# Patient Record
Sex: Male | Born: 1950 | Race: White | Hispanic: No | Marital: Single | Smoking: Never smoker
Health system: Southern US, Community
[De-identification: ages and names within clinical notes are randomized; demographics above are authoritative.]

## PROBLEM LIST (undated history)

## (undated) DIAGNOSIS — H5440 Blindness, one eye, unspecified eye: Secondary | ICD-10-CM

## (undated) DIAGNOSIS — C4491 Basal cell carcinoma of skin, unspecified: Secondary | ICD-10-CM

## (undated) DIAGNOSIS — C801 Malignant (primary) neoplasm, unspecified: Secondary | ICD-10-CM

## (undated) DIAGNOSIS — E119 Type 2 diabetes mellitus without complications: Secondary | ICD-10-CM

## (undated) HISTORY — PX: CHOLECYSTECTOMY: SHX55

## (undated) HISTORY — PX: TONSILLECTOMY: SUR1361

## (undated) HISTORY — DX: Basal cell carcinoma of skin, unspecified: C44.91

---

## 2003-06-13 ENCOUNTER — Other Ambulatory Visit: Payer: Self-pay

## 2004-02-27 ENCOUNTER — Ambulatory Visit: Payer: Self-pay | Admitting: *Deleted

## 2004-08-24 ENCOUNTER — Emergency Department: Payer: Self-pay | Admitting: Emergency Medicine

## 2005-01-03 ENCOUNTER — Emergency Department: Payer: Self-pay | Admitting: Emergency Medicine

## 2005-04-27 ENCOUNTER — Emergency Department: Payer: Self-pay | Admitting: Emergency Medicine

## 2005-12-15 ENCOUNTER — Emergency Department: Payer: Self-pay | Admitting: Emergency Medicine

## 2008-06-24 ENCOUNTER — Ambulatory Visit: Payer: Self-pay | Admitting: Gastroenterology

## 2009-05-29 DIAGNOSIS — D099 Carcinoma in situ, unspecified: Secondary | ICD-10-CM

## 2009-05-29 HISTORY — DX: Carcinoma in situ, unspecified: D09.9

## 2011-10-06 ENCOUNTER — Emergency Department: Payer: Self-pay | Admitting: Internal Medicine

## 2011-10-06 LAB — URINALYSIS, COMPLETE
Blood: NEGATIVE
Glucose,UR: NEGATIVE mg/dL (ref 0–75)
Hyaline Cast: 3
Leukocyte Esterase: NEGATIVE
Ph: 5 (ref 4.5–8.0)
RBC,UR: 5 /HPF (ref 0–5)
Specific Gravity: 1.026 (ref 1.003–1.030)
Squamous Epithelial: NONE SEEN
WBC UR: 2 /HPF (ref 0–5)

## 2011-10-06 LAB — COMPREHENSIVE METABOLIC PANEL
Albumin: 3.6 g/dL (ref 3.4–5.0)
Alkaline Phosphatase: 61 U/L (ref 50–136)
Anion Gap: 10 (ref 7–16)
BUN: 16 mg/dL (ref 7–18)
Bilirubin,Total: 0.9 mg/dL (ref 0.2–1.0)
Chloride: 102 mmol/L (ref 98–107)
Co2: 29 mmol/L (ref 21–32)
Glucose: 109 mg/dL — ABNORMAL HIGH (ref 65–99)
Potassium: 3.2 mmol/L — ABNORMAL LOW (ref 3.5–5.1)
SGPT (ALT): 84 U/L — ABNORMAL HIGH

## 2011-10-06 LAB — DRUG SCREEN, URINE
Amphetamines, Ur Screen: NEGATIVE (ref ?–1000)
Barbiturates, Ur Screen: NEGATIVE (ref ?–200)
Benzodiazepine, Ur Scrn: NEGATIVE (ref ?–200)
Cannabinoid 50 Ng, Ur ~~LOC~~: NEGATIVE (ref ?–50)
Cocaine Metabolite,Ur ~~LOC~~: NEGATIVE (ref ?–300)
MDMA (Ecstasy)Ur Screen: NEGATIVE (ref ?–500)
Opiate, Ur Screen: NEGATIVE (ref ?–300)
Phencyclidine (PCP) Ur S: NEGATIVE (ref ?–25)
Tricyclic, Ur Screen: NEGATIVE (ref ?–1000)

## 2011-10-06 LAB — CK TOTAL AND CKMB (NOT AT ARMC): CK, Total: 1166 U/L — ABNORMAL HIGH (ref 35–232)

## 2011-10-06 LAB — CBC
HCT: 39.4 % — ABNORMAL LOW (ref 40.0–52.0)
HGB: 13.3 g/dL (ref 13.0–18.0)
MCH: 30.2 pg (ref 26.0–34.0)
MCV: 90 fL (ref 80–100)
Platelet: 270 10*3/uL (ref 150–440)
RDW: 15.1 % — ABNORMAL HIGH (ref 11.5–14.5)

## 2011-10-06 LAB — SALICYLATE LEVEL: Salicylates, Serum: <1.7 mg/dL

## 2011-10-06 LAB — ETHANOL: Ethanol: <3 mg/dL

## 2011-10-06 LAB — PROTIME-INR: INR: 1

## 2011-10-06 LAB — ACETAMINOPHEN LEVEL: Acetaminophen: <2 ug/mL

## 2011-12-22 ENCOUNTER — Emergency Department: Payer: Self-pay | Admitting: *Deleted

## 2012-04-30 ENCOUNTER — Emergency Department: Payer: Self-pay | Admitting: Emergency Medicine

## 2012-04-30 LAB — URINALYSIS, COMPLETE
Bilirubin,UR: NEGATIVE
Blood: NEGATIVE
Leukocyte Esterase: NEGATIVE
Nitrite: NEGATIVE
Ph: 5 (ref 4.5–8.0)
Protein: NEGATIVE
RBC,UR: 6 /HPF (ref 0–5)
Specific Gravity: 1.027 (ref 1.003–1.030)
Squamous Epithelial: NONE SEEN

## 2012-04-30 LAB — LIPASE, BLOOD: Lipase: 28 U/L — ABNORMAL LOW (ref 73–393)

## 2012-04-30 LAB — COMPREHENSIVE METABOLIC PANEL
Alkaline Phosphatase: 84 U/L (ref 50–136)
Anion Gap: 7 (ref 7–16)
BUN: 15 mg/dL (ref 7–18)
Bilirubin,Total: 0.8 mg/dL (ref 0.2–1.0)
Calcium, Total: 9.2 mg/dL (ref 8.5–10.1)
Chloride: 106 mmol/L (ref 98–107)
Co2: 25 mmol/L (ref 21–32)
Creatinine: 1.21 mg/dL (ref 0.60–1.30)
EGFR (Non-African Amer.): >60
Osmolality: 277 (ref 275–301)
Potassium: 4 mmol/L (ref 3.5–5.1)
Sodium: 138 mmol/L (ref 136–145)

## 2012-04-30 LAB — CBC
HCT: 40.1 % (ref 40.0–52.0)
HGB: 14 g/dL (ref 13.0–18.0)
Platelet: 247 10*3/uL (ref 150–440)
RBC: 4.45 10*6/uL (ref 4.40–5.90)
RDW: 14.5 % (ref 11.5–14.5)

## 2012-07-19 LAB — COMPREHENSIVE METABOLIC PANEL
Anion Gap: 10 (ref 7–16)
BUN: 14 mg/dL (ref 7–18)
Co2: 24 mmol/L (ref 21–32)
Creatinine: 0.93 mg/dL (ref 0.60–1.30)
EGFR (African American): >60
EGFR (Non-African Amer.): >60
Osmolality: 279 (ref 275–301)
Potassium: 3.6 mmol/L (ref 3.5–5.1)
SGOT(AST): 37 U/L (ref 15–37)
SGPT (ALT): 40 U/L (ref 12–78)
Sodium: 139 mmol/L (ref 136–145)

## 2012-07-19 LAB — URINALYSIS, COMPLETE
Bacteria: NONE SEEN
Bilirubin,UR: NEGATIVE
Glucose,UR: NEGATIVE mg/dL (ref 0–75)
Ketone: NEGATIVE
Leukocyte Esterase: NEGATIVE
RBC,UR: <1 /HPF (ref 0–5)
Specific Gravity: 1.017 (ref 1.003–1.030)
WBC UR: 1 /HPF (ref 0–5)

## 2012-07-19 LAB — ETHANOL: Ethanol: <3 mg/dL

## 2012-07-19 LAB — CBC
HCT: 41.5 % (ref 40.0–52.0)
HGB: 13.9 g/dL (ref 13.0–18.0)
MCH: 31 pg (ref 26.0–34.0)
Platelet: 292 10*3/uL (ref 150–440)
RBC: 4.47 10*6/uL (ref 4.40–5.90)
WBC: 13 10*3/uL — ABNORMAL HIGH (ref 3.8–10.6)

## 2012-07-19 LAB — DRUG SCREEN, URINE
Amphetamines, Ur Screen: NEGATIVE (ref ?–1000)
Barbiturates, Ur Screen: NEGATIVE (ref ?–200)
Benzodiazepine, Ur Scrn: NEGATIVE (ref ?–200)
Methadone, Ur Screen: NEGATIVE (ref ?–300)
Opiate, Ur Screen: NEGATIVE (ref ?–300)

## 2012-07-19 LAB — TSH: Thyroid Stimulating Horm: 1.43 u[IU]/mL

## 2012-07-20 ENCOUNTER — Inpatient Hospital Stay: Payer: Self-pay | Admitting: Psychiatry

## 2012-08-01 ENCOUNTER — Emergency Department: Payer: Self-pay | Admitting: Internal Medicine

## 2013-10-24 ENCOUNTER — Emergency Department: Payer: Self-pay | Admitting: Emergency Medicine

## 2013-10-24 LAB — CBC
HCT: 47.7 % (ref 40.0–52.0)
HGB: 15.9 g/dL (ref 13.0–18.0)
MCH: 31.1 pg (ref 26.0–34.0)
MCHC: 33.4 g/dL (ref 32.0–36.0)
MCV: 93 fL (ref 80–100)
PLATELETS: 208 10*3/uL (ref 150–440)
RBC: 5.12 10*6/uL (ref 4.40–5.90)
RDW: 13.6 % (ref 11.5–14.5)
WBC: 8.9 10*3/uL (ref 3.8–10.6)

## 2013-10-24 LAB — COMPREHENSIVE METABOLIC PANEL
ALT: 45 U/L (ref 12–78)
Albumin: 3.8 g/dL (ref 3.4–5.0)
Alkaline Phosphatase: 65 U/L
Anion Gap: 6 — ABNORMAL LOW (ref 7–16)
BUN: 21 mg/dL — AB (ref 7–18)
Bilirubin,Total: 0.7 mg/dL (ref 0.2–1.0)
CO2: 26 mmol/L (ref 21–32)
Calcium, Total: 8.8 mg/dL (ref 8.5–10.1)
Chloride: 110 mmol/L — ABNORMAL HIGH (ref 98–107)
Creatinine: 1.11 mg/dL (ref 0.60–1.30)
EGFR (African American): >60
EGFR (Non-African Amer.): >60
Glucose: 104 mg/dL — ABNORMAL HIGH (ref 65–99)
OSMOLALITY: 286 (ref 275–301)
Potassium: 3.8 mmol/L (ref 3.5–5.1)
SGOT(AST): 36 U/L (ref 15–37)
Sodium: 142 mmol/L (ref 136–145)
TOTAL PROTEIN: 7.1 g/dL (ref 6.4–8.2)

## 2013-10-24 LAB — TROPONIN I: Troponin-I: <0.02 ng/mL

## 2013-10-24 LAB — ETHANOL
Ethanol %: <0.003 % (ref 0.000–0.080)
Ethanol: <3 mg/dL

## 2013-10-25 LAB — URINALYSIS, COMPLETE
BILIRUBIN, UR: NEGATIVE
BLOOD: NEGATIVE
GLUCOSE, UR: NEGATIVE mg/dL (ref 0–75)
Leukocyte Esterase: NEGATIVE
Nitrite: NEGATIVE
PH: 5 (ref 4.5–8.0)
Protein: 30
Specific Gravity: 1.035 (ref 1.003–1.030)
Squamous Epithelial: <1
WBC UR: 3 /HPF (ref 0–5)

## 2013-10-25 LAB — DRUG SCREEN, URINE

## 2013-10-28 ENCOUNTER — Emergency Department: Payer: Self-pay | Admitting: Emergency Medicine

## 2013-10-28 LAB — CBC
HCT: 44.1 % (ref 40.0–52.0)
HGB: 15.1 g/dL (ref 13.0–18.0)
MCH: 31.8 pg (ref 26.0–34.0)
MCHC: 34.2 g/dL (ref 32.0–36.0)
MCV: 93 fL (ref 80–100)
Platelet: 189 10*3/uL (ref 150–440)
RBC: 4.74 10*6/uL (ref 4.40–5.90)
RDW: 13.3 % (ref 11.5–14.5)
WBC: 6.7 10*3/uL (ref 3.8–10.6)

## 2013-10-28 LAB — COMPREHENSIVE METABOLIC PANEL
ALK PHOS: 63 U/L
ANION GAP: 7 (ref 7–16)
AST: 34 U/L (ref 15–37)
Albumin: 3.5 g/dL (ref 3.4–5.0)
BUN: 20 mg/dL — ABNORMAL HIGH (ref 7–18)
Bilirubin,Total: 0.6 mg/dL (ref 0.2–1.0)
CHLORIDE: 108 mmol/L — AB (ref 98–107)
Calcium, Total: 8.4 mg/dL — ABNORMAL LOW (ref 8.5–10.1)
Co2: 25 mmol/L (ref 21–32)
Creatinine: 1.17 mg/dL (ref 0.60–1.30)
EGFR (Non-African Amer.): >60
GLUCOSE: 100 mg/dL — AB (ref 65–99)
Osmolality: 282 (ref 275–301)
Potassium: 3.9 mmol/L (ref 3.5–5.1)
SGPT (ALT): 36 U/L (ref 12–78)
Sodium: 140 mmol/L (ref 136–145)
Total Protein: 6.6 g/dL (ref 6.4–8.2)

## 2013-10-28 LAB — PROTIME-INR
INR: 1
PROTHROMBIN TIME: 13.1 s (ref 11.5–14.7)

## 2013-10-28 LAB — ETHANOL
Ethanol %: <0.003 % (ref 0.000–0.080)
Ethanol: <3 mg/dL

## 2013-10-28 LAB — APTT: ACTIVATED PTT: 29.6 s (ref 23.6–35.9)

## 2013-10-28 LAB — TROPONIN I: Troponin-I: <0.02 ng/mL

## 2013-10-31 ENCOUNTER — Emergency Department: Payer: Self-pay | Admitting: Emergency Medicine
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
## 2014-09-18 NOTE — Discharge Summary (Signed)
PATIENT NAME:  Bradley Griffin, Bradley Griffin MR#:  [id number] DATE OF BIRTH:  24-Feb-1951  DATE OF ADMISSION:  07/20/2012 DATE OF DISCHARGE:  07/24/2012  HOSPITAL COURSE: See dictated history and physical for details of admission. This 64 year old man with a history of bipolar disorder with a fairly recent history of manic symptoms came into the hospital with at least hypomania. He had been off of his previously prescribed Risperdal and had noticed that he was starting to have euphoric mood and racing thoughts that he characterized as typical of when he was manic before. In the hospital, the patient had mildly pressured speech, euphoric affect, was somewhat intrusive and hyperverbal but was not threatening or hostile or suicidal. He was willing to take Risperdal but only 2 mg a day, which had been the previous dose prescribed when he had been manic before. The patient showed good tolerance of this medicine and gradually had at least partial improvement of his symptoms. Multiple times I met with him to educate him about the nature of bipolar disorder. I suggested to him that he ought to be on a more typical mood stabilizer and that he probably ought to be on a higher dose of the Risperdal. The patient rejected the idea of a higher dose of Risperdal but was ultimately agreeable to at least trying to start Depakote. Depakote 500 mg at night was initiated prior to discharge. During his hospital stay, the patient did not voice any suicidal ideation, was not aggressive or violent. He does not appear to be responding to internal stimuli, does not appear to be paranoid. He was completely in agreement with continuing to follow up with Dr. Timmothy Euler as an outpatient.   LABORATORY AND RADIOLOGICAL RESULTS: Drug screen negative. TSH 1.4, which is normal. Alcohol undetected. Chemistry normal except for a slightly high glucose at 118. CBC showed a white count elevated at 13; otherwise, CBC was normal. Urinalysis unremarkable. CT scan of the  head showed no evidence of infarct, ischemia or mass. Mild age-related white matter changes.   DISCHARGE MEDICATIONS: Risperdal 1 mg twice a day and Depakote 500 mg at night.   MENTAL STATUS EXAMINATION AT DISCHARGE: Somewhat slovenly dressed, not very well groomed man who looks his stated age or younger. Cooperative with the interview. Good eye contact. Speech normal in rate, tone and volume, if anything slightly increased. Affect was jovial and upbeat. Mood stated as good. Thoughts appear to be slightly scattered, but not bizarre. No grotesquely delusional statements made. Denied auditory or visual hallucinations. Denied suicidal or homicidal ideation. Showed at least adequate judgment and insight. Agreed to outpatient treatment. Intelligence normal. Short and long-term memory grossly intact.   DISPOSITION: He is discharged home where he lives on his own and will follow up with [place].   DIAGNOSES, PRINCIPAL AND PRIMARY:  AXIS I: Bipolar disorder type I, currently manic with partial remission.  AXIS II: Deferred.  AXIS III: Obesity.  AXIS IV: Moderate. Chronic stress from lack of social support.  AXIS V: Functioning at time of discharge: 60.    ____________________________ Gonzella Lex, MD jtc:jm D: 07/24/2012 16:23:10 ET T: 07/24/2012 19:51:03 ET JOB#: [id number]  cc: Gonzella Lex, MD, <Dictator> Gonzella Lex MD ELECTRONICALLY SIGNED 07/25/2012 16:38
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
## 2018-09-19 ENCOUNTER — Observation Stay
Admission: EM | Admit: 2018-09-19 | Discharge: 2018-09-20 | Disposition: A | Discharge status: Home / Self Care | Payer: Medicaid Other | Attending: Internal Medicine | Admitting: Internal Medicine

## 2018-09-19 ENCOUNTER — Other Ambulatory Visit: Payer: Self-pay

## 2018-09-19 ENCOUNTER — Encounter: Payer: Self-pay | Admitting: Emergency Medicine

## 2018-09-19 ENCOUNTER — Emergency Department: Payer: Medicaid Other

## 2018-09-19 DIAGNOSIS — Z66 Do not resuscitate: Secondary | ICD-10-CM | POA: Insufficient documentation

## 2018-09-19 DIAGNOSIS — R2681 Unsteadiness on feet: Secondary | ICD-10-CM | POA: Diagnosis not present

## 2018-09-19 DIAGNOSIS — E876 Hypokalemia: Secondary | ICD-10-CM | POA: Diagnosis not present

## 2018-09-19 DIAGNOSIS — Z85828 Personal history of other malignant neoplasm of skin: Secondary | ICD-10-CM | POA: Diagnosis not present

## 2018-09-19 DIAGNOSIS — M6281 Muscle weakness (generalized): Principal | ICD-10-CM | POA: Insufficient documentation

## 2018-09-19 DIAGNOSIS — R531 Weakness: Secondary | ICD-10-CM | POA: Diagnosis present

## 2018-09-19 DIAGNOSIS — E785 Hyperlipidemia, unspecified: Secondary | ICD-10-CM

## 2018-09-19 DIAGNOSIS — W19XXXA Unspecified fall, initial encounter: Secondary | ICD-10-CM | POA: Insufficient documentation

## 2018-09-19 DIAGNOSIS — E1165 Type 2 diabetes mellitus with hyperglycemia: Secondary | ICD-10-CM | POA: Diagnosis not present

## 2018-09-19 DIAGNOSIS — E1169 Type 2 diabetes mellitus with other specified complication: Secondary | ICD-10-CM

## 2018-09-19 DIAGNOSIS — Z9181 History of falling: Secondary | ICD-10-CM | POA: Diagnosis not present

## 2018-09-19 DIAGNOSIS — I959 Hypotension, unspecified: Secondary | ICD-10-CM | POA: Insufficient documentation

## 2018-09-19 HISTORY — DX: Malignant (primary) neoplasm, unspecified: C80.1

## 2018-09-19 LAB — CBC WITH DIFFERENTIAL/PLATELET
Abs Immature Granulocytes: 0.03 10*3/uL (ref 0.00–0.07)
Basophils Absolute: 0 10*3/uL (ref 0.0–0.1)
Basophils Relative: 0 %
Eosinophils Absolute: 0.1 10*3/uL (ref 0.0–0.5)
Eosinophils Relative: 2 %
HCT: 43.7 % (ref 39.0–52.0)
Hemoglobin: 15.2 g/dL (ref 13.0–17.0)
Immature Granulocytes: 0 %
Lymphocytes Relative: 16 %
Lymphs Abs: 1.5 10*3/uL (ref 0.7–4.0)
MCH: 31.6 pg (ref 26.0–34.0)
MCHC: 34.8 g/dL (ref 30.0–36.0)
MCV: 90.9 fL (ref 80.0–100.0)
Monocytes Absolute: 0.5 10*3/uL (ref 0.1–1.0)
Monocytes Relative: 6 %
Neutro Abs: 7.2 10*3/uL (ref 1.7–7.7)
Neutrophils Relative %: 76 %
Platelets: 197 10*3/uL (ref 150–400)
RBC: 4.81 MIL/uL (ref 4.22–5.81)
RDW: 13.1 % (ref 11.5–15.5)
WBC: 9.4 10*3/uL (ref 4.0–10.5)
nRBC: 0 % (ref 0.0–0.2)

## 2018-09-19 LAB — TROPONIN I: Troponin I: <0.03 ng/mL (ref <0.03)

## 2018-09-19 LAB — COMPREHENSIVE METABOLIC PANEL
ALT: 39 U/L (ref 0–44)
AST: 50 U/L — ABNORMAL HIGH (ref 15–41)
Albumin: 3.6 g/dL (ref 3.5–5.0)
Alkaline Phosphatase: 51 U/L (ref 38–126)
Anion gap: 17 — ABNORMAL HIGH (ref 5–15)
BUN: 11 mg/dL (ref 8–23)
CO2: 22 mmol/L (ref 22–32)
Calcium: 8.5 mg/dL — ABNORMAL LOW (ref 8.9–10.3)
Chloride: 98 mmol/L (ref 98–111)
Creatinine, Ser: 0.81 mg/dL (ref 0.61–1.24)
GFR calc Af Amer: >60 mL/min (ref >60)
GFR calc non Af Amer: >60 mL/min (ref >60)
Glucose, Bld: 318 mg/dL — ABNORMAL HIGH (ref 70–99)
Potassium: 3.3 mmol/L — ABNORMAL LOW (ref 3.5–5.1)
Sodium: 137 mmol/L (ref 135–145)
Total Bilirubin: 1.2 mg/dL (ref 0.3–1.2)
Total Protein: 6.7 g/dL (ref 6.5–8.1)

## 2018-09-19 LAB — BLOOD GAS, VENOUS
Acid-Base Excess: 7 mmol/L — ABNORMAL HIGH (ref 0.0–2.0)
Bicarbonate: 30.2 mmol/L — ABNORMAL HIGH (ref 20.0–28.0)
O2 Saturation: 25.4 %
Patient temperature: 37
pCO2, Ven: 37 mmHg — ABNORMAL LOW (ref 44.0–60.0)
pH, Ven: 7.52 — ABNORMAL HIGH (ref 7.250–7.430)
pO2, Ven: <31 mmHg — CL (ref 32.0–45.0)

## 2018-09-19 LAB — CK: Total CK: 91 U/L (ref 49–397)
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
## 2018-09-19 NOTE — ED Notes (Signed)
Date and time results received: 09/19/18 10:51 PM  (use smartphrase ".now" to insert current time)  Test: blood gas  Critical Value: ph;7.52 ; CO2 37; PO2 15; HCO3 30.2   Name of Provider Notified: Kinner MD   Orders Received? Or Actions Taken?: Actions Taken: MD Corky Downs notified of critical results

## 2018-09-19 NOTE — ED Notes (Signed)
Pet pt, weakness has been gradually getting worse within the last couple of weeks.

## 2018-09-19 NOTE — H&P (Signed)
[place] [facility] [place] NAME: Bradley Griffin    MR#:  [id number]  DATE OF BIRTH:  12/10/1950  DATE OF ADMISSION:  09/19/2018  PRIMARY CARE PHYSICIAN: Patient, No Pcp Per   REQUESTING/REFERRING PHYSICIAN: Lavonia Drafts, MD  CHIEF COMPLAINT:   Chief Complaint  Patient presents with  . Fall    HISTORY OF PRESENT ILLNESS:  Bradley Griffin  is a 68 y.o. male with a known history of skin cancer who presented to the ED with a fall and generalized weakness.  He states that his "feet got tripped up" today and he fell backwards.  He landed on his bottom.  He denies any head trauma or loss of consciousness.  He states that he has had generalized weakness over the last 3 weeks.  He is having a more difficult time going from sitting to standing.  He states he thinks this is because he has not been exercising as much.  He denies any chest pain, shortness of breath, abdominal pain.  In the ED, vitals were unremarkable.  Labs were significant for K3.3, anion gap 17.  Chest x-ray was unremarkable.  Due to his inability to walk in the emergency department, hospitalists were called for admission.  PAST MEDICAL HISTORY:   Past Medical History:  Diagnosis Date  . Cancer [facility])    Skin cancer     PAST SURGICAL HISTORY:   Past Surgical History:  Procedure Laterality Date  . CHOLECYSTECTOMY      SOCIAL HISTORY:   Social History   Tobacco Use  . Smoking status: Never Smoker  . Smokeless tobacco: Never Used  Substance Use Topics  . Alcohol use: Never    Frequency: Never    FAMILY HISTORY:  Mother-heart disease  DRUG ALLERGIES:  Not on File  REVIEW OF SYSTEMS:   Review of Systems  Constitutional: Negative for chills and fever.  HENT: Negative for congestion and sore throat.   Eyes: Negative for blurred vision and double vision.  Respiratory: Negative for cough and shortness of breath.   Cardiovascular: Negative for chest pain and  palpitations.  Gastrointestinal: Negative for nausea and vomiting.  Genitourinary: Negative for dysuria and urgency.  Musculoskeletal: Positive for falls. Negative for back pain and neck pain.  Neurological: Positive for weakness. Negative for dizziness, focal weakness and headaches.  Psychiatric/Behavioral: Negative for depression. The patient is not nervous/anxious.     MEDICATIONS AT HOME:   Prior to Admission medications   Not on File      VITAL SIGNS:  Blood pressure (!) 134/95, pulse 81, temperature 98 F (36.7 C), temperature source Oral, resp. rate 17, height 5' 9.5" (1.765 m), weight (!) 199.6 kg, SpO2 100 %.  PHYSICAL EXAMINATION:  Physical Exam  GENERAL:  68 y.o.-year-old patient lying in the bed with no acute distress.  EYES: Pupils equal, round, reactive to light and accommodation. No scleral icterus. Extraocular muscles intact.  HEENT: Head atraumatic, normocephalic. Oropharynx and nasopharynx clear.  NECK:  Supple, no jugular venous distention. No thyroid enlargement, no tenderness.  LUNGS: Normal breath sounds bilaterally, no wheezing, rales,rhonchi or crepitation. No use of accessory muscles of respiration.  CARDIOVASCULAR: RRR, S1, S2 normal. No murmurs, rubs, or gallops.  ABDOMEN: Soft, nontender, nondistended. Bowel sounds present. No organomegaly or mass.  EXTREMITIES: No pedal edema, cyanosis, or clubbing.  NEUROLOGIC: Cranial nerves II through XII are intact. +global weakness. Sensation intact. Gait not checked.  PSYCHIATRIC: The patient is alert and oriented x  3.  SKIN: No obvious rash. +chronic venous stasis changes in the lower extremities. +skin cancer located on right temple. See picture below.     LABORATORY PANEL:   CBC Recent Labs  Lab 09/19/18 2134  WBC 9.4  HGB 15.2  HCT 43.7  PLT 197   ------------------------------------------------------------------------------------------------------------------  Chemistries  Recent Labs  Lab  09/19/18 2134  NA 137  K 3.3*  CL 98  CO2 22  GLUCOSE 318*  BUN 11  CREATININE 0.81  CALCIUM 8.5*  AST 50*  ALT 39  ALKPHOS 51  BILITOT 1.2   ------------------------------------------------------------------------------------------------------------------  Cardiac Enzymes Recent Labs  Lab 09/19/18 2134  TROPONINI <0.03   ------------------------------------------------------------------------------------------------------------------  RADIOLOGY:  Dg Chest Port 1 View  Result Date: 09/19/2018 CLINICAL DATA:  Weakness EXAM: PORTABLE CHEST 1 VIEW COMPARISON:  10/28/2013 FINDINGS: The heart size and mediastinal contours are within normal limits. Both lungs are clear. The visualized skeletal structures are unremarkable. IMPRESSION: No active disease. Electronically Signed   By: Ulyses Jarred M.D.   On: 09/19/2018 22:01    IMPRESSION AND PLAN:   Generalized weakness- likely secondary to deconditioning. No focal deficits to suggest a stroke. -PT consult -Gentle IVFs  Hyperglycemia- no diagnosis of diabetes -Check A1c -SSI  Hypokalemia- K 3.3 -Replete and recheck  Skin cancer- has had skin cancers excised on his forehead before. Has not seen a dermatologist in while due to financial issues. -Needs to f/u with dermatology as an outpatient.  All the records are reviewed and case discussed with ED provider. Management plans discussed with the patient, family and they are in agreement.  CODE STATUS: DNR  TOTAL TIME TAKING CARE OF THIS PATIENT: 45 minutes.    Berna Spare Tallin Hart M.D on 09/19/2018 at 11:22 PM  Between 7am to 6pm - Pager [phone]  After 6pm go to [email or web address] - Proofreader  [facility] [place] [facility]  Office  [phone]  CC: Primary care physician; Patient, No Pcp Per   Note: This dictation was prepared with Dragon dictation along with smaller phrase technology. Any transcriptional errors that result from this process are  unintentional.

## 2018-09-19 NOTE — ED Notes (Signed)
ED Provider, [place]  at bedside.

## 2018-09-19 NOTE — ED Notes (Signed)
ED TO INPATIENT HANDOFF REPORT  ED Nurse Name and Phone #: Anette Guarneri Name/Age/Gender Bradley Griffin 68 y.o. male Room/Bed: ED05A/ED05A  Code Status   Code Status: Not on file  Home/SNF/Other Home Patient oriented to: situation Is this baseline? Yes   Triage Complete: Triage complete  Chief Complaint fall  Triage Note Pt from home via [place]. Per EMS, pt fell tonight, pt has been laying on floor for 4 hours, neighbors called EMS. Per pt, he lost balance "tripped over my own feet", denies dizziness, LOC, hitting his head. Pt c/o left hip pain for 1 week, per pt hip pain mainly when "I move". Pt A/Ox4. NAD noted at this time.     Allergies Not on File  Level of Care/Admitting Diagnosis ED Disposition    ED Disposition Condition [facility] Area: [place] [facility] [100120]  Level of Care: Med-Surg [16]  Covid Evaluation: N/A  Diagnosis: Generalized weakness [332951]  Admitting Physician: Hyman Bible DODD [[id number]]  Attending Physician: Hyman Bible DODD [[id number]]  PT Class (Do Not Modify): Observation [104]  PT Acc Code (Do Not Modify): Observation [10022]       B Medical/Surgery History Past Medical History:  Diagnosis Date  . Cancer [facility])    Skin cancer    Past Surgical History:  Procedure Laterality Date  . CHOLECYSTECTOMY       A IV Location/Drains/Wounds Patient Lines/Drains/Airways Status   Active Line/Drains/Airways    Name:   Placement date:   Placement time:   Site:   Days:   Peripheral IV 09/19/18 Right Antecubital   09/19/18    2141    Antecubital   less than 1          Intake/Output Last 24 hours No intake or output data in the 24 hours ending 09/19/18 2348  Labs/Imaging Results for orders placed or performed during the hospital encounter of 09/19/18 (from the past 48 hour(s))  CBC with Differential     Status: None   Collection Time: 09/19/18  9:34 PM  Result Value Ref Range   WBC 9.4 4.0 - 10.5 K/uL   RBC 4.81 4.22 - 5.81 MIL/uL   Hemoglobin 15.2 13.0 - 17.0 g/dL   HCT 43.7 39.0 - 52.0 %   MCV 90.9 80.0 - 100.0 fL   MCH 31.6 26.0 - 34.0 pg   MCHC 34.8 30.0 - 36.0 g/dL   RDW 13.1 11.5 - 15.5 %   Platelets 197 150 - 400 K/uL   nRBC 0.0 0.0 - 0.2 %   Neutrophils Relative % 76 %   Neutro Abs 7.2 1.7 - 7.7 K/uL   Lymphocytes Relative 16 %   Lymphs Abs 1.5 0.7 - 4.0 K/uL   Monocytes Relative 6 %   Monocytes Absolute 0.5 0.1 - 1.0 K/uL   Eosinophils Relative 2 %   Eosinophils Absolute 0.1 0.0 - 0.5 K/uL   Basophils Relative 0 %   Basophils Absolute 0.0 0.0 - 0.1 K/uL   Immature Granulocytes 0 %   Abs Immature Granulocytes 0.03 0.00 - 0.07 K/uL    Comment: Performed at [facility], [address] [place], [place] [address]  Comprehensive metabolic panel     Status: Abnormal   Collection Time: 09/19/18  9:34 PM  Result Value Ref Range   Sodium 137 135 - 145 mmol/L   Potassium 3.3 (L) 3.5 - 5.1 mmol/L   Chloride 98 98 - 111 mmol/L   CO2 22 22 -  32 mmol/L   Glucose, Bld 318 (H) 70 - 99 mg/dL   BUN 11 8 - 23 mg/dL   Creatinine, Ser 0.81 0.61 - 1.24 mg/dL   Calcium 8.5 (L) 8.9 - 10.3 mg/dL   Total Protein 6.7 6.5 - 8.1 g/dL   Albumin 3.6 3.5 - 5.0 g/dL   AST 50 (H) 15 - 41 U/L   ALT 39 0 - 44 U/L   Alkaline Phosphatase 51 38 - 126 U/L   Total Bilirubin 1.2 0.3 - 1.2 mg/dL   GFR calc non Af Amer >60 >60 mL/min   GFR calc Af Amer >60 >60 mL/min   Anion gap 17 (H) 5 - 15    Comment: Performed at [facility], [address] [place], [place] [address]  CK     Status: None   Collection Time: 09/19/18  9:34 PM  Result Value Ref Range   Total CK 91 49 - 397 U/L    Comment: Performed at [facility], [address] [place], [place] [address]  Troponin I - ONCE - STAT     Status: None   Collection Time: 09/19/18  9:34 PM  Result Value Ref Range   Troponin I <0.03 <0.03 ng/mL    Comment: Performed at [facility], [address]  [place], [place] [address]  Blood gas, venous     Status: Abnormal   Collection Time: 09/19/18 10:31 PM  Result Value Ref Range   pH, Ven 7.52 (H) 7.250 - 7.430   pCO2, Ven 37 (L) 44.0 - 60.0 mmHg   pO2, Ven <31.0 (LL) 32.0 - 45.0 mmHg    Comment: CRITICAL RESULT CALLED TO, READ BACK BY AND VERIFIED WITH: A.JESSICA RN AT 2249 ON [id number] BU=Y SMATHEW RRT    Bicarbonate 30.2 (H) 20.0 - 28.0 mmol/L   Acid-Base Excess 7.0 (H) 0.0 - 2.0 mmol/L   O2 Saturation 25.4 %   Patient temperature 37.0    Collection site VENOUS    Sample type VENOUS     Comment: Performed at [facility], [address] [place], [place] [address]   Dg Chest Port 1 View  Result Date: 09/19/2018 CLINICAL DATA:  Weakness EXAM: PORTABLE CHEST 1 VIEW COMPARISON:  10/28/2013 FINDINGS: The heart size and mediastinal contours are within normal limits. Both lungs are clear. The visualized skeletal structures are unremarkable. IMPRESSION: No active disease. Electronically Signed   By: Ulyses Jarred M.D.   On: 09/19/2018 22:01    Pending Labs Unresulted Labs (From admission, onward)    Start     Ordered   09/19/18 2138  Urinalysis, Complete w Microscopic  Once,   STAT     09/19/18 2137   Signed and Held  HIV antibody (Routine Testing)  Once,   R     Signed and Held   Signed and Held  CBC  Tomorrow morning,   R     Signed and Held   Signed and Held  Hemoglobin A1c  Once,   R     Signed and Held   Signed and Held  TSH  Once,   R     Signed and Held   Signed and Held  Comprehensive metabolic panel  Tomorrow morning,   R     Signed and Held          Vitals/Pain Today's Vitals   09/19/18 2129 09/19/18 2130 09/19/18 2200 09/19/18 2230  BP:  136/84 (!) 144/97 (!) 134/95  Pulse:  91 80 81  Resp:  20 15 17   Temp:      TempSrc:      SpO2:  100% 100% 100%  Weight: (!) 199.6 kg     Height: 5' 9.5" (1.765 m)     PainSc:        Isolation Precautions No active isolations  Medications Medications - No data  to display  Mobility Pt is here for falls and needs assistance with walking  Moderate fall risk   Focused Assessments    R Recommendations: See Admitting Provider Note  Report given to:   Additional Notes:

## 2018-09-19 NOTE — Progress Notes (Signed)
Family Meeting Note  Advance Directive:no  Today a meeting took place with the Patient.  Patient is able to participate.  The following clinical team members were present during this meeting:MD  The following were discussed:Patient's diagnosis: generalized weakness, Patient's progosis: Unable to determine and Goals for treatment: DNR  Additional follow-up to be provided: prn  Time spent during discussion:20 minutes  Evette Doffing, MD

## 2018-09-19 NOTE — ED Provider Notes (Signed)
[facility] Emergency Department Provider Note   ____________________________________________    I have reviewed the triage vital signs and the nursing notes.   HISTORY  Chief Complaint Fall     HPI Bradley Griffin is a 68 y.o. male who presents after a fall.  Patient reports he fell this afternoon and was unable to get up.  He reports he was down for approximately 4 to 5 hours.  He reports he is becoming gradually weaker over the last several months, culminating in his fall today.  He denies injury from the fall.  Denies blood thinners.  No back pain hip pain or extremity injuries.  No chest wall pain no abdominal pain nausea or vomiting.  No neck pain.  Past Medical History:  Diagnosis Date  . Cancer [facility])    Skin cancer     There are no active problems to display for this patient.   Past Surgical History:  Procedure Laterality Date  . CHOLECYSTECTOMY      Prior to Admission medications   Not on File     Allergies Patient has no allergy information on record.  History reviewed. No pertinent family history.  Social History Social History   Tobacco Use  . Smoking status: Never Smoker  . Smokeless tobacco: Never Used  Substance Use Topics  . Alcohol use: Never    Frequency: Never  . Drug use: Never    Review of Systems  Constitutional: No fever/chills Eyes: No visual changes.  ENT: No neck pain Cardiovascular: Denies chest pain. Respiratory: Denies shortness of breath. Gastrointestinal: No abdominal pain.  No nausea, no vomiting.   Genitourinary: Negative for dysuria. Musculoskeletal: Occasional left hip pain, prior to this fall Skin: Skin cancer to the forehead Neurological: Negative for headaches    ____________________________________________   PHYSICAL EXAM:  VITAL SIGNS: ED Triage Vitals  Enc Vitals Group     BP 09/19/18 2127 (!) 153/85     Pulse Rate 09/19/18 2127 89     Resp 09/19/18 2127 17     Temp  09/19/18 2127 98 F (36.7 C)     Temp Source 09/19/18 2127 Oral     SpO2 09/19/18 2127 100 %     Weight 09/19/18 2129 (!) 199.6 kg (440 lb)     Height 09/19/18 2129 1.765 m (5' 9.5")     Head Circumference --      Peak Flow --      Pain Score 09/19/18 2128 4     Pain Loc --      Pain Edu? --      Excl. in Energy? --     Constitutional: Alert and oriented. No acute distress. Eyes: Conjunctivae are normal.  Head: Atraumatic.  Ulceration/wound to the right temple area which patient states is skin cancer Nose: No congestion/rhinnorhea. Mouth/Throat: Mucous membranes are moist.   Neck:  Painless ROM Cardiovascular: Normal rate, regular rhythm. Grossly normal heart sounds.  Good peripheral circulation. Respiratory: Normal respiratory effort.  No retractions. Lungs CTAB. Gastrointestinal: Soft and nontender. No distention.   Musculoskeletal: Full range of motion of all extremities, no pain with axial load on both hips.  No chest wall tenderness palpation.  No vertebral tenderness to palpation Neurologic:  Normal speech and language. No gross focal neurologic deficits are appreciated.  Skin:  Skin is warm, dry and intact. No rash noted. Psychiatric: Mood and affect are normal. Speech and behavior are normal.  ____________________________________________   LABS (all labs ordered  are listed, but only abnormal results are displayed)  Labs Reviewed  COMPREHENSIVE METABOLIC PANEL - Abnormal; Notable for the following components:      Result Value   Potassium 3.3 (*)    Glucose, Bld 318 (*)    Calcium 8.5 (*)    AST 50 (*)    Anion gap 17 (*)    All other components within normal limits  BLOOD GAS, VENOUS - Abnormal; Notable for the following components:   pH, Ven 7.52 (*)    pCO2, Ven 37 (*)    pO2, Ven <31.0 (*)    Bicarbonate 30.2 (*)    Acid-Base Excess 7.0 (*)    All other components within normal limits  CBC WITH DIFFERENTIAL/PLATELET  CK  TROPONIN I  URINALYSIS, COMPLETE  (UACMP) WITH MICROSCOPIC   ____________________________________________  EKG  None ____________________________________________  RADIOLOGY  Chest x-ray unremarkable ____________________________________________   PROCEDURES  Procedure(s) performed: No  Procedures   Critical Care performed: No ____________________________________________   INITIAL IMPRESSION / ASSESSMENT AND PLAN / ED COURSE  Pertinent labs & imaging results that were available during my care of the patient were reviewed by me and considered in my medical decision making (see chart for details).  Patient presents with complaints of fall and weakness.  Appears that his weakness has been gradually getting worse to the point now where he is having severe difficulty ambulating.  No evidence of injury on exam.  Will check labs, CK, CBC, chemistries, chest x-ray and reevaluate.  Elevated glucose with mildly elevated anion gap but bicarb appears normal, will add on VBG  VBG demonstrates mild alkalosis possibly compensatory, possibly compensatory habits cussed with the hospitalist for admission given patient's inability to ambulate    ____________________________________________   FINAL CLINICAL IMPRESSION(S) / ED DIAGNOSES  Final diagnoses:  Fall, initial encounter  Generalized weakness        Note:  This document was prepared using Dragon voice recognition software and may include unintentional dictation errors.   Lavonia Drafts, MD 09/19/18 [phone]

## 2018-09-20 LAB — CBC
HCT: 43 % (ref 39.0–52.0)
Hemoglobin: 14.9 g/dL (ref 13.0–17.0)
MCH: 31.5 pg (ref 26.0–34.0)
MCHC: 34.7 g/dL (ref 30.0–36.0)
MCV: 90.9 fL (ref 80.0–100.0)
Platelets: 212 10*3/uL (ref 150–400)
RBC: 4.73 MIL/uL (ref 4.22–5.81)
RDW: 12.9 % (ref 11.5–15.5)
WBC: 11.2 10*3/uL — ABNORMAL HIGH (ref 4.0–10.5)
nRBC: 0 % (ref 0.0–0.2)

## 2018-09-20 LAB — URINALYSIS, COMPLETE (UACMP) WITH MICROSCOPIC
Bacteria, UA: NONE SEEN
Bilirubin Urine: NEGATIVE
Glucose, UA: 50 mg/dL — AB
Hgb urine dipstick: NEGATIVE
Ketones, ur: 20 mg/dL — AB
Leukocytes,Ua: NEGATIVE
Nitrite: NEGATIVE
Protein, ur: 30 mg/dL — AB
Specific Gravity, Urine: 1.025 (ref 1.005–1.030)
pH: 6 (ref 5.0–8.0)

## 2018-09-20 LAB — HEMOGLOBIN A1C
Hgb A1c MFr Bld: 10.7 % — ABNORMAL HIGH (ref 4.8–5.6)
Mean Plasma Glucose: 260.39 mg/dL

## 2018-09-20 LAB — COMPREHENSIVE METABOLIC PANEL
ALT: 37 U/L (ref 0–44)
AST: 38 U/L (ref 15–41)
Albumin: 3.5 g/dL (ref 3.5–5.0)
Alkaline Phosphatase: 53 U/L (ref 38–126)
Anion gap: 10 (ref 5–15)
BUN: 11 mg/dL (ref 8–23)
CO2: 27 mmol/L (ref 22–32)
Calcium: 8.6 mg/dL — ABNORMAL LOW (ref 8.9–10.3)
Chloride: 103 mmol/L (ref 98–111)
Creatinine, Ser: 0.73 mg/dL (ref 0.61–1.24)
GFR calc Af Amer: >60 mL/min (ref >60)
GFR calc non Af Amer: >60 mL/min (ref >60)
Glucose, Bld: 258 mg/dL — ABNORMAL HIGH (ref 70–99)
Potassium: 3.1 mmol/L — ABNORMAL LOW (ref 3.5–5.1)
Sodium: 140 mmol/L (ref 135–145)
Total Bilirubin: 1.5 mg/dL — ABNORMAL HIGH (ref 0.3–1.2)
Total Protein: 6.6 g/dL (ref 6.5–8.1)

## 2018-09-20 LAB — MAGNESIUM: Magnesium: 1.7 mg/dL (ref 1.7–2.4)

## 2018-09-20 LAB — GLUCOSE, CAPILLARY
Glucose-Capillary: 266 mg/dL — ABNORMAL HIGH (ref 70–99)
Glucose-Capillary: 263 mg/dL — ABNORMAL HIGH (ref 70–99)
Glucose-Capillary: 255 mg/dL — ABNORMAL HIGH (ref 70–99)

## 2018-09-20 LAB — TSH: TSH: 1.696 u[IU]/mL (ref 0.350–4.500)

## 2018-09-20 MED ORDER — ENOXAPARIN SODIUM 40 MG/0.4ML ~~LOC~~ SOLN
40.0000 mg | Freq: Two times a day (BID) | SUBCUTANEOUS | Status: DC
  Administered 2018-09-20: 40 mg via SUBCUTANEOUS
  Filled 2018-09-20: qty 0.4

## 2018-09-20 MED ORDER — INSULIN ASPART 100 UNIT/ML ~~LOC~~ SOLN
0.0000 [IU] | Freq: Every day | SUBCUTANEOUS | Status: DC
  Administered 2018-09-20: 3 [IU] via SUBCUTANEOUS
  Filled 2018-09-20: qty 1

## 2018-09-20 MED ORDER — ONDANSETRON HCL 4 MG PO TABS: 4.0000 mg | ORAL_TABLET | Freq: Four times a day (QID) | ORAL | Status: DC | PRN

## 2018-09-20 MED ORDER — ACETAMINOPHEN 650 MG RE SUPP: 650.0000 mg | Freq: Four times a day (QID) | RECTAL | Status: DC | PRN

## 2018-09-20 MED ORDER — METFORMIN HCL 500 MG PO TABS
1000.0000 mg | ORAL_TABLET | Freq: Two times a day (BID) | ORAL | Status: DC
  Administered 2018-09-20: 1000 mg via ORAL
  Filled 2018-09-20 (×2): qty 2

## 2018-09-20 MED ORDER — POLYETHYLENE GLYCOL 3350 17 G PO PACK: 17.0000 g | PACK | Freq: Every day | ORAL | Status: DC | PRN

## 2018-09-20 MED ORDER — ACETAMINOPHEN 325 MG PO TABS: 650.0000 mg | ORAL_TABLET | Freq: Four times a day (QID) | ORAL | Status: DC | PRN

## 2018-09-20 MED ORDER — LIVING WELL WITH DIABETES BOOK
Freq: Once | Status: AC
  Administered 2018-09-20: 13:00:00

## 2018-09-20 MED ORDER — POTASSIUM CHLORIDE CRYS ER 20 MEQ PO TBCR
40.0000 meq | EXTENDED_RELEASE_TABLET | ORAL | Status: AC
  Administered 2018-09-20 (×2): 40 meq via ORAL
  Filled 2018-09-20 (×2): qty 2

## 2018-09-20 MED ORDER — SODIUM CHLORIDE 0.9 % IV SOLN
INTRAVENOUS | Status: DC
  Administered 2018-09-20: 01:00:00 via INTRAVENOUS

## 2018-09-20 MED ORDER — INSULIN ASPART 100 UNIT/ML ~~LOC~~ SOLN
0.0000 [IU] | Freq: Three times a day (TID) | SUBCUTANEOUS | Status: DC
  Administered 2018-09-20 (×2): 5 [IU] via SUBCUTANEOUS
  Filled 2018-09-20 (×2): qty 1

## 2018-09-20 MED ORDER — POTASSIUM CHLORIDE CRYS ER 20 MEQ PO TBCR
40.0000 meq | EXTENDED_RELEASE_TABLET | Freq: Once | ORAL | Status: AC
  Administered 2018-09-20: 40 meq via ORAL
  Filled 2018-09-20: qty 2

## 2018-09-20 MED ORDER — ONDANSETRON HCL 4 MG/2ML IJ SOLN: 4.0000 mg | Freq: Four times a day (QID) | INTRAMUSCULAR | Status: DC | PRN

## 2018-09-20 MED ORDER — METFORMIN HCL 1000 MG PO TABS: 1000.0000 mg | ORAL_TABLET | Freq: Two times a day (BID) | ORAL | 2 refills | Status: DC

## 2018-09-20 MED ORDER — SODIUM CHLORIDE 0.9 % IV BOLUS
1000.0000 mL | Freq: Once | INTRAVENOUS | Status: AC
  Administered 2018-09-20: 12:00:00 1000 mL via INTRAVENOUS

## 2018-09-20 NOTE — TOC Initial Note (Signed)
Transition of Care [facility]) - Initial/Assessment Note    Patient Details  Name: Bradley Griffin MRN: [id number] Date of Birth: 07-28-1950  Transition of [facility] CM/SW Contact:    Su Hilt, RN Phone Number: 09/20/2018, 10:05 AM  Clinical Narrative:                 Met with the patient to discuss DC plan and needs He lives alone in an apartment and has no one to help care for him He stated that he has no insurance and no income, I asked him if he has thought about applying for Medicare and he stated that it is not worth it, I asked him wouldn't some financial assistance be better than none, he said not really.  I asked him if he has a home, he said yes he has an apartment and that he has paid his rent thru May.  I asked him if he has applied for foodstamps ad he stated that he had them before but it is not worth it to apply again, I encouraged him to reapply if he no longer gets the food stamps.  He stated that he would consider it.  I asked the patient if a bariatric walker would fit in his apartment, he stated that it would I requested a bariatric walker with [employer] thru [employer] and asked for charity He agreed to use [facility] PT and SW but stated his apartment is a mess due to not being able to bend over any longer. I requested [facility] [place] for PT and SW I [facility] a [facility] email to [employer] and [employer] as the patient does not have a PCP He states that he provides his own transportation and still drives He does not take medications.  I asked him if he needed medications how would he get them, he said he wouldn't.  I asked him if he would get them if we could provide them thru [facility] at no cost, he said he might.  Continue to monitor for medication needs  Expected Discharge Plan: [place] Barriers to Discharge: Continued Medical Work up   Patient Goals and CMS Choice Patient states their goals for this  hospitalization and ongoing recovery are:: to go home      Expected Discharge Plan and Services Expected Discharge Plan: [place]   Discharge Planning Services: CM Consult   Living arrangements for the past 2 months: Apartment                 DME Arranged: [facility] Rile rolling(bariatric) DME Agency: [employer](thru charity) Date DME Agency Contacted: 09/20/18 Time DME Agency Contacted: [id number] Representative spoke with at DME Agency: [employer] thru Email HH Arranged: PT, Social Work [employer] Agency: [facility] [employer] [facility] [employer] Date [place]: 09/20/18 Time [place]: [phone] Representative spoke with at [place] thru email  Prior Living Arrangements/Services Living arrangements for the past 2 months: Apartment Lives with:: Self Patient language and need for interpreter reviewed:: No Do you feel safe going back to the place where you live?: Yes      Need for Family Participation in Patient Care: No (Comment) Care giver support system in place?: No (comment)   Criminal Activity/Legal Involvement Pertinent to Current Situation/Hospitalization: No - Comment as needed  Activities of Daily Living Home Assistive Devices/Equipment: None ADL Screening (condition at time of admission) Patient's cognitive  ability adequate to safely complete daily activities?: Yes Is the patient deaf or have difficulty hearing?: No Does the patient have difficulty seeing, even when wearing glasses/contacts?: No Does the patient have difficulty concentrating, remembering, or making decisions?: No Patient able to express need for assistance with ADLs?: Yes Does the patient have difficulty dressing or bathing?: Yes Independently performs ADLs?: Yes (appropriate for developmental age) Does the patient have difficulty walking or climbing stairs?: Yes Weakness of Legs: None Weakness of Arms/Hands: None  Permission Sought/Granted Permission sought to share  information with : Case Manager Permission granted to share information with : Yes, Verbal Permission Granted     Permission granted to share info w AGENCY: [place] agency        Emotional Assessment Appearance:: Appears older than stated age Attitude/Demeanor/Rapport: Engaged Affect (typically observed): Accepting Orientation: : Oriented to Self, Oriented to Place, Oriented to  Time, Oriented to Situation Alcohol / Substance Use: Never Used Psych Involvement: No (comment)  Admission diagnosis:  Generalized weakness [R53.1] Fall, initial encounter [W19.XXXA] Patient Active Problem List   Diagnosis Date Noted  . Generalized weakness 09/19/2018   PCP:  Patient, No Pcp Per Pharmacy:  No Pharmacies Listed    Social Determinants of Health (SDOH) Interventions    Readmission Risk Interventions No flowsheet data found.

## 2018-09-20 NOTE — Progress Notes (Addendum)
Inpatient Diabetes Program Recommendations  AACE/ADA: New Consensus Statement on Inpatient Glycemic Control (2015)  Target Ranges:  Prepandial:   less than 140 mg/dL      Peak postprandial:   less than 180 mg/dL (1-2 hours)      Critically ill patients:  140 - 180 mg/dL   Lab Results  Component Value Date   GLUCAP 255 (H) 09/20/2018   HGBA1C 10.7 (H) 09/19/2018    Review of Glycemic Control Results for Bradley Griffin, Bradley Griffin (MRN [id number]) as of 09/20/2018 12:11  Ref. Range 09/20/2018 01:17 09/20/2018 07:41 09/20/2018 11:47  Glucose-Capillary Latest Ref Range: 70 - 99 mg/dL 266 (H) 263 (H) 255 (H)   Diabetes history: New diagnosis of DM?  Results for Bradley Griffin, Bradley Griffin (MRN [id number]) as of 09/20/2018 12:11  Ref. Range 09/19/2018 21:34  Hemoglobin A1C Latest Ref Range: 4.8 - 5.6 % 10.7 (H)   Outpatient Diabetes medications: None Current orders for Inpatient glycemic control:  Novolog sensitive tid with meals and HS Metformin 1000 mg bid Inpatient Diabetes Program Recommendations:    Potential new diagnosis of DM? A1C>10%.  Note start of Metformin. Likely need to talk to patient regarding DM and new diagnosis. Will order LWWD booklet for patient as well.   Addendum:    Spoke with patient by phone and discussed new diagnosis of DM.  He states that his blood sugars have been high in the past but he's never been told he has DM.  We reviewed what DM is- Type 2 DM and insulin resistance.  Further reviewed normal blood sugar values of 80-130 mg/dL.  Discussed metformin and that is should be taken with food.  Patient admits to drinking "lots" of soda. Discussed role of CHO in increasing blood sugars and portion control. We discussed replacing soda with water to improve blood sugars.  He has been ordered glucose meter.  Will have NT review proper technique for checking CBG's.  Also discussed importance of f/u with PCP- which is being set-up for him post hospitalization.  Patient was able to teach back  information. States he has no questions at this time. Will need very close follow-up with PCP.  Thanks,  Adah Perl, RN, BC-ADM Inpatient Diabetes Coordinator Pager [phone] (8a-5p)

## 2018-09-20 NOTE — Progress Notes (Signed)
Pt left via taxi at 2008. No signs of distress noted at discharge.

## 2018-09-20 NOTE — TOC Progression Note (Signed)
Transition of Care [facility]) - Progression Note    Patient Details  Name: Bradley Griffin MRN: [id number] Date of Birth: Aug 16, 1950  Transition of Care [facility]) CM/SW [place], RN Phone Number: 09/20/2018, 12:21 PM  Clinical Narrative:     Damaris Schooner with Langley Adie with Medication Management at [phone] to inquire about Med assistance for Metformin, she stated that they will help the patient, I will pick up the medication after lunch when they reopen Checking to see if we can provide a glucometer for him to check his BS, Med Mgt do not provide, [facility] but not open at this time to take patients to provide him with a glucometer  A taxi voucher will be provided at DC to get the patient home since he arrived via EMS  Expected Discharge Plan: [place] Barriers to Discharge: Continued Medical Work up  Expected Discharge Plan and Services Expected Discharge Plan: [place]   Discharge Planning Services: CM Consult   Living arrangements for the past 2 months: Apartment Expected Discharge Date: 09/20/18               DME Arranged: Gilford Rile rolling(bariatric) DME Agency: [employer](thru charity) Date DME Agency Contacted: 09/20/18 Time DME Agency Contacted: [phone] Representative spoke with at DME Agency: Leroy Sea thru Email HH Arranged: PT, Social Work [employer] Agency: [facility] [facility] [employer] Date [place]: 09/20/18 Time [place]: [place] Representative spoke with at [place]: Helene Kelp thru email   Social Determinants of Health ([place]) Interventions    Readmission Risk Interventions No flowsheet data found.

## 2018-09-20 NOTE — Progress Notes (Signed)
   09/20/18 1140  Therapy Vitals  Patient Position (if appropriate) Orthostatic Vitals  Orthostatic Lying   BP- Lying 124/69  Pulse- Lying 93  Orthostatic Sitting  BP- Sitting 122/72  Pulse- Sitting 101  Orthostatic Standing at 0 minutes  BP- Standing at 0 minutes 98/77  Pulse- Standing at 0 minutes 115  Orthostatic Standing at 3 minutes  BP- Standing at 3 minutes  (does not tolerate 3 minutes)   Janna Arch, PT, DPT

## 2018-09-20 NOTE — Discharge Summary (Signed)
[place] [facility] [place] NAME: Bradley Griffin    MR#:  [id number]  DATE OF BIRTH:  07/12/1950  DATE OF ADMISSION:  09/19/2018   ADMITTING PHYSICIAN: Sela Hua, MD  DATE OF DISCHARGE:  09/20/18  PRIMARY CARE PHYSICIAN: Patient, No Pcp Per   ADMISSION DIAGNOSIS:   Generalized weakness [R53.1] Fall, initial encounter [W19.XXXA]  DISCHARGE DIAGNOSIS:   Active Problems:   Generalized weakness   SECONDARY DIAGNOSIS:   Past Medical History:  Diagnosis Date  . Cancer [facility])    Skin cancer     HOSPITAL COURSE:   68 year old male with past medical history significant for skin cancer who stays at home by himself came in after a fall and Generalized weakness.  1. Fall- orthostatics showing hypotension.  Patient received IV fluids -Urine analysis negative for any infection,   2. Hypokalemia-replaced  3.  New onset diabetes mellitus-patient noted to have hyperglycemia, urine analysis showing proteinuria and ketones -Received IV fluids.  Started on metformin.  A1c greater than 10 -Patient has no plan for meals.  We will hold off on adding sulfonylureas in case he becomes hypoglycemic -Glucometer, [facility]. -Blood pressure is very soft, so not adding any ACE inhibitor or ARB at this time.  Ambulated with physical therapy.  Will be discharged home with home health  DISCHARGE CONDITIONS:   Guarded  CONSULTS OBTAINED:   None  DRUG ALLERGIES:   Not on File DISCHARGE MEDICATIONS:   Allergies as of 09/20/2018   Not on File     Medication List    TAKE these medications   metFORMIN 1000 MG tablet Commonly known as:  GLUCOPHAGE Take 1 tablet (1,000 mg total) by mouth 2 (two) times daily with a meal.            Durable Medical Equipment  (From admission, onward)         Start     Ordered   09/20/18 1247  For home use only DME Walker  Once    Question:  Patient needs a walker to treat with the  following condition  Answer:  Fall   09/20/18 1246           DISCHARGE INSTRUCTIONS:   1. PCP f/u in 1 week  DIET:   Diabetic diet  ACTIVITY:   Activity as tolerated  OXYGEN:   Home Oxygen: No.  Oxygen Delivery: room air  DISCHARGE LOCATION:   home   If you experience worsening of your admission symptoms, develop shortness of breath, life threatening emergency, suicidal or homicidal thoughts you must seek medical attention immediately by calling 911 or calling your MD immediately  if symptoms less severe.  You Must read complete instructions/literature along with all the possible adverse reactions/side effects for all the Medicines you take and that have been prescribed to you. Take any new Medicines after you have completely understood and accpet all the possible adverse reactions/side effects.   Please note  You were cared for by a hospitalist during your hospital stay. If you have any questions about your discharge medications or the care you received while you were in the hospital after you are discharged, you can call the unit and asked to speak with the hospitalist on call if the hospitalist that took care of you is not available. Once you are discharged, your primary care physician will handle any further medical issues. Please note that NO REFILLS for any discharge medications will be  authorized once you are discharged, as it is imperative that you return to your primary care physician (or establish a relationship with a primary care physician if you do not have one) for your aftercare needs so that they can reassess your need for medications and monitor your lab values.    On the day of Discharge:  VITAL SIGNS:   Blood pressure 125/80, pulse 89, temperature 98.9 F (37.2 C), temperature source Oral, resp. rate (!) 22, height 5\' 9"  (1.753 m), weight (!) 137.8 kg, SpO2 98 %.  PHYSICAL EXAMINATION:    GENERAL:  68 y.o.-year-old patient lying in the bed with no  acute distress.  EYES: Pupils equal, round, reactive to light and accommodation. No scleral icterus. Extraocular muscles intact.  HEENT: Head atraumatic, normocephalic. Oropharynx and nasopharynx clear.  NECK:  Supple, no jugular venous distention. No thyroid enlargement, no tenderness.  LUNGS: Normal breath sounds bilaterally, no wheezing, rales,rhonchi or crepitation. No use of accessory muscles of respiration.  CARDIOVASCULAR: S1, S2 normal. No  rubs, or gallops. 2/6 systolic murmur present ABDOMEN: Soft, non-tender, non-distended. Bowel sounds present. No organomegaly or mass.  EXTREMITIES: No pedal edema, cyanosis, or clubbing.  Dry patches with signs of scratching noted on left arm NEUROLOGIC: Cranial nerves II through XII are intact. Muscle strength 5/5 in all extremities. Sensation intact. Gait not checked.  PSYCHIATRIC: The patient is alert and oriented x 3.  SKIN: lateral to right eye- skin cancer resection with changes         DATA REVIEW:   CBC Recent Labs  Lab 09/20/18 0345  WBC 11.2*  HGB 14.9  HCT 43.0  PLT 212    Chemistries  Recent Labs  Lab 09/20/18 0345  NA 140  K 3.1*  CL 103  CO2 27  GLUCOSE 258*  BUN 11  CREATININE 0.73  CALCIUM 8.6*  MG 1.7  AST 38  ALT 37  ALKPHOS 53  BILITOT 1.5*     Microbiology Results  No results found for this or any previous visit.  RADIOLOGY:  Dg Chest Port 1 View  Result Date: 09/19/2018 CLINICAL DATA:  Weakness EXAM: PORTABLE CHEST 1 VIEW COMPARISON:  10/28/2013 FINDINGS: The heart size and mediastinal contours are within normal limits. Both lungs are clear. The visualized skeletal structures are unremarkable. IMPRESSION: No active disease. Electronically Signed   By: Ulyses Jarred M.D.   On: 09/19/2018 22:01     Management plans discussed with the patient, family and they are in agreement.  CODE STATUS:     Code Status Orders  (From admission, onward)         Start     Ordered   09/20/18 0046   Do not attempt resuscitation (DNR)  Continuous    Question Answer Comment  In the event of cardiac or respiratory ARREST Do not call a "code blue"   In the event of cardiac or respiratory ARREST Do not perform Intubation, CPR, defibrillation or ACLS   In the event of cardiac or respiratory ARREST Use medication by any route, position, wound care, and other measures to relive pain and suffering. May use oxygen, suction and manual treatment of airway obstruction as needed for comfort.      09/20/18 0045        Code Status History    This patient has a current code status but no historical code status.      TOTAL TIME TAKING CARE OF THIS PATIENT: 38 minutes.    Kandis Nab.D  on 09/20/2018 at 2:44 PM  Between 7am to 6pm - Pager - [phone]  After 6pm go to [email or web address] - Proofreader  [facility] [place]  Office  [phone]  CC: Primary care physician; Patient, No Pcp Per   Note: This dictation was prepared with Dragon dictation along with smaller phrase technology. Any transcriptional errors that result from this process are unintentional.

## 2018-09-20 NOTE — Progress Notes (Signed)
Physical Therapy Evaluation Patient Details Name: Bradley Griffin MRN: [id number] DOB: Apr 23, 1951 Today's Date: 09/20/2018   History of Present Illness  Pt currently admitted under observation status for fall. Pt reports that he was on the ground for 4-5 hours before someone heard him yelling and came to help him up. He compains of progressive weakness over the last 3 weeks. He is also complaining of idiopathic L hip pain x 3 weeks when stating. He did have a fall 4 weeks ago per pt but he states that he did not injure his hip and he does not relate his prior fall to his L hip pain. PMH includes skin CA and pt does have a large area on his face that he reports "is cancer but I haven't had it checked."   Clinical Impression  Pt admitted with above diagnosis. Pt currently with functional limitations due to the deficits listed below (see PT Problem List).  Pt requires assist for bed mobility but he is able to transfer and ambulate with CGA only. He is able to ambulate partially around RN station with bariatric rolling walker. HR increases into 120's bpm and pt appears mildly fatigued. No LE buckling or overt LOB. Chair follow by rehab tech due to history of falls. Gait speed is slow. Pt is somewhat unsafe however he is not a candidate for SNF placement. He would benefit from [facility] PT for deconditioning. He also likely will need assistance with self-care and meal preparation. Pt appears unkept and although he is AOx4 he appears delayed at times when responding to questions. His nails are very long he has an obvious facial discoloration that he has not had assessed but has a history of previous skin cancer. Pt has limited financial resources and reports that he did not apply for Medicare when he turned 68 years old.  Pt will benefit from PT services to address deficits in strength, balance, and mobility in order to return to full function at home.      Follow Up Recommendations Home health PT Intermittent  assistance with ADLs/IADLs such as self care and groceries/meals.    Equipment Recommendations  Rolling walker with 5" wheels;Other (comment)(Likely needs bariatic walker if it will fit in his apartment) Please discuss fall alert system for home.    Recommendations for Other Services       Precautions / Restrictions Precautions Precautions: Fall Restrictions Weight Bearing Restrictions: No      Mobility  Bed Mobility Overal bed mobility: Needs Assistance Bed Mobility: Supine to Sit     Supine to sit: Min assist     General bed mobility comments: Pt moves slowly. He requires bed rails and assist from therapist  Transfers Overall transfer level: Needs assistance Equipment used: Rolling walker (2 wheeled) Transfers: Sit to/from Stand Sit to Stand: Min guard         General transfer comment: No external assist required for transfers. Transfer speed is slow but once standing pt is steady with UE support on rolling walker  Ambulation/Gait Ambulation/Gait assistance: Min guard Gait Distance (Feet): 120 Feet Assistive device: Rolling walker (2 wheeled)       General Gait Details: Pt is able to ambulate partially around RN station with bariatric rolling walker. HR increases into 120's bpm and pt appears mildly fatigued. No LE buckling or overt LOB. Chair follow by rehab tech due to history of falls   Science writer  Modified Rankin (Stroke Patients Only)       Balance Overall balance assessment: Needs assistance Sitting-balance support: No upper extremity supported Sitting balance-Leahy Scale: Fair     Standing balance support: No upper extremity supported Standing balance-Leahy Scale: Fair                               Pertinent Vitals/Pain Pain Assessment: No/denies pain    Home Living Family/patient expects to be discharged to:: Private residence Living Arrangements: Alone Available Help at Discharge: Other  (Comment)(None) Type of Home: Apartment Home Access: Level entry     Home Layout: One level Home Equipment: Cane - single point(No walker)      Prior Function Level of Independence: Independent         Comments: Pt reports that he is independent with ADLs/IADLs. He reports that he was previously driving and able to do all his own grocery shopping/meal preparation     Hand Dominance   Dominant Hand: Left    Extremity/Trunk Assessment   Upper Extremity Assessment Upper Extremity Assessment: Overall WFL for tasks assessed    Lower Extremity Assessment Lower Extremity Assessment: Generalized weakness       Communication   Communication: No difficulties  Cognition Arousal/Alertness: Awake/alert Behavior During Therapy: Flat affect Overall Cognitive Status: Within Functional Limits for tasks assessed                                 General Comments: Pt is slow to respond at times. He seems somewhat cognitively delayed but is AOx4      General Comments      Exercises     Assessment/Plan    PT Assessment Patient needs continued PT services  PT Problem List Decreased strength;Decreased activity tolerance;Decreased balance;Decreased mobility       PT Treatment Interventions DME instruction;Stair training;Gait training;Therapeutic activities;Therapeutic exercise;Balance training;Neuromuscular re-education    PT Goals (Current goals can be found in the Care Plan section)  Acute Rehab PT Goals Patient Stated Goal: Return to his prior level of function PT Goal Formulation: With patient Time For Goal Achievement: 10/04/18 Potential to Achieve Goals: Fair    Frequency Min 2X/week   Barriers to discharge Decreased caregiver support Pt reports no friends/family in the area    Co-evaluation               AM-PAC PT "6 Clicks" Mobility  Outcome Measure Help needed turning from your back to your side while in a flat bed without using bedrails?:  A Little Help needed moving from lying on your back to sitting on the side of a flat bed without using bedrails?: A Little Help needed moving to and from a bed to a chair (including a wheelchair)?: A Little Help needed standing up from a chair using your arms (e.g., wheelchair or bedside chair)?: A Little Help needed to walk in hospital room?: A Little Help needed climbing 3-5 steps with a railing? : A Little 6 Click Score: 18    End of Session Equipment Utilized During Treatment: Gait belt Activity Tolerance: Patient tolerated treatment well Patient left: in chair;with call bell/phone within reach;with chair alarm set Nurse Communication: Mobility status PT Visit Diagnosis: Unsteadiness on feet (R26.81);Muscle weakness (generalized) (M62.81);History of falling (Z91.81);Difficulty in walking, not elsewhere classified (R26.2)    Time: 3220-2542 PT Time Calculation (min) (ACUTE ONLY): 25 min  Charges:   PT Evaluation $PT Eval Low Complexity: 1 Low PT Treatments $Gait Training: 8-22 mins        Lyndel Safe Huprich PT, DPT, GCS   Huprich,Jason 09/20/2018, 11:19 AM

## 2018-09-20 NOTE — TOC Transition Note (Signed)
Transition of Care [facility]) - CM/SW Discharge Note   Patient Details  Name: Bradley Griffin MRN: [id number] Date of Birth: 31-Oct-1950  [facility] CM/SW Contact:  Su Hilt, RN Phone Number: 09/20/2018, 4:15 PM   Clinical Narrative:     Patient to DC home with [facility], Patient set up with Medication thru Med mgt, CM picked up RX for patient as he will DC after closing, The patient was provided a Bariatric walker thru [place] was provided to the patient to ensure he was able to check BS at home as needed [facility] PT and SW was set up with [place]   Final next level of care: [facility]) Barriers to Discharge: Barriers Resolved   Patient Goals and CMS Choice Patient states their goals for this hospitalization and ongoing recovery are:: to go home      Discharge Placement                       Discharge Plan and Services   Discharge Planning Services: CM Consult            DME Arranged: Gilford Rile rolling(bariatric) DME Agency: [employer](thru charity) Date DME Agency Contacted: 09/20/18 Time DME Agency Contacted: 1002 Representative spoke with at DME Agency: Leroy Sea thru Email HH Arranged: PT, Social Work [employer] Agency: [facility] [employer] [facility] [employer] Date [place]: 09/20/18 Time [place]: [place] Representative spoke with at [place] thru email  Social Determinants of Health ([place]) Interventions     Readmission Risk Interventions No flowsheet data found.

## 2018-09-21 LAB — HIV ANTIBODY (ROUTINE TESTING W REFLEX): HIV Screen 4th Generation wRfx: NONREACTIVE

## 2018-11-27 ENCOUNTER — Telehealth: Payer: Self-pay | Admitting: Pharmacy Technician

## 2018-11-27 NOTE — Telephone Encounter (Signed)
Patient failed to provide requested financial documentation.  Financial documentation is required in order to determine patient's eligibility for [facility]'s program.  No additional medication assistance will be provided until patient provides requested financial documentation.  Patient notified by letter.  Harriette Tovey Pharmacy Technician/Eligibility Specialist [facility] 

## 2019-02-07 ENCOUNTER — Other Ambulatory Visit: Payer: Self-pay

## 2019-02-07 ENCOUNTER — Encounter: Payer: Self-pay | Admitting: Emergency Medicine

## 2019-02-07 ENCOUNTER — Emergency Department
Admission: EM | Admit: 2019-02-07 | Discharge: 2019-02-08 | Disposition: A | Discharge status: Other Acute Inpatient Hospital | Payer: Self-pay | Attending: Emergency Medicine | Admitting: Emergency Medicine

## 2019-02-07 DIAGNOSIS — R45851 Suicidal ideations: Secondary | ICD-10-CM | POA: Insufficient documentation

## 2019-02-07 DIAGNOSIS — Z20828 Contact with and (suspected) exposure to other viral communicable diseases: Secondary | ICD-10-CM | POA: Insufficient documentation

## 2019-02-07 DIAGNOSIS — F332 Major depressive disorder, recurrent severe without psychotic features: Secondary | ICD-10-CM | POA: Diagnosis present

## 2019-02-07 DIAGNOSIS — Z85820 Personal history of malignant melanoma of skin: Secondary | ICD-10-CM | POA: Insufficient documentation

## 2019-02-07 HISTORY — DX: Blindness, one eye, unspecified eye: H54.40

## 2019-02-07 LAB — BASIC METABOLIC PANEL
Anion gap: 13 (ref 5–15)
BUN: 14 mg/dL (ref 8–23)
CO2: 21 mmol/L — ABNORMAL LOW (ref 22–32)
Calcium: 8.9 mg/dL (ref 8.9–10.3)
Chloride: 101 mmol/L (ref 98–111)
Creatinine, Ser: 0.83 mg/dL (ref 0.61–1.24)
GFR calc Af Amer: >60 mL/min (ref >60)
GFR calc non Af Amer: >60 mL/min (ref >60)
Glucose, Bld: 244 mg/dL — ABNORMAL HIGH (ref 70–99)
Potassium: 3.7 mmol/L (ref 3.5–5.1)
Sodium: 135 mmol/L (ref 135–145)

## 2019-02-07 LAB — URINALYSIS, COMPLETE (UACMP) WITH MICROSCOPIC
Bacteria, UA: NONE SEEN
Bilirubin Urine: NEGATIVE
Glucose, UA: 50 mg/dL — AB
Hgb urine dipstick: NEGATIVE
Ketones, ur: NEGATIVE mg/dL
Leukocytes,Ua: NEGATIVE
Nitrite: NEGATIVE
Protein, ur: 30 mg/dL — AB
Specific Gravity, Urine: 1.025 (ref 1.005–1.030)
Squamous Epithelial / HPF: NONE SEEN (ref 0–5)
pH: 6 (ref 5.0–8.0)

## 2019-02-07 LAB — CBC
HCT: 45.4 % (ref 39.0–52.0)
Hemoglobin: 15.4 g/dL (ref 13.0–17.0)
MCH: 31.7 pg (ref 26.0–34.0)
MCHC: 33.9 g/dL (ref 30.0–36.0)
MCV: 93.4 fL (ref 80.0–100.0)
Platelets: 267 10*3/uL (ref 150–400)
RBC: 4.86 MIL/uL (ref 4.22–5.81)
RDW: 13.8 % (ref 11.5–15.5)
WBC: 9.8 10*3/uL (ref 4.0–10.5)
nRBC: 0 % (ref 0.0–0.2)

## 2019-02-07 LAB — TSH: TSH: 2.422 u[IU]/mL (ref 0.350–4.500)

## 2019-02-07 LAB — URINE DRUG SCREEN, QUALITATIVE (ARMC ONLY)
Amphetamines, Ur Screen: NOT DETECTED
Barbiturates, Ur Screen: NOT DETECTED
Benzodiazepine, Ur Scrn: NOT DETECTED
Cannabinoid 50 Ng, Ur ~~LOC~~: NOT DETECTED
Cocaine Metabolite,Ur ~~LOC~~: NOT DETECTED
MDMA (Ecstasy)Ur Screen: NOT DETECTED
Methadone Scn, Ur: NOT DETECTED
Opiate, Ur Screen: NOT DETECTED
Phencyclidine (PCP) Ur S: NOT DETECTED
Tricyclic, Ur Screen: NOT DETECTED

## 2019-02-07 LAB — SARS CORONAVIRUS 2 BY RT PCR (HOSPITAL ORDER, PERFORMED IN ~~LOC~~ HOSPITAL LAB): SARS Coronavirus 2: NEGATIVE

## 2019-02-07 LAB — SALICYLATE LEVEL: Salicylate Lvl: <7 mg/dL (ref 2.8–30.0)

## 2019-02-07 LAB — ACETAMINOPHEN LEVEL: Acetaminophen (Tylenol), Serum: <10 ug/mL — ABNORMAL LOW (ref 10–30)

## 2019-02-07 MED ORDER — CITALOPRAM HYDROBROMIDE 20 MG PO TABS: 10.0000 mg | ORAL_TABLET | Freq: Every day | ORAL | Status: DC

## 2019-02-07 NOTE — ED Notes (Signed)
Pt ambulatory with steady gait 

## 2019-02-07 NOTE — ED Provider Notes (Signed)
[name] Emergency Department Provider Note   ____________________________________________   First MD Initiated Contact with Patient 02/07/19 1559     (approximate)  I have reviewed the triage vital signs and the nursing notes.   HISTORY  Chief Complaint Suicidal    HPI Bradley Griffin is a 68 y.o. male here for evaluation for suicidal ideation  Patient reports his life has become hopeless.  He is almost out of money, he thinks he might lose his home, can barely afford any food.  [place] in his hand, and he was considering jumping in and drowning himself  He denies any acute medical conditions.  No fevers chills no exposure to COVID.  He does report that he has skin cancer that is known   Past Medical History:  Diagnosis Date  . Blindness of left eye   . Cancer [facility])    Skin cancer     Patient Active Problem List   Diagnosis Date Noted  . Major depressive disorder, recurrent severe without psychotic features ([place]) 02/07/2019  . Generalized weakness 09/19/2018    Past Surgical History:  Procedure Laterality Date  . CHOLECYSTECTOMY      Prior to Admission medications   Not on File    Allergies Patient has no known allergies.  History reviewed. No pertinent family history.  Social History Social History   Tobacco Use  . Smoking status: Never Smoker  . Smokeless tobacco: Never Used  Substance Use Topics  . Alcohol use: Never    Frequency: Never  . Drug use: Never    Review of Systems Constitutional: No fever/chills or exposure to COVID Eyes: No visual changes. ENT: No sore throat.  No loss of taste Cardiovascular: Denies chest pain. Respiratory: Denies shortness of breath. Gastrointestinal: No abdominal pain.   Genitourinary: Negative for dysuria. Musculoskeletal: Negative for back pain. Skin: Negative for rash. Neurological: Negative for headaches, areas of focal  weakness or numbness.    ____________________________________________   PHYSICAL EXAM:  VITAL SIGNS: ED Triage Vitals  Enc Vitals Group     BP 02/07/19 1417 125/60     Pulse Rate 02/07/19 1417 74     Resp 02/07/19 1417 18     Temp 02/07/19 1417 98.3 F (36.8 C)     Temp Source 02/07/19 1417 Oral     SpO2 02/07/19 1417 97 %     Weight 02/07/19 1416 (!) 340 lb (154.2 kg)     Height 02/07/19 1416 5\' 9"  (1.753 m)     Head Circumference --      Peak Flow --      Pain Score 02/07/19 1428 0     Pain Loc --      Pain Edu? --      Excl. in [place]? --     Constitutional: Alert and oriented. Well appearing and in no acute distress. Eyes: Conjunctivae are normal. Head: Atraumatic.  Right face lateral to his right eye has a masslike appearance which she reports is known skin cancer Nose: No congestion/rhinnorhea. Mouth/Throat: Mucous membranes are moist. Neck: No stridor.  Cardiovascular: Normal rate, regular rhythm. Grossly normal heart sounds.  Good peripheral circulation. Respiratory: Normal respiratory effort.  No retractions. Lungs CTAB. Gastrointestinal: Soft and nontender. No distention. Musculoskeletal: No lower extremity tenderness nor edema. Neurologic:  Normal speech and language. No gross focal neurologic deficits are appreciated.  Skin:  Skin is warm, dry and intact. No rash noted.  Psychiatric: Mood and affect are flat and reports suicidal ideation  ____________________________________________   LABS (all labs ordered are listed, but only abnormal results are displayed)  Labs Reviewed  BASIC METABOLIC PANEL - Abnormal; Notable for the following components:      Result Value   CO2 21 (*)    Glucose, Bld 244 (*)    All other components within normal limits  ACETAMINOPHEN LEVEL - Abnormal; Notable for the following components:   Acetaminophen (Tylenol), Serum <10 (*)    All other components within normal limits  URINALYSIS, COMPLETE (UACMP) WITH MICROSCOPIC -  Abnormal; Notable for the following components:   Color, Urine AMBER (*)    APPearance TURBID (*)    Glucose, UA 50 (*)    Protein, ur 30 (*)    All other components within normal limits  SARS CORONAVIRUS 2 (HOSPITAL ORDER, [facility])  CBC  SALICYLATE LEVEL  URINE DRUG SCREEN, QUALITATIVE ([facility] ONLY)  TSH   ____________________________________________  EKG   ____________________________________________  RADIOLOGY   ____________________________________________   PROCEDURES  Procedure(s) performed: None  Procedures  Critical Care performed: No  ____________________________________________   INITIAL IMPRESSION / ASSESSMENT AND PLAN / ED COURSE  Pertinent labs & imaging results that were available during my care of the patient were reviewed by me and considered in my medical decision making (see chart for details).   Patient denies acute medical illness or overdose or ingestion.  He does however report active suicidal ideation is under involuntary commitment   Medical screening labs reassuring.  He does report a previous suicide attempt as well in his 73s.  Will maintain under IVC, consultation with psychiatry  ----------------------------------------- 10:56 PM on 02/07/2019 -----------------------------------------  Patient resting comfortably.  Pending plan for psychiatry admission, seen by nurse practitioner Reita Cliche.  Ongoing care and disposition the ER signed to my partner Dr. Beather Arbour at 1130       ____________________________________________   FINAL CLINICAL IMPRESSION(S) / ED DIAGNOSES  Final diagnoses:  Major depressive disorder, recurrent severe without psychotic features [facility])        Note:  This document was prepared using Dragon voice recognition software and may include unintentional dictation errors       Delman Kitten, MD 02/07/19 2351

## 2019-02-07 NOTE — ED Notes (Signed)
Pharmacy called to send medications down for patient

## 2019-02-07 NOTE — ED Notes (Signed)
Pharmacy called and requested pt's medications

## 2019-02-07 NOTE — ED Triage Notes (Addendum)
Pt brought in via PD from [facility]. Police were called out by bystander who reported pt was near the [place] with a 10-15 lb dumbbell asking them to push him in. Police reports he also had a suicide note written out in his car. Pt states "[place] Pt denies currently thoughts of self harm in triage. Police reports they are taking out IVC papers. Pt reports being told around a year ago having skin cancer on the right side of his face. Area is scabbed and dried blood noted from where pt picks at area. Pt is disheveled and has body odor. Pt has itchy rash all over body. Pt's feet are very scaley and toenails have discoloration and are long/pointy.

## 2019-02-07 NOTE — ED Notes (Signed)
Pt given meal tray and drink by [employer]

## 2019-02-07 NOTE — BH Assessment (Signed)
Assessment Note  Bradley Griffin is an 68 y.o. male who presents to the ER via law enforcement, after [place] a [place] to his body and the plan to drown in a lake. Patient states, he wants to die because he "ran out of money" and he's unable to work. He was living off money his mother left him. Patient further reports, of having no support or any help. He has no family, children and no friends.  Patient currently lives independent, in his own apartment.  In the past, he was receiving outpatient treatment from [facility]. He currently has no outpatient provider and doesn't receive any form of treatment. Approximately a year ago, he was diagnosed with skin cancer.  During the interview, the patient was calm, cooperative and pleasant. He was able to provide appropriate answers to the questions. He denies the use of any mind-altering substances. He also denies history of aggression and violence. He has no current or past with the legal system. Patient denies HI and AV/H.   Diagnosis: Major Depression  Past Medical History:  Past Medical History:  Diagnosis Date  . Blindness of left eye   . Cancer [facility])    Skin cancer     Past Surgical History:  Procedure Laterality Date  . CHOLECYSTECTOMY      Family History: History reviewed. No pertinent family history.  Social History:  reports that he has never smoked. He has never used smokeless tobacco. He reports that he does not drink alcohol or use drugs.  Additional Social History:  Alcohol / Drug Use Pain Medications: See PTA Prescriptions: See PTA Over the Counter: See PTA History of alcohol / drug use?: No history of alcohol / drug abuse Longest period of sobriety (when/how long): Reports of no past or current use  CIWA: CIWA-Ar BP: (!) 114/56 Pulse Rate: (!) 57 COWS:    Allergies: No Known Allergies  Home Medications: (Not in a hospital admission)   OB/GYN Status:  No LMP for male  patient.  General Assessment Data Location of Assessment: [facility] ED TTS Assessment: In system Is this a Tele or Face-to-Face Assessment?: Face-to-Face Is this an Initial Assessment or a Re-assessment for this encounter?: Initial Assessment Patient Accompanied by:: N/A Language Other than English: No Living Arrangements: Other (Comment)(Private Home) What gender do you identify as?: Male Marital status: Single Pregnancy Status: No Living Arrangements: Alone Can pt return to current living arrangement?: Yes Admission Status: Involuntary Petitioner: Police Is patient capable of signing voluntary admission?: (Under IVC) Referral Source: Self/Family/Friend Insurance type: None  Medical Screening Exam ([place]) Medical Exam completed: Yes  Crisis Care Plan Living Arrangements: Alone Legal Guardian: Other:(Self) Name of Psychiatrist: Reports of none Name of Therapist: Reports of none  Education Status Is patient currently in school?: No Is the patient employed, unemployed or receiving disability?: Unemployed  Risk to self with the past 6 months Suicidal Ideation: Yes-Currently Present Has patient been a risk to self within the past 6 months prior to admission? : Yes Suicidal Intent: Yes-Currently Present Has patient had any suicidal intent within the past 6 months prior to admission? : Yes Is patient at risk for suicide?: Yes Suicidal Plan?: Yes-Currently Present Has patient had any suicidal plan within the past 6 months prior to admission? : Yes Specify Current Suicidal Plan: [place] Access to Means: Yes Specify Access to Suicidal Means: [place] the [place] today with weght tied to him  What has been your use of drugs/alcohol within the last 12 months?: Reports of none Previous Attempts/Gestures: No How many times?: 0 Other Self Harm Risks: Reports of none Triggers for Past Attempts: None known Intentional Self Injurious Behavior: None Family Suicide  History: Unknown Recent stressful life event(s): Recent negative physical changes, Financial Problems Persecutory voices/beliefs?: No Depression: Yes Depression Symptoms: Tearfulness, Isolating, Fatigue, Guilt, Loss of interest in usual pleasures, Feeling worthless/self pity Substance abuse history and/or treatment for substance abuse?: No Suicide prevention information given to non-admitted patients: Not applicable  Risk to Others within the past 6 months Homicidal Ideation: No Does patient have any lifetime risk of violence toward others beyond the six months prior to admission? : No Thoughts of Harm to Others: No Current Homicidal Intent: No Current Homicidal Plan: No Access to Homicidal Means: No Identified Victim: Reports of none History of harm to others?: No Assessment of Violence: None Noted Violent Behavior Description: Reports of none Does patient have access to weapons?: No Criminal Charges Pending?: No Does patient have a court date: No Is patient on probation?: No  Psychosis Hallucinations: None noted Delusions: None noted  Mental Status Report Appearance/Hygiene: Unremarkable, In scrubs Eye Contact: Good Motor Activity: Unable to assess(Laying in the bed) Speech: Logical/coherent, Soft, Unremarkable Level of Consciousness: Alert Mood: Depressed, Sad, Pleasant Affect: Appropriate to circumstance, Depressed, Sad Anxiety Level: Minimal Thought Processes: Coherent, Relevant Judgement: Unimpaired Orientation: Person, Place, Time, Situation, Appropriate for developmental age Obsessive Compulsive Thoughts/Behaviors: Moderate  Cognitive Functioning Concentration: Normal Memory: Recent Intact, Remote Intact Is patient IDD: No Insight: Fair Impulse Control: Fair Appetite: Good Have you had any weight changes? : No Change Sleep: Decreased Total Hours of Sleep: 5 Vegetative Symptoms: None  ADLScreening [facility] Assessment Services) Patient's cognitive ability  adequate to safely complete daily activities?: Yes Patient able to express need for assistance with ADLs?: Yes Independently performs ADLs?: Yes (appropriate for developmental age)  Prior Inpatient Therapy Prior Inpatient Therapy: No  Prior Outpatient Therapy Prior Outpatient Therapy: Yes Prior Therapy Dates: "Some years ago" Prior Therapy Facilty/Provider(s): [facility] Reason for Treatment: Depression Does patient have an ACCT team?: No Does patient have Intensive In-House Services?  : No Does patient have [facility] services? : No Does patient have P4CC services?: No  ADL Screening (condition at time of admission) Patient's cognitive ability adequate to safely complete daily activities?: Yes Is the patient deaf or have difficulty hearing?: No Does the patient have difficulty seeing, even when wearing glasses/contacts?: No Does the patient have difficulty concentrating, remembering, or making decisions?: No Patient able to express need for assistance with ADLs?: Yes Does the patient have difficulty dressing or bathing?: No Independently performs ADLs?: Yes (appropriate for developmental age) Does the patient have difficulty walking or climbing stairs?: No Weakness of Legs: None Weakness of Arms/Hands: None  Home Assistive Devices/Equipment Home Assistive Devices/Equipment: None  Therapy Consults (therapy consults require a physician order) PT Evaluation Needed: No OT Evalulation Needed: No SLP Evaluation Needed: No Abuse/Neglect Assessment (Assessment to be complete while patient is alone) Abuse/Neglect Assessment Can Be Completed: Yes Physical Abuse: Denies Verbal Abuse: Denies Sexual Abuse: Denies Exploitation of patient/patient's resources: Denies Self-Neglect: Denies Values / Beliefs Cultural Requests During Hospitalization: None Spiritual Requests During Hospitalization: None Consults Spiritual Care Consult Needed: No Social Work Consult Needed:  No Regulatory affairs officer (For Healthcare) Does Patient Have a Medical Advance Directive?: No       Child/Adolescent Assessment Running Away Risk: Denies(Patient is an adult)  Disposition:  On Site Evaluation by:   Reviewed with Physician:    Gunnar Fusi MS, LCAS, [facility], [place] Therapeutic Triage Specialist 02/07/2019 10:31 PM

## 2019-02-07 NOTE — ED Notes (Signed)
Pt's belongings in bag: two nike shoes, grey sweat pants, black shirt, wallet with no cash, and keys. Pt currently wearing glasses.

## 2019-02-07 NOTE — ED Notes (Signed)
IVC/Consult ordered ?

## 2019-02-07 NOTE — Consult Note (Signed)
[facility] Face-to-Face Psychiatry Consult   Reason for Consult:  Suicide attempt Referring Physician:  EDP Patient Identification: Bradley Griffin MRN:  [id number] Principal Diagnosis: Major depressive disorder, recurrent severe without psychotic features ([place]) Diagnosis:  Principal Problem:   Major depressive disorder, recurrent severe without psychotic features ([place])  Total Time spent with patient: 1 hour  Subjective:   Bradley Griffin is a 68 y.o. male patient admitted with suicide attempt.  "I didn't see any other alternative."  Patient seen and evaluated in person by this provider.  He was found at [facility] trying to drown himself.  Continues to endorse feelings of hopelessness, helplessness, and worthlessness.  He has no support system and lives alone.  Currently, his rent is 2-3 months behind and he is close to being evicted.  Feels he has nothing to live for and does not want to continue in this life.  No hallucinations or substance abuse.  Admission needed.  HPI on admission: Pt brought in via PD from [facility]. Police were called out by bystander who reported pt was near the [place] with a 10-15 lb dumbbell asking them to push him in. Police reports he also had a suicide note written out in his car. Pt [place] Pt denies currently thoughts of self harm in triage. Police reports they are taking out IVC papers. Pt reports being told around a year ago having skin cancer on the right side of his face. Area is scabbed and dried blood noted from where pt picks at area. Pt is disheveled and has body odor. Pt has itchy rash all over body. Pt's feet are very scaley and toenails have discoloration and are long/pointy.   Past Psychiatric History: depression  Risk to Self:  yes Risk to Others:  none Prior Inpatient Therapy:  none Prior Outpatient Therapy:  none  Past Medical History:  Past Medical History:  Diagnosis Date  . Blindness  of left eye   . Cancer [facility])    Skin cancer     Past Surgical History:  Procedure Laterality Date  . CHOLECYSTECTOMY     Family History: History reviewed. No pertinent family history. Family Psychiatric  History: none Social History:  Social History   Substance and Sexual Activity  Alcohol Use Never  . Frequency: Never     Social History   Substance and Sexual Activity  Drug Use Never    Social History   Socioeconomic History  . Marital status: Single    Spouse name: Not on file  . Number of children: Not on file  . Years of education: Not on file  . Highest education level: Not on file  Occupational History  . Not on file  Social Needs  . Financial resource strain: Not on file  . Food insecurity    Worry: Not on file    Inability: Not on file  . Transportation needs    Medical: Not on file    Non-medical: Not on file  Tobacco Use  . Smoking status: Never Smoker  . Smokeless tobacco: Never Used  Substance and Sexual Activity  . Alcohol use: Never    Frequency: Never  . Drug use: Never  . Sexual activity: Not Currently  Lifestyle  . Physical activity    Days per week: Not on file    Minutes per session: Not on file  . Stress: Not on file  Relationships  . Social connections  Talks on phone: Not on file    Gets together: Not on file    Attends religious service: Not on file    Active member of club or organization: Not on file    Attends meetings of clubs or organizations: Not on file    Relationship status: Not on file  Other Topics Concern  . Not on file  Social History Narrative  . Not on file   Additional Social History:    Allergies:  No Known Allergies  Labs:  Results for orders placed or performed during the hospital encounter of 02/07/19 (from the past 48 hour(s))  CBC     Status: None   Collection Time: 02/07/19  2:32 PM  Result Value Ref Range   WBC 9.8 4.0 - 10.5 K/uL   RBC 4.86 4.22 - 5.81 MIL/uL   Hemoglobin 15.4 13.0 - 17.0  g/dL   HCT 45.4 39.0 - 52.0 %   MCV 93.4 80.0 - 100.0 fL   MCH 31.7 26.0 - 34.0 pg   MCHC 33.9 30.0 - 36.0 g/dL   RDW 13.8 11.5 - 15.5 %   Platelets 267 150 - 400 K/uL   nRBC 0.0 0.0 - 0.2 %    Comment: Performed at [facility], [address] [place], [place] [id number]  Basic metabolic panel     Status: Abnormal   Collection Time: 02/07/19  2:32 PM  Result Value Ref Range   Sodium 135 135 - 145 mmol/L   Potassium 3.7 3.5 - 5.1 mmol/L   Chloride 101 98 - 111 mmol/L   CO2 21 (L) 22 - 32 mmol/L   Glucose, Bld 244 (H) 70 - 99 mg/dL   BUN 14 8 - 23 mg/dL   Creatinine, Ser 0.83 0.61 - 1.24 mg/dL   Calcium 8.9 8.9 - 10.3 mg/dL   GFR calc non Af Amer >60 >60 mL/min   GFR calc Af Amer >60 >60 mL/min   Anion gap 13 5 - 15    Comment: Performed at [facility], [address], [place], [place] [address]  Acetaminophen level     Status: Abnormal   Collection Time: 02/07/19  2:32 PM  Result Value Ref Range   Acetaminophen (Tylenol), Serum <10 (L) 10 - 30 ug/mL    Comment: (NOTE) Therapeutic concentrations vary significantly. A range of 10-30 ug/mL  may be an effective concentration for many patients. However, some  are best treated at concentrations outside of this range. Acetaminophen concentrations >150 ug/mL at 4 hours after ingestion  and >50 ug/mL at 12 hours after ingestion are often associated with  toxic reactions. Performed at [facility], [address] [place], [place] [id number]   Salicylate level     Status: None   Collection Time: 02/07/19  2:32 PM  Result Value Ref Range   Salicylate Lvl [id number] 2.8 - 30.0 mg/dL    Comment: Performed at [facility], [address] [place], [place] [address]  TSH     Status: None   Collection Time: 02/07/19  2:32 PM  Result Value Ref Range   TSH 2.422 0.350 - 4.500 uIU/mL    Comment: Performed by a 3rd Generation assay with a functional sensitivity of <=0.01 uIU/mL. Performed at [facility], [address] [place], [place] [address]   Urine Drug Screen, Qualitative [facility] only)     Status: None   Collection Time: 02/07/19  5:00 PM  Result Value Ref Range   Tricyclic,  Ur Screen NONE DETECTED NONE DETECTED   Amphetamines, Ur Screen NONE DETECTED NONE DETECTED   MDMA (Ecstasy)Ur Screen NONE DETECTED NONE DETECTED   Cocaine Metabolite,Ur [place] NONE DETECTED NONE DETECTED   Opiate, Ur Screen NONE DETECTED NONE DETECTED   Phencyclidine (PCP) Ur S NONE DETECTED NONE DETECTED   Cannabinoid 50 Ng, Ur [place] NONE DETECTED NONE DETECTED   Barbiturates, Ur Screen NONE DETECTED NONE DETECTED   Benzodiazepine, Ur Scrn NONE DETECTED NONE DETECTED   Methadone Scn, Ur NONE DETECTED NONE DETECTED    Comment: (NOTE) Tricyclics + metabolites, urine    Cutoff 1000 ng/mL Amphetamines + metabolites, urine  Cutoff 1000 ng/mL MDMA (Ecstasy), urine              Cutoff 500 ng/mL Cocaine Metabolite, urine          Cutoff 300 ng/mL Opiate + metabolites, urine        Cutoff 300 ng/mL Phencyclidine (PCP), urine         Cutoff 25 ng/mL Cannabinoid, urine                 Cutoff 50 ng/mL Barbiturates + metabolites, urine  Cutoff 200 ng/mL Benzodiazepine, urine              Cutoff 200 ng/mL Methadone, urine                   Cutoff 300 ng/mL The urine drug screen provides only a preliminary, unconfirmed analytical test result and should not be used for non-medical purposes. Clinical consideration and professional judgment should be applied to any positive drug screen result due to possible interfering substances. A more specific alternate chemical method must be used in order to obtain a confirmed analytical result. Gas chromatography / mass spectrometry (GC/MS) is the preferred confirmat ory method. Performed at [facility], [address] [place], [place] [address]     Current Facility-Administered Medications  Medication Dose Route Frequency Provider Last Rate Last Dose  .  citalopram (CELEXA) tablet 10 mg  10 mg Oral Daily Patrecia Pour, NP       Current Outpatient Medications  Medication Sig Dispense Refill  . metFORMIN (GLUCOPHAGE) 1000 MG tablet Take 1 tablet (1,000 mg total) by mouth 2 (two) times daily with a meal. 60 tablet 2    Musculoskeletal: Strength & Muscle Tone: within normal limits Gait & Station: normal Patient leans: N/A  Psychiatric Specialty Exam: Physical Exam  Nursing note and vitals reviewed. Constitutional: He is oriented to person, place, and time. He appears well-developed and well-nourished.  HENT:  Head: Normocephalic.  Neck: Normal range of motion.  Respiratory: Effort normal.  Musculoskeletal: Normal range of motion.  Neurological: He is alert and oriented to person, place, and time.  Psychiatric: His speech is normal and behavior is normal. His mood appears anxious. Cognition and memory are normal. He expresses impulsivity. He exhibits a depressed mood. He expresses suicidal ideation. He expresses suicidal plans.    Review of Systems  Psychiatric/Behavioral: Positive for depression. The patient is nervous/anxious.   All other systems reviewed and are negative.   Blood pressure (!) 125/59, pulse 63, temperature 98.3 F (36.8 C), temperature source Oral, resp. rate (!) 22, height 5\' 9"  (1.753 m), weight (!) 154.2 kg, SpO2 97 %.Body mass index is 50.21 kg/m.  General Appearance: Casual  Eye Contact:  Fair  Speech:  Normal Rate  Volume:  Decreased  Mood:  Anxious and Depressed  Affect:  Congruent  Thought Process:  Coherent and Descriptions of Associations: Intact  Orientation:  Full (Time, Place, and Person)  Thought Content:  Rumination  Suicidal Thoughts:  Yes.  with intent/plan  Homicidal Thoughts:  No  Memory:  Immediate;   Fair Recent;   Fair Remote;   Fair  Judgement:  Poor  Insight:  Fair  Psychomotor Activity:  Decreased  Concentration:  Concentration: Fair and Attention Span: Fair  Recall:  [employer] of Knowledge:  Fair  Language:  Good  Akathisia:  No  Handed:  Right  AIMS (if indicated):     Assets:  Leisure Time Physical Health Resilience  ADL's:  Intact  Cognition:  WNL  Sleep:        Treatment Plan Summary: Daily contact with patient to assess and evaluate symptoms and progress in treatment, Medication management and Plan major depressive disorder, recurrent, severe without psychosis:  -Started Celexa 10 mg daily -Admit to inpatient  Disposition: Recommend psychiatric Inpatient admission when medically cleared.  Waylan Boga, NP 02/07/2019 6:36 PM

## 2019-02-07 NOTE — ED Provider Notes (Signed)
-----------------------------------------   11:41 PM on 02/07/2019 -----------------------------------------  Patient admitted to the [facility]   Paulette Blanch, MD 02/07/19 [phone]

## 2019-02-08 ENCOUNTER — Inpatient Hospital Stay
Admission: RE | Admit: 2019-02-08 | Discharge: 2019-02-20 | DRG: 885 | Disposition: A | Discharge status: Home / Self Care | Payer: Medicare Other | Source: Intra-hospital | Attending: Psychiatry | Admitting: Psychiatry

## 2019-02-08 ENCOUNTER — Inpatient Hospital Stay: Payer: Medicare Other

## 2019-02-08 DIAGNOSIS — C4491 Basal cell carcinoma of skin, unspecified: Secondary | ICD-10-CM | POA: Diagnosis present

## 2019-02-08 DIAGNOSIS — Z20828 Contact with and (suspected) exposure to other viral communicable diseases: Secondary | ICD-10-CM | POA: Diagnosis present

## 2019-02-08 DIAGNOSIS — R41843 Psychomotor deficit: Secondary | ICD-10-CM | POA: Diagnosis present

## 2019-02-08 DIAGNOSIS — Z9119 Patient's noncompliance with other medical treatment and regimen: Secondary | ICD-10-CM | POA: Diagnosis not present

## 2019-02-08 DIAGNOSIS — F3181 Bipolar II disorder: Secondary | ICD-10-CM | POA: Diagnosis present

## 2019-02-08 DIAGNOSIS — R45851 Suicidal ideations: Secondary | ICD-10-CM | POA: Diagnosis present

## 2019-02-08 DIAGNOSIS — E119 Type 2 diabetes mellitus without complications: Secondary | ICD-10-CM

## 2019-02-08 DIAGNOSIS — R0600 Dyspnea, unspecified: Secondary | ICD-10-CM

## 2019-02-08 DIAGNOSIS — F332 Major depressive disorder, recurrent severe without psychotic features: Secondary | ICD-10-CM | POA: Diagnosis present

## 2019-02-08 DIAGNOSIS — F411 Generalized anxiety disorder: Secondary | ICD-10-CM | POA: Diagnosis present

## 2019-02-08 DIAGNOSIS — Z7984 Long term (current) use of oral hypoglycemic drugs: Secondary | ICD-10-CM | POA: Diagnosis not present

## 2019-02-08 DIAGNOSIS — Z590 Homelessness: Secondary | ICD-10-CM | POA: Diagnosis not present

## 2019-02-08 DIAGNOSIS — G4700 Insomnia, unspecified: Secondary | ICD-10-CM | POA: Diagnosis present

## 2019-02-08 DIAGNOSIS — F603 Borderline personality disorder: Secondary | ICD-10-CM

## 2019-02-08 MED ORDER — ALUM & MAG HYDROXIDE-SIMETH 200-200-20 MG/5ML PO SUSP: 30.0000 mL | ORAL | Status: DC | PRN

## 2019-02-08 MED ORDER — INSULIN ASPART 100 UNIT/ML ~~LOC~~ SOLN: 0.0000 [IU] | Freq: Three times a day (TID) | SUBCUTANEOUS | Status: DC

## 2019-02-08 MED ORDER — ACETAMINOPHEN 325 MG PO TABS: 650.0000 mg | ORAL_TABLET | Freq: Four times a day (QID) | ORAL | Status: DC | PRN

## 2019-02-08 MED ORDER — METFORMIN HCL 500 MG PO TABS
1000.0000 mg | ORAL_TABLET | Freq: Two times a day (BID) | ORAL | Status: DC
  Administered 2019-02-08 – 2019-02-20 (×25): 1000 mg via ORAL
  Filled 2019-02-08 (×25): qty 2

## 2019-02-08 MED ORDER — MAGNESIUM HYDROXIDE 400 MG/5ML PO SUSP: 30.0000 mL | Freq: Every day | ORAL | Status: DC | PRN

## 2019-02-08 MED ORDER — CITALOPRAM HYDROBROMIDE 20 MG PO TABS
10.0000 mg | ORAL_TABLET | Freq: Every day | ORAL | Status: DC
  Administered 2019-02-08 – 2019-02-09 (×2): 10 mg via ORAL
  Filled 2019-02-08 (×2): qty 1

## 2019-02-08 MED ORDER — INSULIN ASPART 100 UNIT/ML ~~LOC~~ SOLN: 0.0000 [IU] | Freq: Every day | SUBCUTANEOUS | Status: DC

## 2019-02-08 NOTE — Plan of Care (Signed)
  Problem: Medication: Goal: Compliance with prescribed medication regimen will improve Outcome: Progressing   Problem: Education: Goal: Ability to make informed decisions regarding treatment will improve Outcome: Not Progressing   Problem: Coping: Goal: Coping ability will improve Outcome: Not Progressing

## 2019-02-08 NOTE — BH Assessment (Signed)
Patient is to be admitted to [facility] by Psychiatric Nurse Practitioner Waylan Boga.  Attending Physician will be Dr. Weber Cooks.   Patient has been assigned to room 320, by [place].   ER staff is aware of the admission:  Dr. Beather Arbour, ER MD   Maudie Mercury, Patient's Nurse   Sharyn Lull, Patient Access.

## 2019-02-08 NOTE — Tx Team (Signed)
Initial Treatment Plan 02/08/2019 1:47 AM [name] [id number]    PATIENT STRESSORS: Financial difficulties Health problems Medication change or noncompliance Occupational concerns   PATIENT STRENGTHS: Average or above average intelligence Capable of independent living Communication skills Motivation for treatment/growth   PATIENT IDENTIFIED PROBLEMS:   Depression/Anxiety    Suicidal ideations     Body Image disturbance    worthlessness       DISCHARGE CRITERIA:  Adequate post-discharge living arrangements Motivation to continue treatment in a less acute level of care Need for constant or close observation no longer present Reduction of life-threatening or endangering symptoms to within safe limits  PRELIMINARY DISCHARGE PLAN: Outpatient therapy Participate in family therapy Return to previous living arrangement Return to previous work or school arrangements  PATIENT/FAMILY INVOLVEMENT: This treatment plan has been presented to and reviewed with the patient, Bradley Griffin, .  The patient  given the opportunity to ask questions and make suggestions.  Clemens Catholic, RN 02/08/2019, 1:47 AM

## 2019-02-08 NOTE — BHH Group Notes (Signed)
LCSW Group Therapy Note   02/08/2019 1:15pm   Type of Therapy and Topic:  Group Therapy:  Positive Affirmations   Participation Level:  Did Not Attend  Description of Group: This group addressed positive affirmation toward self and others. Patients went around the room and identified two positive things about themselves and two positive things about a peer in the room. Patients reflected on how it felt to share something positive with others, to identify positive things about themselves, and to hear positive things from others. Patients were encouraged to have a daily reflection of positive characteristics or circumstances.  Therapeutic Goals 1. Patient will verbalize two of their positive qualities 2. Patient will demonstrate empathy for others by stating two positive qualities about a peer in the group 3. Patient will verbalize their feelings when voicing positive self affirmations and when voicing positive affirmations of others 4. Patients will discuss the potential positive impact on their wellness/recovery of focusing on positive traits of self and others. Summary of Patient Progress:    Therapeutic Modalities Cognitive Behavioral Therapy Motivational Interviewing  Trish Mage, [facility] 02/08/2019 2:13 PM

## 2019-02-08 NOTE — Progress Notes (Signed)
Patient presents with sad flat affect. Minimal interaction with staff and peers. Patient med compliant although states he does not need any meds for depression. Patient tearful while denying depression. Up in day room for meals. Did not attend group. Xrays taken. Patient refused CBG reports he is not a diabetic and does not need any insulin or finger checks. Encouraged patient to attend group. Patient refuses and remains isolative to room. Patient remains safe on unit with q 15 min checks.

## 2019-02-08 NOTE — Tx Team (Signed)
Interdisciplinary Treatment and Diagnostic Plan Update  02/08/2019 Time of Session: 9:18 AM  YENGKONG BIBB MRN: [id number]  Principal Diagnosis: <principal problem not specified>  Secondary Diagnoses: Active Problems:   Major depressive disorder, recurrent severe without psychotic features ([place])   Current Medications:  Current Facility-Administered Medications  Medication Dose Route Frequency Provider Last Rate Last Dose  . acetaminophen (TYLENOL) tablet 650 mg  650 mg Oral Q6H PRN Patrecia Pour, NP      . alum & mag hydroxide-simeth (MAALOX/MYLANTA) 200-200-20 MG/5ML suspension 30 mL  30 mL Oral Q4H PRN Patrecia Pour, NP      . citalopram (CELEXA) tablet 10 mg  10 mg Oral Daily Patrecia Pour, NP   10 mg at 02/08/19 [id number]  . magnesium hydroxide (MILK OF MAGNESIA) suspension 30 mL  30 mL Oral Daily PRN Patrecia Pour, NP      . metFORMIN (GLUCOPHAGE) tablet 1,000 mg  1,000 mg Oral BID WC Patrecia Pour, NP   1,000 mg at 02/08/19 [id number]    PTA Medications: No medications prior to admission.    Patient Stressors: Financial difficulties Health problems Medication change or noncompliance Occupational concerns  Patient Strengths: Average or above average intelligence Capable of independent living Communication skills Motivation for treatment/growth  Treatment Modalities: Medication Management, Group therapy, Case management,  1 to 1 session with clinician, Psychoeducation, Recreational therapy.   Physician Treatment Plan for Primary Diagnosis: <principal problem not specified> Long Term Goal(s): Improvement in symptoms so as ready for discharge  Short Term Goals:    Medication Management: Evaluate patient's response, side effects, and tolerance of medication regimen.  Therapeutic Interventions: 1 to 1 sessions, Unit Group sessions and Medication administration.  Evaluation of Outcomes: Progressing  Physician Treatment Plan for Secondary Diagnosis: Active  Problems:   Major depressive disorder, recurrent severe without psychotic features ([place])   Long Term Goal(s): Improvement in symptoms so as ready for discharge  Short Term Goals:    Medication Management: Evaluate patient's response, side effects, and tolerance of medication regimen.  Therapeutic Interventions: 1 to 1 sessions, Unit Group sessions and Medication administration.  Evaluation of Outcomes: Progressing   RN Treatment Plan for Primary Diagnosis: <principal problem not specified> Long Term Goal(s): Knowledge of disease and therapeutic regimen to maintain health will improve  Short Term Goals: Ability to disclose and discuss suicidal ideas and Ability to identify and develop effective coping behaviors will improve  Medication Management: RN will administer medications as ordered by provider, will assess and evaluate patient's response and provide education to patient for prescribed medication. RN will report any adverse and/or side effects to prescribing provider.  Therapeutic Interventions: 1 on 1 counseling sessions, Psychoeducation, Medication administration, Evaluate responses to treatment, Monitor vital signs and CBGs as ordered, Perform/monitor CIWA, COWS, AIMS and Fall Risk screenings as ordered, Perform wound care treatments as ordered.  Evaluation of Outcomes: Progressing   LCSW Treatment Plan for Primary Diagnosis: <principal problem not specified> Long Term Goal(s): Safe transition to appropriate next level of care at discharge, Engage patient in therapeutic group addressing interpersonal concerns.  Short Term Goals: Engage patient in aftercare planning with referrals and resources  Therapeutic Interventions: Assess for all discharge needs, 1 to 1 time with Social worker, Explore available resources and support systems, Assess for adequacy in community support network, Educate family and significant other(s) on suicide prevention, Complete Psychosocial Assessment,  Interpersonal group therapy.  Evaluation of Outcomes: Progressing  Pt states he will return home, but based  on medical, mental health presentation, Dr Mallie Darting feels he needs ALF placement   Progress in Treatment: Attending groups: No Participating in groups: No Taking medication as prescribed: Yes Toleration medication: Yes, no side effects reported at this time Family/Significant other contact made: No Patient understands diagnosis: No  Limited insight Discussing patient identified problems/goals with staff: Yes Medical problems stabilized or resolved: Yes Denies suicidal/homicidal ideation: Yes Issues/concerns per patient self-inventory: None Other: N/A  New problem(s) identified: None identified at this time.   New Short Term/Long Term Goal(s): "I have no goals.  Where am I going after here?  I guess I will go back to my apartment.".   Discharge Plan or Barriers:   Reason for Continuation of Hospitalization:  Medication stabilization Suicidal ideation   Estimated Length of Stay: 3-5 days  Attendees: Patient: 02/08/2019  9:18 AM  Physician: , Myles Lipps MD 02/08/2019  9:18 AM  Nursing:  Alyson Locket RN 02/08/2019  9:18 AM  Other: Ricky Ala, PA   Social Worker: Ripley Fraise 02/08/2019  9:18 AM  Recreational Therapist:  02/08/2019  9:18 AM          Scribe for Treatment Team:  Roque Lias LCSW 02/08/2019 9:18 AM

## 2019-02-08 NOTE — [facility] Suicide Risk Assessment (Signed)
[facility] Admission Suicide Risk Assessment   Nursing information obtained from:  Patient Demographic factors:  Age 68 or older Current Mental Status:  Suicide plan Loss Factors:  NA Historical Factors:  NA Risk Reduction Factors:  NA  Total Time spent with patient: 30 minutes Principal Problem: <principal problem not specified> Diagnosis:  Active Problems:   Major depressive disorder, recurrent severe without psychotic features ([place])  Subjective Data: Patient is seen and examined.  Patient is a 68 year old male with a reported past psychiatric history significant for major depression who presented to the [facility] emergency department on 02/07/2019 with suicidal ideation.  The patient is not a great historian, and the story he tells this morning is somewhat vague.  I have taken the information that he is given me as well as that in the chart.  Patient stated that he went to a local park where there was a lake, and he wanted to drown himself.  He had a 10 to 15 pound dumbbell and was asking those around him to push him in.  The police reported there was also a suicide note in his vehicle.  He stated last night as well as this morning that "I did not have the courage to do it".  He stated he was "a coward".  The patient stated he had a history of depression, but had not been admitted to the hospital before.  He stated he had been on multiple antidepressants in the past, and had recently been on one that is advertised on television.  He denied recognizing the names Trintellix, Abilify or Rexulti.  He was placed under involuntary commitment and brought to the emergency room for evaluation and stabilization.  The electronic medical record really did not reveal a great deal of information.  In care everywhere the last visit noted was a urologist in 2014, and his last documented visit at the [facility] health facilities was on 09/19/2018 after a fall.  The only medication on his list at that time  was Metformin at thousand milligrams p.o. twice daily for his diabetes.  He denied any family support.  He denied any marriages or ex-wives or children.  He did admit to helplessness, hopelessness and worthlessness.  He was admitted to the hospital.  Continued Clinical Symptoms:  Alcohol Use Disorder Identification Test Final Score (AUDIT): 4 The "Alcohol Use Disorders Identification Test", Guidelines for Use in Primary Care, Second Edition.  World Pharmacologist [facility]). Score between 0-7:  no or low risk or alcohol related problems. Score between 8-15:  moderate risk of alcohol related problems. Score between 16-19:  high risk of alcohol related problems. Score 20 or above:  warrants further diagnostic evaluation for alcohol dependence and treatment.   CLINICAL FACTORS:   Depression:   Anhedonia Hopelessness Impulsivity Insomnia   Musculoskeletal: Strength & Muscle Tone: decreased Gait & Station: normal Patient leans: N/A  Psychiatric Specialty Exam: Physical Exam  Nursing note and vitals reviewed. Constitutional: He is oriented to person, place, and time. He appears well-developed and well-nourished.  HENT:  Head: Normocephalic and atraumatic.  Respiratory: Effort normal.  Neurological: He is alert and oriented to person, place, and time.    ROS  Blood pressure 124/61, pulse (!) 53, temperature 98.3 F (36.8 C), temperature source Oral, resp. rate 18, height 5\' 9"  (1.753 m), weight 129.7 kg, SpO2 100 %.Body mass index is 42.23 kg/m.  General Appearance: Disheveled  Eye Contact:  Fair  Speech:  Normal Rate  Volume:  Decreased  Mood:  Anxious and Depressed  Affect:  Congruent  Thought Process:  Coherent and Descriptions of Associations: Circumstantial  Orientation:  Full (Time, Place, and Person)  Thought Content:  Logical  Suicidal Thoughts:  No  Homicidal Thoughts:  No  Memory:  Immediate;   Fair Recent;   Fair Remote;   Fair  Judgement:  Impaired  Insight:   Fair  Psychomotor Activity:  Psychomotor Retardation  Concentration:  Concentration: Fair and Attention Span: Fair  Recall:  [employer] of Knowledge:  Fair  Language:  Fair  Akathisia:  Negative  Handed:  Right  AIMS (if indicated):     Assets:  Desire for Improvement Resilience  ADL's:  Intact  Cognition:  WNL  Sleep:  Number of Hours: 4      COGNITIVE FEATURES THAT CONTRIBUTE TO RISK:  Thought constriction (tunnel vision)    SUICIDE RISK:   Moderate:  Frequent suicidal ideation with limited intensity, and duration, some specificity in terms of plans, no associated intent, good self-control, limited dysphoria/symptomatology, some risk factors present, and identifiable protective factors, including available and accessible social support.  PLAN OF CARE: Patient is seen and examined.  Patient is a 68 year old male with the above-stated past medical and psychiatric history who was admitted to the psychiatric unit secondary to depression and suicidal ideation.  He was placed under involuntary commitment by local police after he requested a bystander to help him jump in a lake and drown himself.  He will be admitted to the hospital.  He will be integrated into the milieu.  He will be encouraged to attend groups.  He does not recall any of the medicines he was taking as an outpatient.  He stated it was a relatively new antidepressant.  In the emergency department he was started on Celexa 10 mg p.o. daily, and we will continue that.  Review of his laboratories revealed a significantly elevated glucose of 244, a urinalysis that was significant for 50 mg per DL of glucose, 30 mg per DL of protein, but no bacteria no white blood cells.  His drug screen was completely negative.  Given his age we will order an EKG today.  He will also be placed on sliding scale insulin as well as his metformin given the elevation of his blood sugar.  We will check his sugars multiple times a day until they are stable.   His vital signs are stable, he is afebrile.  He is a bit bradycardic at 53.  Again we will get an EKG and make sure about his rhythm.  He is also already requesting discharge, and have informed him that we would probably want him to stay into the hospital until his suicidal thoughts resolved, and his depression improved.  We will attempt to get in touch with the [facility] clinic on Monday to collect collateral information. I certify that inpatient services furnished can reasonably be expected to improve the patient's condition.   Sharma Covert, MD 02/08/2019, 9:15 AM
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
## 2019-02-08 NOTE — H&P (Signed)
Psychiatric Admission Assessment Adult  Patient Identification: Bradley Griffin MRN:  [id number] Date of Evaluation:  02/08/2019 Chief Complaint:  Major Depression Principal Diagnosis: <principal problem not specified> Diagnosis:  Active Problems:   Major depressive disorder, recurrent severe without psychotic features ([place])  History of Present Illness: Patient is seen and examined.  Patient is a 68 year old male with a reported past psychiatric history significant for major depression who presented to the [facility] emergency department on 02/07/2019 with suicidal ideation.  The patient is not a great historian, and the story he tells this morning is somewhat vague.  I have taken the information that he is given me as well as that in the chart.  Patient stated that he went to a local park where there was a lake, and he wanted to drown himself.  He had a 10 to 15 pound dumbbell and was asking those around him to push him in.  The police reported there was also a suicide note in his vehicle.  He stated last night as well as this morning that "I did not have the courage to do it".  He stated he was "a coward".  The patient stated he had a history of depression, but had not been admitted to the hospital before.  He stated he had been on multiple antidepressants in the past, and had recently been on one that is advertised on television.  He denied recognizing the names Trintellix, Abilify or Rexulti.  He was placed under involuntary commitment and brought to the emergency room for evaluation and stabilization.  The electronic medical record really did not reveal a great deal of information.  In care everywhere the last visit noted was a urologist in 2014, and his last documented visit at the [facility] health facilities was on 09/19/2018 after a fall.  The only medication on his list at that time was Metformin at thousand milligrams p.o. twice daily for his diabetes.  He denied any family support.   He denied any marriages or ex-wives or children.  He did admit to helplessness, hopelessness and worthlessness.  He was admitted to the hospital.  Associated Signs/Symptoms: Depression Symptoms:  depressed mood, anhedonia, psychomotor retardation, fatigue, feelings of worthlessness/guilt, difficulty concentrating, hopelessness, suicidal thoughts with specific plan, anxiety, loss of energy/fatigue, disturbed sleep, weight gain, (Hypo) Manic Symptoms:  Impulsivity, Anxiety Symptoms:  Excessive Worry, Psychotic Symptoms:  Denied PTSD Symptoms: Negative Total Time spent with patient: 30 minutes  Past Psychiatric History: Patient stated been treated as an outpatient for multiple years with multiple medications for depression in the past.  He denied any previous suicide attempts.  He denied any previous psychiatric hospitalizations.  Is the patient at risk to self? Yes.    Has the patient been a risk to self in the past 6 months? Yes.    Has the patient been a risk to self within the distant past? No.  Is the patient a risk to others? No.  Has the patient been a risk to others in the past 6 months? No.  Has the patient been a risk to others within the distant past? No.   Prior Inpatient Therapy:   Prior Outpatient Therapy:    Alcohol Screening: 1. How often do you have a drink containing alcohol?: Monthly or less 2. How many drinks containing alcohol do you have on a typical day when you are drinking?: 1 or 2 3. How often do you have six or more drinks on one occasion?: Never AUDIT-C  Score: 1 4. How often during the last year have you found that you were not able to stop drinking once you had started?: Never 5. How often during the last year have you failed to do what was normally expected from you becasue of drinking?: Never 6. How often during the last year have you needed a first drink in the morning to get yourself going after a heavy drinking session?: Less than monthly 7.  How often during the last year have you had a feeling of guilt of remorse after drinking?: Less than monthly 8. How often during the last year have you been unable to remember what happened the night before because you had been drinking?: Less than monthly 9. Have you or someone else been injured as a result of your drinking?: No 10. Has a relative or friend or a doctor or another health worker been concerned about your drinking or suggested you cut down?: No Alcohol Use Disorder Identification Test Final Score (AUDIT): 4 Alcohol Brief Interventions/Follow-up: Alcohol Education Substance Abuse History in the last 12 months:  No. Consequences of Substance Abuse: Negative Previous Psychotropic Medications: Yes  Psychological Evaluations: Yes  Past Medical History:  Past Medical History:  Diagnosis Date  . Blindness of left eye   . Cancer [facility])    Skin cancer     Past Surgical History:  Procedure Laterality Date  . CHOLECYSTECTOMY     Family History: History reviewed. No pertinent family history. Family Psychiatric  History: Noncontributory Tobacco Screening:   Social History:  Social History   Substance and Sexual Activity  Alcohol Use Never  . Frequency: Never     Social History   Substance and Sexual Activity  Drug Use Never    Additional Social History:                           Allergies:  No Known Allergies Lab Results:  Results for orders placed or performed during the hospital encounter of 02/07/19 (from the past 48 hour(s))  CBC     Status: None   Collection Time: 02/07/19  2:32 PM  Result Value Ref Range   WBC 9.8 4.0 - 10.5 K/uL   RBC 4.86 4.22 - 5.81 MIL/uL   Hemoglobin 15.4 13.0 - 17.0 g/dL   HCT 45.4 39.0 - 52.0 %   MCV 93.4 80.0 - 100.0 fL   MCH 31.7 26.0 - 34.0 pg   MCHC 33.9 30.0 - 36.0 g/dL   RDW 13.8 11.5 - 15.5 %   Platelets 267 150 - 400 K/uL   nRBC 0.0 0.0 - 0.2 %    Comment: Performed at [facility], [address] [place], [place] [id number]  Basic metabolic panel     Status: Abnormal   Collection Time: 02/07/19  2:32 PM  Result Value Ref Range   Sodium 135 135 - 145 mmol/L   Potassium 3.7 3.5 - 5.1 mmol/L   Chloride 101 98 - 111 mmol/L   CO2 21 (L) 22 - 32 mmol/L   Glucose, Bld 244 (H) 70 - 99 mg/dL   BUN 14 8 - 23 mg/dL   Creatinine, Ser 0.83 0.61 - 1.24 mg/dL   Calcium 8.9 8.9 - 10.3 mg/dL   GFR calc non Af Amer >60 >60 mL/min   GFR calc Af Amer >60 >60 mL/min   Anion gap 13 5 - 15    Comment: Performed at [facility], [address] [place], [place] [address]  Acetaminophen level     Status: Abnormal   Collection Time: 02/07/19  2:32 PM  Result Value Ref Range   Acetaminophen (Tylenol), Serum <10 (L) 10 - 30 ug/mL    Comment: (NOTE) Therapeutic concentrations vary significantly. A range of 10-30 ug/mL  may be an effective concentration for many patients. However, some  are best treated at concentrations outside of this range. Acetaminophen concentrations >150 ug/mL at 4 hours after ingestion  and >50 ug/mL at 12 hours after ingestion are often associated with  toxic reactions. Performed at [facility], [address] [place], [place] [id number]   Salicylate level     Status: None   Collection Time: 02/07/19  2:32 PM  Result Value Ref Range   Salicylate Lvl [id number] 2.8 - 30.0 mg/dL    Comment: Performed at [facility], [address] [place], [place] [address]  TSH     Status: None   Collection Time: 02/07/19  2:32 PM  Result Value Ref Range   TSH 2.422 0.350 - 4.500 uIU/mL    Comment: Performed by a 3rd Generation assay with a functional sensitivity of <=0.01 uIU/mL. Performed at [facility], [address] [place], [place] [address]   Urine Drug Screen, Qualitative [facility] only)     Status: None   Collection Time: 02/07/19  5:00 PM  Result Value Ref Range   Tricyclic, Ur Screen NONE DETECTED NONE DETECTED   Amphetamines, Ur Screen NONE  DETECTED NONE DETECTED   MDMA (Ecstasy)Ur Screen NONE DETECTED NONE DETECTED   Cocaine Metabolite,Ur [place] NONE DETECTED NONE DETECTED   Opiate, Ur Screen NONE DETECTED NONE DETECTED   Phencyclidine (PCP) Ur S NONE DETECTED NONE DETECTED   Cannabinoid 50 Ng, Ur [place] NONE DETECTED NONE DETECTED   Barbiturates, Ur Screen NONE DETECTED NONE DETECTED   Benzodiazepine, Ur Scrn NONE DETECTED NONE DETECTED   Methadone Scn, Ur NONE DETECTED NONE DETECTED    Comment: (NOTE) Tricyclics + metabolites, urine    Cutoff 1000 ng/mL Amphetamines + metabolites, urine  Cutoff 1000 ng/mL MDMA (Ecstasy), urine              Cutoff 500 ng/mL Cocaine Metabolite, urine          Cutoff 300 ng/mL Opiate + metabolites, urine        Cutoff 300 ng/mL Phencyclidine (PCP), urine         Cutoff 25 ng/mL Cannabinoid, urine                 Cutoff 50 ng/mL Barbiturates + metabolites, urine  Cutoff 200 ng/mL Benzodiazepine, urine              Cutoff 200 ng/mL Methadone, urine                   Cutoff 300 ng/mL The urine drug screen provides only a preliminary, unconfirmed analytical test result and should not be used for non-medical purposes. Clinical consideration and professional judgment should be applied to any positive drug screen result due to possible interfering substances. A more specific alternate chemical method must be used in order to obtain a confirmed analytical result. Gas chromatography / mass spectrometry (GC/MS) is the preferred confirmat ory method. Performed at [facility], [address] [place], [place] [address]   Urinalysis, Complete w Microscopic     Status: Abnormal   Collection Time: 02/07/19  5:00 PM  Result Value Ref Range   Color,  Urine AMBER (A) YELLOW    Comment: BIOCHEMICALS MAY BE AFFECTED BY COLOR   APPearance TURBID (A) CLEAR   Specific Gravity, Urine 1.025 1.005 - 1.030   pH 6.0 5.0 - 8.0   Glucose, UA 50 (A) NEGATIVE mg/dL   Hgb urine dipstick NEGATIVE NEGATIVE    Bilirubin Urine NEGATIVE NEGATIVE   Ketones, ur NEGATIVE NEGATIVE mg/dL   Protein, ur 30 (A) NEGATIVE mg/dL   Nitrite NEGATIVE NEGATIVE   Leukocytes,Ua NEGATIVE NEGATIVE   RBC / HPF 0-5 0 - 5 RBC/hpf   WBC, UA 0-5 0 - 5 WBC/hpf   Bacteria, UA NONE SEEN NONE SEEN   Squamous Epithelial / LPF NONE SEEN 0 - 5   Mucus PRESENT     Comment: Performed at [facility], [address], [place], [place] [address]  SARS Coronavirus 2 [facility] order, Performed in [facility] hospital lab) Nasopharyngeal Nasopharyngeal Swab     Status: None   Collection Time: 02/07/19 10:24 PM   Specimen: Nasopharyngeal Swab  Result Value Ref Range   SARS Coronavirus 2 NEGATIVE NEGATIVE    Comment: (NOTE) If result is NEGATIVE SARS-CoV-2 target nucleic acids are NOT DETECTED. The SARS-CoV-2 RNA is generally detectable in upper and lower  respiratory specimens during the acute phase of infection. The lowest  concentration of SARS-CoV-2 viral copies this assay can detect is 250  copies / mL. A negative result does not preclude SARS-CoV-2 infection  and should not be used as the sole basis for treatment or other  patient management decisions.  A negative result may occur with  improper specimen collection / handling, submission of specimen other  than nasopharyngeal swab, presence of viral mutation(s) within the  areas targeted by this assay, and inadequate number of viral copies  (<250 copies / mL). A negative result must be combined with clinical  observations, patient history, and epidemiological information. If result is POSITIVE SARS-CoV-2 target nucleic acids are DETECTED. The SARS-CoV-2 RNA is generally detectable in upper and lower  respiratory specimens dur ing the acute phase of infection.  Positive  results are indicative of active infection with SARS-CoV-2.  Clinical  correlation with patient history and other diagnostic information is  necessary to determine patient infection status.   Positive results do  not rule out bacterial infection or co-infection with other viruses. If result is PRESUMPTIVE POSTIVE SARS-CoV-2 nucleic acids MAY BE PRESENT.   A presumptive positive result was obtained on the submitted specimen  and confirmed on repeat testing.  While 2019 novel coronavirus  (SARS-CoV-2) nucleic acids may be present in the submitted sample  additional confirmatory testing may be necessary for epidemiological  and / or clinical management purposes  to differentiate between  SARS-CoV-2 and other Sarbecovirus currently known to infect humans.  If clinically indicated additional testing with an alternate test  methodology [phone]) is advised. The SARS-CoV-2 RNA is generally  detectable in upper and lower respiratory sp ecimens during the acute  phase of infection. The expected result is Negative. Fact Sheet for Patients:  StrictlyIdeas.no Fact Sheet for Healthcare Providers: BankingDealers.co.za This test is not yet approved or cleared by the [place] FDA and has been authorized for detection and/or diagnosis of SARS-CoV-2 by FDA under an Emergency Use Authorization (EUA).  This EUA will remain in effect (meaning this test can be used) for the duration of the COVID-19 declaration under Section 564(b)(1) of the Act, 21 U.S.C. section 360bbb-3(b)(1), unless the authorization is terminated or revoked sooner. Performed at  [facility], [address], [place], [place] [address]     Blood Alcohol level:  No results found for: [facility]  Metabolic Disorder Labs:  Lab Results  Component Value Date   HGBA1C 10.7 (H) 09/19/2018   MPG 260.39 09/19/2018   No results found for: PROLACTIN No results found for: CHOL, TRIG, HDL, CHOLHDL, VLDL, LDLCALC  Current Medications: Current Facility-Administered Medications  Medication Dose Route Frequency Provider Last Rate Last Dose  . acetaminophen (TYLENOL) tablet  650 mg  650 mg Oral Q6H PRN Patrecia Pour, NP      . alum & mag hydroxide-simeth (MAALOX/MYLANTA) 200-200-20 MG/5ML suspension 30 mL  30 mL Oral Q4H PRN Patrecia Pour, NP      . citalopram (CELEXA) tablet 10 mg  10 mg Oral Daily Patrecia Pour, NP   10 mg at 02/08/19 [id number]  . insulin aspart (novoLOG) injection 0-15 Units  0-15 Units Subcutaneous TID WC Clary, Cordie Grice, MD      . insulin aspart (novoLOG) injection 0-5 Units  0-5 Units Subcutaneous QHS Sharma Covert, MD      . magnesium hydroxide (MILK OF MAGNESIA) suspension 30 mL  30 mL Oral Daily PRN Patrecia Pour, NP      . metFORMIN (GLUCOPHAGE) tablet 1,000 mg  1,000 mg Oral BID WC Patrecia Pour, NP   1,000 mg at 02/08/19 [id number]   PTA Medications: No medications prior to admission.    Musculoskeletal: Strength & Muscle Tone: decreased Gait & Station: shuffle Patient leans: N/A  Psychiatric Specialty Exam: Physical Exam  Nursing note and vitals reviewed. Constitutional: He is oriented to person, place, and time. He appears well-developed and well-nourished.  HENT:  Head: Normocephalic and atraumatic.  Respiratory: Effort normal.  Neurological: He is alert and oriented to person, place, and time.    ROS  Blood pressure 124/61, pulse (!) 53, temperature 98.3 F (36.8 C), temperature source Oral, resp. rate 18, height 5\' 9"  (1.753 m), weight 129.7 kg, SpO2 100 %.Body mass index is 42.23 kg/m.  General Appearance: Disheveled  Eye Contact:  Minimal  Speech:  Normal Rate  Volume:  Decreased  Mood:  Depressed  Affect:  Congruent  Thought Process:  Coherent and Descriptions of Associations: Circumstantial  Orientation:  Full (Time, Place, and Person)  Thought Content:  Rumination  Suicidal Thoughts:  Yes.  with intent/plan  Homicidal Thoughts:  No  Memory:  Immediate;   Fair Recent;   Fair Remote;   Fair  Judgement:  Impaired  Insight:  Lacking  Psychomotor Activity:  Decreased  Concentration:  Concentration:  Fair and Attention Span: Fair  Recall:  [employer] of Knowledge:  Fair  Language:  Fair  Akathisia:  Negative  Handed:  Right  AIMS (if indicated):     Assets:  Desire for Improvement Resilience  ADL's:  Impaired  Cognition:  WNL  Sleep:  Number of Hours: 4    Treatment Plan Summary: Daily contact with patient to assess and evaluate symptoms and progress in treatment, Medication management and Plan : Patient is seen and examined.  Patient is a 68 year old male with the above-stated past medical and psychiatric history who was admitted to the psychiatric unit secondary to depression and suicidal ideation.  He was placed under involuntary commitment by local police after he requested a bystander to help him jump in a lake and drown himself.  He will be admitted to the hospital.  He will be integrated into the milieu.  He will be encouraged to attend groups.  He does not recall any of the medicines he was taking as an outpatient.  He stated it was a relatively new antidepressant.  In the emergency department he was started on Celexa 10 mg p.o. daily, and we will continue that.  Review of his laboratories revealed a significantly elevated glucose of 244, a urinalysis that was significant for 50 mg per DL of glucose,  Observation Level/Precautions:  Fall 15 minute checks  Laboratory:  Chemistry Profile  Psychotherapy:    Medications:    Consultations:    Discharge Concerns:    Estimated LOS:  Other:     Physician Treatment Plan for Primary Diagnosis: <principal problem not specified> Long Term Goal(s): Improvement in symptoms so as ready for discharge  Short Term Goals: Ability to identify changes in lifestyle to reduce recurrence of condition will improve, Ability to verbalize feelings will improve, Ability to disclose and discuss suicidal ideas, Ability to demonstrate self-control will improve, Ability to identify and develop effective coping behaviors will improve and Ability to maintain  clinical measurements within normal limits will improve  Physician Treatment Plan for Secondary Diagnosis: Active Problems:   Major depressive disorder, recurrent severe without psychotic features ([place])  Long Term Goal(s): Improvement in symptoms so as ready for discharge  Short Term Goals: Ability to identify changes in lifestyle to reduce recurrence of condition will improve, Ability to verbalize feelings will improve, Ability to disclose and discuss suicidal ideas, Ability to demonstrate self-control will improve, Ability to identify and develop effective coping behaviors will improve and Ability to maintain clinical measurements within normal limits will improve  I certify that inpatient services furnished can reasonably be expected to improve the patient's condition.    Sharma Covert, MD 9/12/20201:28 PM

## 2019-02-09 MED ORDER — INFLUENZA VAC A&B SA ADJ QUAD 0.5 ML IM PRSY
0.5000 mL | PREFILLED_SYRINGE | INTRAMUSCULAR | Status: DC
  Filled 2019-02-09: qty 0.5

## 2019-02-09 MED ORDER — PNEUMOCOCCAL VAC POLYVALENT 25 MCG/0.5ML IJ INJ
0.5000 mL | INJECTION | INTRAMUSCULAR | Status: DC
  Filled 2019-02-09: qty 0.5

## 2019-02-09 MED ORDER — CITALOPRAM HYDROBROMIDE 20 MG PO TABS
20.0000 mg | ORAL_TABLET | Freq: Every day | ORAL | Status: DC
  Administered 2019-02-10 – 2019-02-20 (×11): 20 mg via ORAL
  Filled 2019-02-09 (×11): qty 1

## 2019-02-09 MED ORDER — TRAZODONE HCL 50 MG PO TABS
50.0000 mg | ORAL_TABLET | Freq: Every evening | ORAL | Status: DC | PRN
  Filled 2019-02-09: qty 1

## 2019-02-09 NOTE — Progress Notes (Addendum)
Patient alert and oriented x 4, affect is blunted and irritable he denies SI/HI/AVH , isolates to self not interacting with peers and staff, he appears flat, sad and apprehensive, he was not receptive to staff, his thoughts are organized , he makes appropriate eye contact, speech is soft non pressured, patient during medication time refused for staff to checks his blood glucose he stated " I don't want it, I don't do that " 15 minutes safety checks maintained will continue to monitor

## 2019-02-09 NOTE — BHH Counselor (Signed)
Adult Comprehensive Assessment  Patient ID: Bradley Griffin, male   DOB: January 19, 1951, 68 y.o.   MRN: [id number]  Information Source: Information source: Patient  Current Stressors:  Patient states their primary concerns and needs for treatment are:: "I wanted to kill myself" Patient states their goals for this hospitilization and ongoing recovery are:: "I don't have one" Educational / Learning stressors: Pt has a BA in philosophy Employment / Job issues: Pt unemployed Family Relationships: Pt reports good Diplomatic Services operational officer / Lack of resources (include bankruptcy): Pt reports he has no income Housing / Lack of housing: Pt lives alone Substance abuse: Pt denies Bereavement / Loss: both pts parents are currently deceased  Living/Environment/Situation:  Living Arrangements: Alone Living conditions (as described by patient or guardian): "calm" Who else lives in the home?: Pt lives alone How long has patient lived in current situation?: "I don't know"  Family History:  Marital status: Single What is your sexual orientation?: heterosexual Has your sexual activity been affected by drugs, alcohol, medication, or emotional stress?: pt denies Does patient have children?: No  Childhood History:  By whom was/is the patient raised?: Both parents Description of patient's relationship with caregiver when they were a child: "good" Patient's description of current relationship with people who raised him/her: "they're both dead" Does patient have siblings?: Yes Number of Siblings: 1 Description of patient's current relationship with siblings: Pt reports having a brother and having "no relationship" with him Did patient suffer any verbal/emotional/physical/sexual abuse as a child?: No Did patient suffer from severe childhood neglect?: No Has patient ever been sexually abused/assaulted/raped as an adolescent or adult?: No Was the patient ever a victim of a crime or a disaster?: No Witnessed  domestic violence?: No Has patient been effected by domestic violence as an adult?: No  Education:  Highest grade of school patient has completed: BA degree in philosophy Currently a student?: No Learning disability?: No  Employment/Work Situation:   Employment situation: Unemployed("for a long time") What is the longest time patient has a held a job?: 1 year Where was the patient employed at that time?: "I don't remember" Did You Receive Any Psychiatric Treatment/Services While in the Military?: No Are There Guns or Other Weapons in [place]?: No Are These Psychologist, educational?: (N/A)  Financial Resources:   Financial resources: No income Does patient have a Programmer, applications or guardian?: No  Alcohol/Substance Abuse:   What has been your use of drugs/alcohol within the last 12 months?: Pt denies If attempted suicide, did drugs/alcohol play a role in this?: No Alcohol/Substance Abuse Treatment Hx: Denies past history Has alcohol/substance abuse ever caused legal problems?: No  Social Support System:   Patient's Community Support System: Good Describe Community Support System: Pt reports a good support system Type of faith/religion: Christian How does patient's faith help to cope with current illness?: "pray"  Leisure/Recreation:   Leisure and Hobbies: "I don't know"  Strengths/Needs:   What is the patient's perception of their strengths?: "nothing" Patient states these barriers may affect/interfere with their treatment: none reported Patient states these barriers may affect their return to the community: none reported Other important information patient would like considered in planning for their treatment: Pt declines any follow up treatment at this time  Discharge Plan:   Currently receiving community mental health services: No Patient states concerns and preferences for aftercare planning are: Pt declines any referrals for follow up treatment Patient states they  will know when they are safe and ready for  discharge when: "I don't know" Does patient have access to transportation?: No Does patient have financial barriers related to discharge medications?: No Plan for no access to transportation at discharge: Taxi or bus pass will be provided Will patient be returning to same living situation after discharge?: Yes  Summary/Recommendations:   Summary and Recommendations (to be completed by the evaluator): Pt is a 68 yo [place] [facility] [place] [place] [facility]. Pt presents to the hospital seeking treatment for SI, medication stabilization, and depression. Pt has a diagnosis of MDD, recurrent severe without psychotic features. Pt is single, unemployed, has no children, reports a good support system, and denies history of abuse/trauma. Pt denies SI/HI/AVH currently. Pt currently declines referral for follow up treatment at discharge. Recommendations for pt include: crisis stabilization, therapeutic milieu, encourage group attendance and participation, medication management for mood stabilization, and development for comprehensive mental wellness plan. CSW assessing for appropriate referrals.  [place] MSW LCSW 02/09/2019 9:33 AM

## 2019-02-09 NOTE — Progress Notes (Signed)
Pt declines referral for follow up treatment at this time. A list of local resources will be provided to pt at discharge.  Evalina Field, MSW, LCSW Clinical Social Work 02/09/2019 9:34 AM

## 2019-02-09 NOTE — Progress Notes (Signed)
Patient has been asked multiple times to comply with needed EKG testing, but had adamantly refused stating, "I already know I don't have anything wrong, I've been tested before".

## 2019-02-09 NOTE — Plan of Care (Signed)
D: Pt. During assessments admits to having suicidal thoughts and intent, but no active plan or intent on unit. Pt. Verbalizes he can stay safe on the unit. Pt. Denies hi/avh. Pt. Denies pain. Pt. During assessments sitting in his room withdrawn and isolative. Pt. Describes a dysphoric mood. Pt. Is overall guarded and minimal.   A: Q x 15 minute observation checks in place for safety. Patient was and is provided with education throughout shift. Patient was and will be given/offered medications per orders. Patient was and is encouraged to attend groups, participate in unit activities and continue with plan of care. Pt. Chart and plans of care reviewed. Pt. Given support and encouragement.   R: Patient is non-complaint with medications and required testings today per MD orders. Provider is aware of resistance to care. Pt. Declines to utilize safety equipment for ambulation, of note, patient has been observed ambulating fair, just shuffles and is slow at times. Pt. Attends all meals, observed eating good, but refuses to attend to hygiene. Pt. Besides eating, appears to be failure to thrive.    Problem: Education: Goal: Ability to make informed decisions regarding treatment will improve Outcome: Not Progressing   Problem: Coping: Goal: Coping ability will improve Outcome: Not Progressing   Problem: Health Behavior/Discharge Planning: Goal: Identification of resources available to assist in meeting health care needs will improve Outcome: Not Progressing   Problem: Medication: Goal: Compliance with prescribed medication regimen will improve Outcome: Not Progressing   Problem: Self-Concept: Goal: Ability to disclose and discuss suicidal ideas will improve Outcome: Not Progressing Goal: Will verbalize positive feelings about self Outcome: Not Progressing   Problem: Education: Goal: Utilization of techniques to improve thought processes will improve Outcome: Not Progressing Goal: Knowledge of  the prescribed therapeutic regimen will improve Outcome: Not Progressing   Problem: Activity: Goal: Interest or engagement in leisure activities will improve Outcome: Not Progressing Goal: Imbalance in normal sleep/wake cycle will improve Outcome: Not Progressing   Problem: Coping: Goal: Coping ability will improve Outcome: Not Progressing Goal: Will verbalize feelings Outcome: Not Progressing   Problem: Health Behavior/Discharge Planning: Goal: Ability to make decisions will improve Outcome: Not Progressing Goal: Compliance with therapeutic regimen will improve Outcome: Not Progressing   Problem: Role Relationship: Goal: Will demonstrate positive changes in social behaviors and relationships Outcome: Not Progressing   Problem: Safety: Goal: Ability to disclose and discuss suicidal ideas will improve Outcome: Not Progressing Goal: Ability to identify and utilize support systems that promote safety will improve Outcome: Not Progressing   Problem: Self-Concept: Goal: Will verbalize positive feelings about self Outcome: Not Progressing Goal: Level of anxiety will decrease Outcome: Not Progressing   Problem: Health Behavior/Discharge Planning: Goal: Identification of resources available to assist in meeting health care needs will improve Outcome: Not Progressing   Problem: Self-Concept: Goal: Expressions of positive opinion of body image will increase Outcome: Not Progressing Goal: Will verbalize positive feelings about self Outcome: Not Progressing   Problem: Education: Goal: Ability to state activities that reduce stress will improve Outcome: Not Progressing   Problem: Self-Concept: Goal: Ability to identify factors that promote anxiety will improve Outcome: Not Progressing Goal: Level of anxiety will decrease Outcome: Not Progressing Goal: Ability to modify response to factors that promote anxiety will improve Outcome: Not Progressing

## 2019-02-09 NOTE — Progress Notes (Signed)
D: Patient continues to refuse his CBG. States that he does not need it that his blood sugar is fine. Denies SI, but patient is unable to convincingly contract for safety. Patient has showered and changed clothes. Body odor is much improved although patient still appears disheveled and hygiene is still poor. Site where melanoma was removed is scabbed over on the right side of patient's head. Patient denies pain. Voices no complaints. Mood is sad. Affect is appropriate to circumstance. A: Continue to monitor for safety. R: Safety maintained.

## 2019-02-09 NOTE — Progress Notes (Signed)
Bradley Griffin Progress Note  02/09/2019 10:24 AM Bradley Griffin  MRN:  [id number] Subjective: Patient is a 68 year old male with a past psychiatric history significant for major depression who presented to the [facility] emergency department on 9/11 under involuntary commitment for suicidal ideation.  Objective: Patient is seen and examined.  Patient is a 68 year old male with the above-stated past psychiatric history who is seen in follow-up.  He is essentially unchanged today.  He remains depressed.  He did deny any suicidal ideation.  His fatigue also continues.  He did admit today that he is being evicted from his apartment, and housing will be a problem for him after discharge.  A physical therapy consultation was written for yesterday, and is still pending.  Did a chest x-ray yesterday secondary to his dyspnea, and his chest x-ray was essentially negative.  His old records show that he does have a history of diabetes, but he refused blood sugar checks and so we are unsure what ever his blood sugar is this morning.  His vital signs are stable, he is afebrile.  He slept 4 hours last night.  Principal Problem: <principal problem not specified> Diagnosis: Active Problems:   Major depressive disorder, recurrent severe without psychotic features (Bradley Griffin)  Total Time spent with patient: 20 minutes  Past Psychiatric History: See admission H&P  Past Medical History:  Past Medical History:  Diagnosis Date  . Blindness of left eye   . Cancer [facility])    Skin cancer     Past Surgical History:  Procedure Laterality Date  . CHOLECYSTECTOMY     Family History: History reviewed. No pertinent family history. Family Psychiatric  History: See admission H&P Social History:  Social History   Substance and Sexual Activity  Alcohol Use Never  . Frequency: Never     Social History   Substance and Sexual Activity  Drug Use Never    Social History   Socioeconomic History  . Marital  status: Single    Spouse name: Not on file  . Number of children: Not on file  . Years of education: Not on file  . Highest education level: Not on file  Occupational History  . Not on file  Social Needs  . Financial resource strain: Not on file  . Food insecurity    Worry: Not on file    Inability: Not on file  . Transportation needs    Medical: Not on file    Non-medical: Not on file  Tobacco Use  . Smoking status: Never Smoker  . Smokeless tobacco: Never Used  Substance and Sexual Activity  . Alcohol use: Never    Frequency: Never  . Drug use: Never  . Sexual activity: Not Currently  Lifestyle  . Physical activity    Days per week: Not on file    Minutes per session: Not on file  . Stress: Not on file  Relationships  . Social Herbalist on phone: Not on file    Gets together: Not on file    Attends religious service: Not on file    Active member of club or organization: Not on file    Attends meetings of clubs or organizations: Not on file    Relationship status: Not on file  Other Topics Concern  . Not on file  Social History Narrative  . Not on file   Additional Social History:  Sleep: Poor  Appetite:  Fair  Current Medications: Current Facility-Administered Medications  Medication Dose Route Frequency Provider Last Rate Last Dose  . acetaminophen (TYLENOL) tablet 650 mg  650 mg Oral Q6H PRN Patrecia Pour, NP      . alum & mag hydroxide-simeth (MAALOX/MYLANTA) 200-200-20 MG/5ML suspension 30 mL  30 mL Oral Q4H PRN Patrecia Pour, NP      . citalopram (CELEXA) tablet 10 mg  10 mg Oral Daily Patrecia Pour, NP   10 mg at 02/09/19 [id number]  . insulin aspart (novoLOG) injection 0-15 Units  0-15 Units Subcutaneous TID WC Sharma Covert, Griffin      . insulin aspart (novoLOG) injection 0-5 Units  0-5 Units Subcutaneous QHS Sharma Covert, Griffin      . magnesium hydroxide (MILK OF MAGNESIA) suspension 30 mL  30 mL Oral  Daily PRN Patrecia Pour, NP      . metFORMIN (GLUCOPHAGE) tablet 1,000 mg  1,000 mg Oral BID WC Patrecia Pour, NP   1,000 mg at 02/09/19 [id number]    Lab Results:  Results for orders placed or performed during the hospital encounter of 02/07/19 (from the past 48 hour(s))  CBC     Status: None   Collection Time: 02/07/19  2:32 PM  Result Value Ref Range   WBC 9.8 4.0 - 10.5 K/uL   RBC 4.86 4.22 - 5.81 MIL/uL   Hemoglobin 15.4 13.0 - 17.0 g/dL   HCT 45.4 39.0 - 52.0 %   MCV 93.4 80.0 - 100.0 fL   MCH 31.7 26.0 - 34.0 pg   MCHC 33.9 30.0 - 36.0 g/dL   RDW 13.8 11.5 - 15.5 %   Platelets 267 150 - 400 K/uL   nRBC 0.0 0.0 - 0.2 %    Comment: Performed at [facility], [address] [place], [place] [id number]  Basic metabolic panel     Status: Abnormal   Collection Time: 02/07/19  2:32 PM  Result Value Ref Range   Sodium 135 135 - 145 mmol/L   Potassium 3.7 3.5 - 5.1 mmol/L   Chloride 101 98 - 111 mmol/L   CO2 21 (L) 22 - 32 mmol/L   Glucose, Bld 244 (H) 70 - 99 mg/dL   BUN 14 8 - 23 mg/dL   Creatinine, Ser 0.83 0.61 - 1.24 mg/dL   Calcium 8.9 8.9 - 10.3 mg/dL   GFR calc non Af Amer >60 >60 mL/min   GFR calc Af Amer >60 >60 mL/min   Anion gap 13 5 - 15    Comment: Performed at [facility], [address], [place], [place] [address]  Acetaminophen level     Status: Abnormal   Collection Time: 02/07/19  2:32 PM  Result Value Ref Range   Acetaminophen (Tylenol), Serum <10 (L) 10 - 30 ug/mL    Comment: (NOTE) Therapeutic concentrations vary significantly. A range of 10-30 ug/mL  may be an effective concentration for many patients. However, some  are best treated at concentrations outside of this range. Acetaminophen concentrations >150 ug/mL at 4 hours after ingestion  and >50 ug/mL at 12 hours after ingestion are often associated with  toxic reactions. Performed at [facility], [address] [place], [place] [id number]   Salicylate level      Status: None   Collection Time: 02/07/19  2:32 PM  Result Value Ref Range   Salicylate Lvl [id number] 2.8 - 30.0 mg/dL    Comment:  Performed at [facility], [address] [place], [place] [address]  TSH     Status: None   Collection Time: 02/07/19  2:32 PM  Result Value Ref Range   TSH 2.422 0.350 - 4.500 uIU/mL    Comment: Performed by a 3rd Generation assay with a functional sensitivity of <=0.01 uIU/mL. Performed at [facility], [address] [place], [place] [address]   Urine Drug Screen, Qualitative [facility] only)     Status: None   Collection Time: 02/07/19  5:00 PM  Result Value Ref Range   Tricyclic, Ur Screen NONE DETECTED NONE DETECTED   Amphetamines, Ur Screen NONE DETECTED NONE DETECTED   MDMA (Ecstasy)Ur Screen NONE DETECTED NONE DETECTED   Cocaine Metabolite,Ur [place] NONE DETECTED NONE DETECTED   Opiate, Ur Screen NONE DETECTED NONE DETECTED   Phencyclidine (PCP) Ur S NONE DETECTED NONE DETECTED   Cannabinoid 50 Ng, Ur [place] NONE DETECTED NONE DETECTED   Barbiturates, Ur Screen NONE DETECTED NONE DETECTED   Benzodiazepine, Ur Scrn NONE DETECTED NONE DETECTED   Methadone Scn, Ur NONE DETECTED NONE DETECTED    Comment: (NOTE) Tricyclics + metabolites, urine    Cutoff 1000 ng/mL Amphetamines + metabolites, urine  Cutoff 1000 ng/mL MDMA (Ecstasy), urine              Cutoff 500 ng/mL Cocaine Metabolite, urine          Cutoff 300 ng/mL Opiate + metabolites, urine        Cutoff 300 ng/mL Phencyclidine (PCP), urine         Cutoff 25 ng/mL Cannabinoid, urine                 Cutoff 50 ng/mL Barbiturates + metabolites, urine  Cutoff 200 ng/mL Benzodiazepine, urine              Cutoff 200 ng/mL Methadone, urine                   Cutoff 300 ng/mL The urine drug screen provides only a preliminary, unconfirmed analytical test result and should not be used for non-medical purposes. Clinical consideration and professional judgment should be applied to any positive drug  screen result due to possible interfering substances. A more specific alternate chemical method must be used in order to obtain a confirmed analytical result. Gas chromatography / mass spectrometry (GC/MS) is the preferred confirmat ory method. Performed at [facility], [address] [place], [place] [address]   Urinalysis, Complete w Microscopic     Status: Abnormal   Collection Time: 02/07/19  5:00 PM  Result Value Ref Range   Color, Urine AMBER (A) YELLOW    Comment: BIOCHEMICALS MAY BE AFFECTED BY COLOR   APPearance TURBID (A) CLEAR   Specific Gravity, Urine 1.025 1.005 - 1.030   pH 6.0 5.0 - 8.0   Glucose, UA 50 (A) NEGATIVE mg/dL   Hgb urine dipstick NEGATIVE NEGATIVE   Bilirubin Urine NEGATIVE NEGATIVE   Ketones, ur NEGATIVE NEGATIVE mg/dL   Protein, ur 30 (A) NEGATIVE mg/dL   Nitrite NEGATIVE NEGATIVE   Leukocytes,Ua NEGATIVE NEGATIVE   RBC / HPF 0-5 0 - 5 RBC/hpf   WBC, UA 0-5 0 - 5 WBC/hpf   Bacteria, UA NONE SEEN NONE SEEN   Squamous Epithelial / LPF NONE SEEN 0 - 5   Mucus PRESENT     Comment: Performed at [facility], [address] [place], [place] [address]  SARS Coronavirus 2 [facility]  order, Performed in [facility] hospital lab) Nasopharyngeal Nasopharyngeal Swab     Status: None   Collection Time: 02/07/19 10:24 PM   Specimen: Nasopharyngeal Swab  Result Value Ref Range   SARS Coronavirus 2 NEGATIVE NEGATIVE    Comment: (NOTE) If result is NEGATIVE SARS-CoV-2 target nucleic acids are NOT DETECTED. The SARS-CoV-2 RNA is generally detectable in upper and lower  respiratory specimens during the acute phase of infection. The lowest  concentration of SARS-CoV-2 viral copies this assay can detect is 250  copies / mL. A negative result does not preclude SARS-CoV-2 infection  and should not be used as the sole basis for treatment or other  patient management decisions.  A negative result may occur with  improper specimen collection /  handling, submission of specimen other  than nasopharyngeal swab, presence of viral mutation(s) within the  areas targeted by this assay, and inadequate number of viral copies  (<250 copies / mL). A negative result must be combined with clinical  observations, patient history, and epidemiological information. If result is POSITIVE SARS-CoV-2 target nucleic acids are DETECTED. The SARS-CoV-2 RNA is generally detectable in upper and lower  respiratory specimens dur ing the acute phase of infection.  Positive  results are indicative of active infection with SARS-CoV-2.  Clinical  correlation with patient history and other diagnostic information is  necessary to determine patient infection status.  Positive results do  not rule out bacterial infection or co-infection with other viruses. If result is PRESUMPTIVE POSTIVE SARS-CoV-2 nucleic acids MAY BE PRESENT.   A presumptive positive result was obtained on the submitted specimen  and confirmed on repeat testing.  While 2019 novel coronavirus  (SARS-CoV-2) nucleic acids may be present in the submitted sample  additional confirmatory testing may be necessary for epidemiological  and / or clinical management purposes  to differentiate between  SARS-CoV-2 and other Sarbecovirus currently known to infect humans.  If clinically indicated additional testing with an alternate test  methodology [phone]) is advised. The SARS-CoV-2 RNA is generally  detectable in upper and lower respiratory sp ecimens during the acute  phase of infection. The expected result is Negative. Fact Sheet for Patients:  StrictlyIdeas.no Fact Sheet for Healthcare Providers: BankingDealers.co.za This test is not yet approved or cleared by the [place] FDA and has been authorized for detection and/or diagnosis of SARS-CoV-2 by FDA under an Emergency Use Authorization (EUA).  This EUA will remain in effect (meaning this  test can be used) for the duration of the COVID-19 declaration under Section 564(b)(1) of the Act, 21 U.S.C. section 360bbb-3(b)(1), unless the authorization is terminated or revoked sooner. Performed at [facility], [address] [place], [place] [address]     Blood Alcohol level:  No results found for: [facility]  Metabolic Disorder Labs: Lab Results  Component Value Date   HGBA1C 10.7 (H) 09/19/2018   MPG 260.39 09/19/2018   No results found for: PROLACTIN No results found for: CHOL, TRIG, HDL, CHOLHDL, VLDL, LDLCALC  Physical Findings: AIMS: Facial and Oral Movements Muscles of Facial Expression: None, normal Lips and Perioral Area: None, normal Jaw: None, normal Tongue: None, normal,Extremity Movements Upper (arms, wrists, hands, fingers): None, normal Lower (legs, knees, ankles, toes): None, normal, Trunk Movements Neck, shoulders, hips: None, normal, Overall Severity Severity of abnormal movements (highest score from questions above): None, normal Incapacitation due to abnormal movements: None, normal Patient's awareness of abnormal movements (rate only patient's report): No Awareness, Dental Status Current problems with teeth  and/or dentures?: No Does patient usually wear dentures?: No  CIWA:  CIWA-Ar Total: 2 COWS:  COWS Total Score: 0  Musculoskeletal: Strength & Muscle Tone: decreased Gait & Station: shuffle Patient leans: N/A  Psychiatric Specialty Exam: Physical Exam  Nursing note and vitals reviewed. Constitutional: He is oriented to person, place, and time. He appears well-developed and well-nourished.  HENT:  Head: Normocephalic.  Respiratory: Effort normal.  Neurological: He is alert and oriented to person, place, and time.    ROS  Blood pressure 127/69, pulse (!) 56, temperature 98.2 F (36.8 C), temperature source Oral, resp. rate 18, height 5\' 9"  (1.753 m), weight 129.7 kg, SpO2 100 %.Body mass index is 42.23 kg/m.  General Appearance:  Disheveled  Eye Contact:  Fair  Speech:  Normal Rate  Volume:  Decreased  Mood:  Depressed  Affect:  Congruent  Thought Process:  Coherent and Descriptions of Associations: Circumstantial  Orientation:  Full (Time, Place, and Person)  Thought Content:  Logical  Suicidal Thoughts:  No  Homicidal Thoughts:  No  Memory:  Immediate;   Fair Recent;   Fair Remote;   Fair  Judgement:  Impaired  Insight:  Fair  Psychomotor Activity:  Decreased and Psychomotor Retardation  Concentration:  Concentration: Fair and Attention Span: Fair  Recall:  [employer] of Knowledge:  Fair  Language:  Good  Akathisia:  Negative  Handed:  Right  AIMS (if indicated):     Assets:  Desire for Improvement Resilience  ADL's:  Intact  Cognition:  WNL  Sleep:  Number of Hours: 4     Treatment Plan Summary: Daily contact with patient to assess and evaluate symptoms and progress in treatment, Medication management and Plan : Patient is seen and examined.  Patient is a 68 year old male with the above-stated past psychiatric history who is seen in follow-up.   Diagnosis: #1 major depression, recurrent, severe without psychotic features, #2 insomnia, #3 generalized anxiety disorder, #4 reported history of diabetes mellitus type 2  Patient is seen in follow-up.  He is essentially unchanged from yesterday.  I will increase his citalopram to 20 mg p.o. daily.  We are still awaiting a physical therapy consult.  He has refused to allow [place] to check his blood sugars.  His old records suggested a history of diabetes, and we will hold off on medications at least at this point.  He is sleeping poorly, and I will add trazodone 50 mg p.o. nightly as needed insomnia.  Housing for him may be a significant problem the discharge.  No other changes in his medications. 1.  Increase citalopram to 20 mg p.o. daily for depression and anxiety. 2.  Encourage patient to allow checking his blood sugars given his history. 3.  Continue  Glucophage at thousand milligrams p.o. twice daily with food for diabetes mellitus type 2. 4.  Add trazodone 50 mg p.o. nightly as needed insomnia. 5.  Disposition planning-in progress.  Sharma Covert, Griffin 02/09/2019, 10:24 AM

## 2019-02-09 NOTE — Progress Notes (Signed)
PT Cancellation Note  Patient Details Name: Bradley Griffin MRN: [id number] DOB: 11/10/1950   Cancelled Treatment:    Reason Eval/Treat Not Completed: Other (comment)(PT consult acknowleged. Per chart review, pt able to AMB limited community distances with unilateral UE loading of 10-15lbs without report of falls. RN note 9/11 reports AMB with "steady gait." Per notes this date, pt continues to isolate and is conditionally participatory with his medical care. Holding PT evaluation at this time with plans to attempt again at later date/time. Will continue to follow remotely and evaluate once patient is more participatory. PT recommending daily ambulation for this patient either ad lib or with nursing staff as needed to prevent deconditioning.   1:32 PM, 02/09/19 Etta Grandchild, PT, DPT Physical Therapist - [address] [facility]  [phone] ([place])    Bradley Griffin 02/09/2019, 1:31 PM

## 2019-02-09 NOTE — Progress Notes (Signed)
Patient this morning has refused to comply with CBG testing on nightshift and for this writer this morning. Pt. States, "I'm not diabetic, so no". Will notify attending during treatment team meeting this morning.

## 2019-02-09 NOTE — Progress Notes (Signed)
Patient not agreeable to CBG check. Provider is aware of this refusal.

## 2019-02-09 NOTE — Plan of Care (Signed)
  Problem: Self-Concept: Goal: Will verbalize positive feelings about self Outcome: Not Progressing  Patient is guarded not willing to vebalize feelings to staff, isolates to self.

## 2019-02-09 NOTE — Evaluation (Signed)
Physical Therapy Evaluation Patient Details Name: Bradley Griffin MRN: [id number] DOB: 08/10/1950 Today's Date: 02/09/2019   History of Present Illness  Bradley Griffin is a 54yoM who comes to [facility] [place] with a [id number] free-weight soliciting strangers for assitance with self-drowning. Pt was seen by our services while admitted in April 2020 after 3w progressive weakness and a fall at home. PTA pt lives alone, used to drive, reports he has no family or friends, that he was formerly independent with ADL/IADL but no longer has a car. Per chart review it appears he may soon be evicted from his apartment.  Clinical Impression  Pt admitted with above diagnosis. Pt currently with functional limitations due to the deficits listed below (see "PT Problem List"). Nursing helps Bradley Griffin locate patient on BHU as he is AMB the unit independently and in the day room upon arrival. Pt agreeable to PT evaluation. The pt is alert and oriented x4, pleasant, minimally conversational, and generally a good historian. Pt responds adequately to questioning but offers minimal additional conversation. Pt denies any acute abnormality with mobility and denies pain. Pt reports 1 fall "4 weeks ago" in which he was unable to rise from the floor. CHL shows another fall related to an admission in April 2020. Provider notes this admission say pt is not an accurate historian. Pt AMB hall with author, noted slow shuffle gait with minimal trunk sway, no instability of gait, no gross asymmetry. 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 Pt does not feel that his neuropathy affects his balance. Functional mobility assessment demonstrates baseline capability and independence. Pt lacks logistical resources to care for self from an IADL capacity, and lacks problem solving needs to address these assistance needs when discussed with author. Pt has not acute rehabilitative need, however is not  safe to return to home alone. Pt will need assistance with IADL and intermittent supervision. No additional PT services needed at this time. PT signing off. Recommend pt continue to mobilize ad lib or with help from staff to prevent decline from inactivity.     Follow Up Recommendations Supervision - Intermittent;Outpatient PT;Other (comment)(OPPT could do more detailed assessment of balance and floor-to-standing ability if pt desires.)    Equipment Recommendations  None recommended by PT    Recommendations for Other Services       Precautions / Restrictions Precautions Precautions: Fall Precaution Comments: high falls risk patient in the system, however score available and pt being permitted to AMB freely about BHU without supervision or device with RN described gait as 'steady' Restrictions Weight Bearing Restrictions: No      Mobility  Bed Mobility               General bed mobility comments: received in day room  Transfers Overall transfer level: Independent Equipment used: None                Ambulation/Gait Ambulation/Gait assistance: Modified independent (Device/Increase time) Gait Distance (Feet): 125 Feet Assistive device: None Gait Pattern/deviations: Shuffle;Step-to pattern;Decreased step length - right;Decreased step length - left   Gait velocity interpretation: <1.8 ft/sec, indicate of risk for recurrent falls General Gait Details: generally stable trunk with minimal sway  Stairs            Wheelchair Mobility    Modified Rankin (Stroke Patients Only)       Balance Overall balance assessment: History of Falls;No apparent balance deficits (not formally assessed)(reports 1  fall several weeks ago, unable to rise from floor which is consistent with his baseline.)                                           Pertinent Vitals/Pain Pain Assessment: No/denies pain    Home Living Family/patient expects to be discharged to::  Private residence Living Arrangements: Alone Available Help at Discharge: (reports to have no family or friends) Type of Home: Apartment Home Access: Level entry     Home Layout: One level Home Equipment: Cane - single point;Walker - 2 wheels(does not use DME for mobility at baseline)      Prior Function Level of Independence: Independent         Comments: Pt reports that he is independent with ADLs/IADLs. He reports that he was previously driving and able to do all his own grocery shopping/meal preparation, but he no longer has a car.     Hand Dominance   Dominant Hand: Left    Extremity/Trunk Assessment   Upper Extremity Assessment Upper Extremity Assessment: Overall WFL for tasks assessed    Lower Extremity Assessment Lower Extremity Assessment: Overall WFL for tasks assessed    Cervical / Trunk Assessment Cervical / Trunk Assessment: Normal  Communication   Communication: No difficulties  Cognition Arousal/Alertness: Awake/alert Behavior During Therapy: WFL for tasks assessed/performed(interactive calm, makes jokes occasionally.) Overall Cognitive Status: No family/caregiver present to determine baseline cognitive functioning                                        General Comments      Exercises     Assessment/Plan    PT Assessment Patent does not need any further PT services;All further PT needs can be met in the next venue of care  PT Problem List Decreased strength;Decreased activity tolerance       PT Treatment Interventions      PT Goals (Current goals can be found in the Care Plan section)  Acute Rehab PT Goals PT Goal Formulation: All assessment and education complete, DC therapy    Frequency     Barriers to discharge        Co-evaluation               AM-PAC PT "6 Clicks" Mobility  Outcome Measure Help needed turning from your back to your side while in a flat bed without using bedrails?: None Help needed  moving from lying on your back to sitting on the side of a flat bed without using bedrails?: None Help needed moving to and from a bed to a chair (including a wheelchair)?: A Little Help needed standing up from a chair using your arms (e.g., wheelchair or bedside chair)?: A Little Help needed to walk in hospital room?: None Help needed climbing 3-5 steps with a railing? : A Little 6 Click Score: 21    End of Session   Activity Tolerance: Patient tolerated treatment well;No increased pain Patient left: in bed Nurse Communication: Mobility status PT Visit Diagnosis: Difficulty in walking, not elsewhere classified (R26.2);Other abnormalities of gait and mobility (R26.89)    Time: 9833-8250 PT Time Calculation (min) (ACUTE ONLY): 11 min   Charges:   PT Evaluation $PT Eval Low Complexity: 1 Low  4:52 PM, 02/09/19 Etta Grandchild, PT, DPT Physical Therapist - [facility]  [phone] ([place])    Bradley Griffin 02/09/2019, 4:45 PM

## 2019-02-09 NOTE — Plan of Care (Signed)
  Problem: Education: Goal: Ability to make informed decisions regarding treatment will improve Outcome: Progressing   Problem: Coping: Goal: Coping ability will improve Outcome: Progressing  D: Patient continues to refuse his CBG. States that he does not need it that his blood sugar is fine. Denies SI, but patient is unable to convincingly contract for safety. Patient has showered and changed clothes. Body odor is much improved although patient still appears disheveled and hygiene is still poor. Site where melanoma was removed is scabbed over on the right side of patient's head. Patient denies pain. Voices no complaints. Mood is sad. Affect is appropriate to circumstance. A: Continue to monitor for safety. R: Safety maintained.

## 2019-02-09 NOTE — BHH Suicide Risk Assessment (Signed)
[facility] INPATIENT:  Family/Significant Other Suicide Prevention Education  Suicide Prevention Education:  Patient Refusal for Family/Significant Other Suicide Prevention Education: The patient Bradley Griffin has refused to provide written consent for family/significant other to be provided Family/Significant Other Suicide Prevention Education during admission and/or prior to discharge.  Physician notified.  [place] MSW LCSW 02/09/2019, 9:33 AM

## 2019-02-09 NOTE — BHH Group Notes (Signed)
LCSW Group Therapy Note  02/09/2019 10:00 AM   Type of Therapy and Topic: Group Therapy: Feelings Around Returning Home & Establishing a Supportive Framework and Supporting Oneself When Supports Not Available   Participation Level:  Did Not Attend   Description of Group:  Patients first processed thoughts and feelings about upcoming discharge. These included fears of upcoming changes, lack of change, new living environments, judgements and expectations from others and overall stigma of mental health issues. The group then discussed the definition of a supportive framework, what that looks and feels like, and how do to discern it from an unhealthy non-supportive network. The group identified different types of supports as well as what to do when your family/friends are less than helpful or unavailable   Therapeutic Goals  1. Patient will identify one healthy supportive network that they can use at discharge. 2. Patient will identify one factor of a supportive framework and how to tell it from an unhealthy network. 3. Patient able to identify one coping skill to use when they do not have positive supports from others. 4. Patient will demonstrate ability to communicate their needs through discussion and/or role plays.   Summary of Patient Progress:  x    Therapeutic Modalities Cognitive Behavioral Therapy Motivational Interviewing    Evalina Field, MSW, LCSW Clinical Social Work 02/09/2019 10:00 AM

## 2019-02-10 DIAGNOSIS — E119 Type 2 diabetes mellitus without complications: Secondary | ICD-10-CM

## 2019-02-10 DIAGNOSIS — F3181 Bipolar II disorder: Secondary | ICD-10-CM

## 2019-02-10 DIAGNOSIS — F603 Borderline personality disorder: Secondary | ICD-10-CM

## 2019-02-10 DIAGNOSIS — C4491 Basal cell carcinoma of skin, unspecified: Secondary | ICD-10-CM

## 2019-02-10 LAB — HEMOGLOBIN A1C
Hgb A1c MFr Bld: 7.3 % — ABNORMAL HIGH (ref 4.8–5.6)
Mean Plasma Glucose: 163 mg/dL

## 2019-02-10 LAB — GLUCOSE, CAPILLARY: Glucose-Capillary: 149 mg/dL — ABNORMAL HIGH (ref 70–99)

## 2019-02-10 MED ORDER — LURASIDONE HCL 40 MG PO TABS
40.0000 mg | ORAL_TABLET | Freq: Every day | ORAL | Status: DC
  Administered 2019-02-11 – 2019-02-14 (×4): 40 mg via ORAL
  Filled 2019-02-10 (×4): qty 1

## 2019-02-10 MED ORDER — LURASIDONE HCL 40 MG PO TABS: 40.0000 mg | ORAL_TABLET | Freq: Every day | ORAL | Status: DC

## 2019-02-10 NOTE — Progress Notes (Signed)
Recreation Therapy Notes  INPATIENT RECREATION THERAPY ASSESSMENT  Patient Details Name: Bradley Griffin MRN: [id number] DOB: 1951-01-27 Today's Date: 02/10/2019       Information Obtained From: Patient  Able to Participate in Assessment/Interview: Yes  Patient Presentation: Responsive  Reason for Admission (Per Patient): Active Symptoms, Suicidal Ideation, Suicide Attempt  Patient Stressors:    Coping Skills:   (Nothing)  Leisure Interests (2+):  Psychiatric nurse, Exercise - Walking, Individual - Reading  Frequency of Recreation/Participation: Monthly  Awareness of Community Resources:     [employer]:     Current Use:    If no, Barriers?:    Expressed Interest in [place] of Residence:  Insurance underwriter  Patient Main Form of Transportation: Musician  Patient Strengths:  Kind, Sharing  Patient Identified Areas of Improvement:  Its too late for improvement  Patient Goal for Hospitalization:  None  Current SI (including self-harm):  No  Current HI:  No  Current AVH: No  Staff Intervention Plan: Group Attendance, Collaborate with Interdisciplinary Treatment Team  Consent to Intern Participation: N/A  Stacie Templin 02/10/2019, 1:00 PM

## 2019-02-10 NOTE — Progress Notes (Signed)
[facility] MD Progress Note  02/10/2019 5:32 PM Bradley Griffin  MRN:  [id number] Subjective: Patient seen chart reviewed.  This is a 68 year old man with a history of chronic mental health problems who was brought to the hospital after making something of a dramatic show of trying to drown himself in public.  Patient tells me about how first of all he was trying to kill himself by jumping into the leg carrying a weight but then at the end of the interview reverses himself and says that he really was not trying to kill himself.  Patient has a long list of grievances which certainly do sound like they add up to some pretty overwhelming stress.  He says he no longer has any money because he was living off of his inheritance from his mother which he has now gone completely through.  He has no insurance no doctors no way to take care of himself anymore.  Declares himself having no friends and no family.  He has not been on psychiatric medicine or receiving any outpatient mental health treatment recently.  Denies alcohol or drug abuse.  We went back through his old chart and reviewed some of his previous diagnoses and treatment which was somewhat more illustrative.  Additionally the patient has been found to have persistently elevated blood sugars and probably has developed type 2 diabetes without his knowing it.  Additionally he has a pretty large carcinoma growing on the skin of his right temple about which she has done nothing. Principal Problem: Bipolar 2 disorder (HCC) Diagnosis: Principal Problem:   Bipolar 2 disorder (HCC) Active Problems:   Major depressive disorder, recurrent severe without psychotic features ([place])   Borderline personality disorder ([place])   Basal cell carcinoma   Diabetes ([place])  Total Time spent with patient: 30 minutes  Past Psychiatric History: Although at first he tends to minimize it going through his old records shows that he does have a history of chronic mental health problems.   For years he was followed by [facility] Tim[facility].  It looks like he carried a diagnosis of bipolar disorder for a long time.  I see one episode in which Dr. Mamie Nick had diagnosed him with schizophrenia because of a catatonic-like presentation that he had.  Patient had been on multiple medicines in the past but apparently Latuda was thought to be particularly helpful as well as Wellbutrin.  He says he has had past suicide attempts but is been since he was 68 years old.  Past Medical History:  Past Medical History:  Diagnosis Date  . Blindness of left eye   . Cancer [facility])    Skin cancer     Past Surgical History:  Procedure Laterality Date  . CHOLECYSTECTOMY     Family History: History reviewed. No pertinent family history. Family Psychiatric  History: Denies any Social History:  Social History   Substance and Sexual Activity  Alcohol Use Never  . Frequency: Never     Social History   Substance and Sexual Activity  Drug Use Never    Social History   Socioeconomic History  . Marital status: Single    Spouse name: Not on file  . Number of children: Not on file  . Years of education: Not on file  . Highest education level: Not on file  Occupational History  . Not on file  Social Needs  . Financial resource strain: Not on file  . Food insecurity    Worry: Not on file  Inability: Not on file  . Transportation needs    Medical: Not on file    Non-medical: Not on file  Tobacco Use  . Smoking status: Never Smoker  . Smokeless tobacco: Never Used  Substance and Sexual Activity  . Alcohol use: Never    Frequency: Never  . Drug use: Never  . Sexual activity: Not Currently  Lifestyle  . Physical activity    Days per week: Not on file    Minutes per session: Not on file  . Stress: Not on file  Relationships  . Social Herbalist on phone: Not on file    Gets together: Not on file    Attends religious service: Not on file    Active member of club or organization:  Not on file    Attends meetings of clubs or organizations: Not on file    Relationship status: Not on file  Other Topics Concern  . Not on file  Social History Narrative  . Not on file   Additional Social History:                         Sleep: Fair  Appetite:  Fair  Current Medications: Current Facility-Administered Medications  Medication Dose Route Frequency Provider Last Rate Last Dose  . acetaminophen (TYLENOL) tablet 650 mg  650 mg Oral Q6H PRN Patrecia Pour, NP      . alum & mag hydroxide-simeth (MAALOX/MYLANTA) 200-200-20 MG/5ML suspension 30 mL  30 mL Oral Q4H PRN Patrecia Pour, NP      . citalopram (CELEXA) tablet 20 mg  20 mg Oral Daily Sharma Covert, MD   20 mg at 02/10/19 0815  . influenza vaccine adjuvanted (FLUAD) injection 0.5 mL  0.5 mL Intramuscular Tomorrow-1000 Clapacs, John T, MD      . insulin aspart (novoLOG) injection 0-15 Units  0-15 Units Subcutaneous TID WC Clary, Cordie Grice, MD      . insulin aspart (novoLOG) injection 0-5 Units  0-5 Units Subcutaneous QHS Sharma Covert, MD      . Derrill Memo ON 02/11/2019] lurasidone (LATUDA) tablet 40 mg  40 mg Oral Q breakfast Clapacs, John T, MD      . magnesium hydroxide (MILK OF MAGNESIA) suspension 30 mL  30 mL Oral Daily PRN Patrecia Pour, NP      . metFORMIN (GLUCOPHAGE) tablet 1,000 mg  1,000 mg Oral BID WC Patrecia Pour, NP   1,000 mg at 02/10/19 1620  . pneumococcal 23 valent vaccine (PNU-IMMUNE) injection 0.5 mL  0.5 mL Intramuscular Tomorrow-1000 Clapacs, John T, MD      . traZODone (DESYREL) tablet 50 mg  50 mg Oral QHS PRN Sharma Covert, MD        Lab Results:  Results for orders placed or performed during the hospital encounter of 02/08/19 (from the past 48 hour(s))  Glucose, capillary     Status: Abnormal   Collection Time: 02/09/19 12:45 PM  Result Value Ref Range   Glucose-Capillary 149 (H) 70 - 99 mg/dL    Blood Alcohol level:  No results found for: [facility]  Metabolic  Disorder Labs: Lab Results  Component Value Date   HGBA1C 7.3 (H) 02/07/2019   MPG 163 02/07/2019   MPG 260.39 09/19/2018   No results found for: PROLACTIN No results found for: CHOL, TRIG, HDL, CHOLHDL, VLDL, LDLCALC  Physical Findings: AIMS: Facial and Oral Movements Muscles of Facial Expression: None,  normal Lips and Perioral Area: None, normal Jaw: None, normal Tongue: None, normal,Extremity Movements Upper (arms, wrists, hands, fingers): None, normal Lower (legs, knees, ankles, toes): None, normal, Trunk Movements Neck, shoulders, hips: None, normal, Overall Severity Severity of abnormal movements (highest score from questions above): None, normal Incapacitation due to abnormal movements: None, normal Patient's awareness of abnormal movements (rate only patient's report): No Awareness, Dental Status Current problems with teeth and/or dentures?: No Does patient usually wear dentures?: No  CIWA:  CIWA-Ar Total: 2 COWS:  COWS Total Score: 0  Musculoskeletal: Strength & Muscle Tone: within normal limits Gait & Station: normal Patient leans: N/A  Psychiatric Specialty Exam: Physical Exam  Nursing note and vitals reviewed. Constitutional: He appears well-developed and well-nourished.  HENT:  Head: Normocephalic and atraumatic.  Eyes: Pupils are equal, round, and reactive to light. Conjunctivae are normal.  Neck: Normal range of motion.  Cardiovascular: Regular rhythm and normal heart sounds.  Respiratory: Effort normal.  GI: Soft.  Musculoskeletal: Normal range of motion.  Neurological: He is alert.  Skin: Skin is warm and dry.  Psychiatric: His mood appears anxious. His affect is labile. His speech is tangential. He is not agitated and not aggressive. Cognition and memory are impaired. He expresses impulsivity and inappropriate judgment. He exhibits a depressed mood. He expresses suicidal ideation. He expresses suicidal plans.    Review of Systems  Constitutional:  Negative.   HENT: Negative.   Eyes: Negative.   Respiratory: Negative.   Cardiovascular: Negative.   Gastrointestinal: Negative.   Musculoskeletal: Negative.   Skin: Negative.   Neurological: Negative.   Psychiatric/Behavioral: Positive for depression and suicidal ideas. Negative for hallucinations, memory loss and substance abuse. The patient is nervous/anxious and has insomnia.     Blood pressure 136/73, pulse (!) 56, temperature (!) 97.5 F (36.4 C), temperature source Oral, resp. rate 17, height 5\' 9"  (1.753 m), weight 129.7 kg, SpO2 98 %.Body mass index is 42.23 kg/m.  General Appearance: Disheveled and Malodorous and poorly groomed  Eye Contact:  Fair  Speech:  Clear and Coherent  Volume:  Normal  Mood:  Dysphoric  Affect:  Inappropriate and Labile  Thought Process:  Coherent  Orientation:  Full (Time, Place, and Person)  Thought Content:  Illogical and Tangential  Suicidal Thoughts:  Yes.  with intent/plan  Homicidal Thoughts:  No  Memory:  Immediate;   Fair Recent;   Fair Remote;   Poor  Judgement:  Impaired  Insight:  Shallow  Psychomotor Activity:  Decreased  Concentration:  Concentration: Poor  Recall:  Poor  Fund of Knowledge:  Fair  Language:  Fair  Akathisia:  No  Handed:  Right  AIMS (if indicated):     Assets:  Resilience  ADL's:  Impaired  Cognition:  Impaired,  Mild  Sleep:  Number of Hours: 6.75     Treatment Plan Summary: Daily contact with patient to assess and evaluate symptoms and progress in treatment, Medication management and Plan This is a 68 year old man who came into the hospital after a dramatic suicide attempt.  He is goofy and his affect and demeanor.  Fairly histrionic.  Ambivalent about whether or not he wants treatment.  Gamy about medication.  Patient certainly does represent a risk for suicide.  If he is telling the truth about his social situation it is pretty [place] outside of the hospital.  I suggested to him that if Anette Guarneri was  helpful in the past for his illness perhaps we should try it again.  He eventually relented.  Continue the citalopram for now.  He is on Metformin for the elevated blood sugars.  I do not think there is anything we can do about his skin cancer right now here in the hospital he is quite aware of how serious it is.  Alethia Berthold, MD 02/10/2019, 5:32 PM

## 2019-02-10 NOTE — Progress Notes (Signed)
Recreation Therapy Notes   Date: 02/10/2019  Time: 9:30 am   Location: Craft room   Behavioral response: N/A   Intervention Topic: Necessities   Discussion/Intervention: Patient did not attend group.   Clinical Observations/Feedback:  Patient did not attend group.   Bettey Muraoka LRT/CTRS        Sir Mallis 02/10/2019 11:04 AM

## 2019-02-10 NOTE — Plan of Care (Signed)
Patient rated his depression 1/10 and anxiety 0/10.Patient stayed in his room and minimal interactions with staff & peers.Patient refused let staff check his blood sugar states in a strong voice "I am not diabetic."Denies SI,HI and AVH.Patient stated that if the old blood on the tight side of his head get clean it starts bleeding.Encouraged patient for shower.Did not attend groups.Appetite good.Support and encouragement given.

## 2019-02-10 NOTE — BHH Group Notes (Signed)

## 2019-02-11 DIAGNOSIS — F3181 Bipolar II disorder: Principal | ICD-10-CM

## 2019-02-11 NOTE — Plan of Care (Signed)
  Problem: Education: Goal: Ability to make informed decisions regarding treatment will improve Outcome: Progressing  Patient to make informed decisions regarding treatment plan.

## 2019-02-11 NOTE — Progress Notes (Signed)
D: Patient stated slept poor last night .Stated appetite good and energy level  normal. Stated concentration  good . Stated on Depression scale 0, hopeless 2 and anxiety 0 .( low 0-10 high) Denies suicidal  homicidal ideations  . No auditory hallucinations  No pain concerns . Appropriate ADL'S. Interacting with peers and staff. Patient remains unable to make sound decisions  encourage  to continue to work on  coping   decision making  and positive  feeling of self in therapy group. Staff will continue to help patient look at recources at discharge. Patient non compliant  with  diabetic  issues refused to allow finger stick , but compliant with remainder  of medication and knowledgeable  . Voice no concerns around  wake , sleep cycle .   A: Encourage patient participation with unit programming . Instruction  Given on  Medication , verbalize understanding.  R: Voice no other concerns. Staff continue to monitor

## 2019-02-11 NOTE — Progress Notes (Signed)
[facility] MD Progress Note  02/11/2019 5:37 PM Bradley Griffin  MRN:  [id number] Subjective: Follow-up for this patient with depression and bipolar disorder and recent suicidal threats.  Patient seen chart reviewed.  Patient spends most of his time in his room.  Hygiene is poor.  Same close as yesterday still with his shirt inside out.  Patient denies any suicidal ideation today but continues to express hopelessness about his life situation.  Has no idea what he is going to do in the future.  Not sleeping particularly well.  Appetite not so good.  He is refusing to consider insulin but is willing to take oral medicine for diabetes.  Blood sugars running in the mid 100s. Principal Problem: Bipolar 2 disorder (HCC) Diagnosis: Principal Problem:   Bipolar 2 disorder (HCC) Active Problems:   Major depressive disorder, recurrent severe without psychotic features ([place])   Borderline personality disorder ([place])   Basal cell carcinoma   Diabetes ([place])  Total Time spent with patient: 30 minutes  Past Psychiatric History: Past history of mood instability mood episodes previous psychiatric treatment but recent noncompliance  Past Medical History:  Past Medical History:  Diagnosis Date  . Blindness of left eye   . Cancer [facility])    Skin cancer     Past Surgical History:  Procedure Laterality Date  . CHOLECYSTECTOMY     Family History: History reviewed. No pertinent family history. Family Psychiatric  History: See previous Social History:  Social History   Substance and Sexual Activity  Alcohol Use Never  . Frequency: Never     Social History   Substance and Sexual Activity  Drug Use Never    Social History   Socioeconomic History  . Marital status: Single    Spouse name: Not on file  . Number of children: Not on file  . Years of education: Not on file  . Highest education level: Not on file  Occupational History  . Not on file  Social Needs  . Financial resource strain: Not on file  .  Food insecurity    Worry: Not on file    Inability: Not on file  . Transportation needs    Medical: Not on file    Non-medical: Not on file  Tobacco Use  . Smoking status: Never Smoker  . Smokeless tobacco: Never Used  Substance and Sexual Activity  . Alcohol use: Never    Frequency: Never  . Drug use: Never  . Sexual activity: Not Currently  Lifestyle  . Physical activity    Days per week: Not on file    Minutes per session: Not on file  . Stress: Not on file  Relationships  . Social Herbalist on phone: Not on file    Gets together: Not on file    Attends religious service: Not on file    Active member of club or organization: Not on file    Attends meetings of clubs or organizations: Not on file    Relationship status: Not on file  Other Topics Concern  . Not on file  Social History Narrative  . Not on file   Additional Social History:                         Sleep: Fair  Appetite:  Fair  Current Medications: Current Facility-Administered Medications  Medication Dose Route Frequency Provider Last Rate Last Dose  . acetaminophen (TYLENOL) tablet 650 mg  650 mg  Oral Q6H PRN Patrecia Pour, NP      . alum & mag hydroxide-simeth (MAALOX/MYLANTA) 200-200-20 MG/5ML suspension 30 mL  30 mL Oral Q4H PRN Patrecia Pour, NP      . citalopram (CELEXA) tablet 20 mg  20 mg Oral Daily Sharma Covert, MD   20 mg at 02/11/19 0825  . influenza vaccine adjuvanted (FLUAD) injection 0.5 mL  0.5 mL Intramuscular Tomorrow-1000 Clapacs, John T, MD      . insulin aspart (novoLOG) injection 0-15 Units  0-15 Units Subcutaneous TID WC Clary, Cordie Grice, MD      . insulin aspart (novoLOG) injection 0-5 Units  0-5 Units Subcutaneous QHS Sharma Covert, MD      . lurasidone (LATUDA) tablet 40 mg  40 mg Oral Q breakfast Clapacs, Madie Reno, MD   40 mg at 02/11/19 1250  . magnesium hydroxide (MILK OF MAGNESIA) suspension 30 mL  30 mL Oral Daily PRN Patrecia Pour, NP       . metFORMIN (GLUCOPHAGE) tablet 1,000 mg  1,000 mg Oral BID WC Patrecia Pour, NP   1,000 mg at 02/11/19 1708  . pneumococcal 23 valent vaccine (PNU-IMMUNE) injection 0.5 mL  0.5 mL Intramuscular Tomorrow-1000 Clapacs, John T, MD      . traZODone (DESYREL) tablet 50 mg  50 mg Oral QHS PRN Sharma Covert, MD        Lab Results: No results found for this or any previous visit (from the past 48 hour(s)).  Blood Alcohol level:  No results found for: [facility]  Metabolic Disorder Labs: Lab Results  Component Value Date   HGBA1C 7.3 (H) 02/07/2019   MPG 163 02/07/2019   MPG 260.39 09/19/2018   No results found for: PROLACTIN No results found for: CHOL, TRIG, HDL, CHOLHDL, VLDL, LDLCALC  Physical Findings: AIMS: Facial and Oral Movements Muscles of Facial Expression: None, normal Lips and Perioral Area: None, normal Jaw: None, normal Tongue: None, normal,Extremity Movements Upper (arms, wrists, hands, fingers): None, normal Lower (legs, knees, ankles, toes): None, normal, Trunk Movements Neck, shoulders, hips: None, normal, Overall Severity Severity of abnormal movements (highest score from questions above): None, normal Incapacitation due to abnormal movements: None, normal Patient's awareness of abnormal movements (rate only patient's report): No Awareness, Dental Status Current problems with teeth and/or dentures?: No Does patient usually wear dentures?: No  CIWA:  CIWA-Ar Total: 2 COWS:  COWS Total Score: 0  Musculoskeletal: Strength & Muscle Tone: within normal limits Gait & Station: normal Patient leans: N/A  Psychiatric Specialty Exam: Physical Exam  Nursing note and vitals reviewed. Constitutional: He appears well-developed and well-nourished.  HENT:  Head: Normocephalic and atraumatic.  Eyes: Pupils are equal, round, and reactive to light. Conjunctivae are normal.  Neck: Normal range of motion.  Cardiovascular: Regular rhythm and normal heart sounds.   Respiratory: Effort normal. No respiratory distress.  GI: Soft.  Musculoskeletal: Normal range of motion.  Neurological: He is alert.  Skin: Skin is warm and dry.  Psychiatric: His affect is blunt. His speech is delayed. He is slowed. Cognition and memory are impaired. He expresses impulsivity. He expresses no homicidal and no suicidal ideation.    Review of Systems  Constitutional: Negative.   HENT: Negative.   Eyes: Negative.   Respiratory: Negative.   Cardiovascular: Negative.   Gastrointestinal: Negative.   Musculoskeletal: Negative.   Skin: Negative.   Neurological: Negative.   Psychiatric/Behavioral: Positive for depression. Negative for hallucinations, memory loss, substance abuse  and suicidal ideas. The patient is nervous/anxious. The patient does not have insomnia.     Blood pressure 138/71, pulse (!) 55, temperature 98 F (36.7 C), temperature source Oral, resp. rate 16, height 5\' 9"  (1.753 m), weight 129.7 kg, SpO2 99 %.Body mass index is 42.23 kg/m.  General Appearance: Disheveled  Eye Contact:  Fair  Speech:  Slow  Volume:  Decreased  Mood:  Dysphoric  Affect:  Congruent  Thought Process:  Goal Directed  Orientation:  Full (Time, Place, and Person)  Thought Content:  Rumination and Tangential  Suicidal Thoughts:  No  Homicidal Thoughts:  No  Memory:  Immediate;   Fair Recent;   Poor Remote;   Poor  Judgement:  Impaired  Insight:  Shallow  Psychomotor Activity:  Decreased  Concentration:  Concentration: Poor  Recall:  Poor  Fund of Knowledge:  Poor  Language:  Fair  Akathisia:  No  Handed:  Right  AIMS (if indicated):     Assets:  Desire for Improvement  ADL's:  Impaired  Cognition:  Impaired,  Mild  Sleep:  Number of Hours: 7.75     Treatment Plan Summary: Daily contact with patient to assess and evaluate symptoms and progress in treatment, Medication management and Plan Patient remains very passive about future plans.  Part of this is depression.   Continue antidepressant and medicine for bipolar depression.  Part of this may be just passive at he and personality.  Encourage patient to be up out of his room attending groups and interacting.  Continue to monitor blood sugars.  We will see if we can find any kind of resources that would allow him a place to live in the future.  Alethia Berthold, MD 02/11/2019, 5:37 PM

## 2019-02-11 NOTE — Progress Notes (Signed)
Patient alert and oriented x 4, affect is blunted and irritable he denies SI/HI/AVH, isolates to self not interacting with peers and staff, he appears angry and sad, he was argumentative with staff, he was not receptive, his thoughts are organized and coherent.  Patient refused for staff to checks his blood glucose, 15 minutes safety checks maintained will continue to monitor

## 2019-02-11 NOTE — BHH Group Notes (Signed)

## 2019-02-11 NOTE — Progress Notes (Signed)
Recreation Therapy Notes   Date: 02/11/2019  Time: 9:30 am   Location: Craft room   Behavioral response: N/A   Intervention Topic: Self-Care  Discussion/Intervention: Patient did not attend group.   Clinical Observations/Feedback:  Patient did not attend group.   Jadien Lehigh LRT/CTRS        Ashly Goethe 02/11/2019 10:30 AM

## 2019-02-11 NOTE — Plan of Care (Signed)
Patient remains unable to make sound decisions  encourage  to continue to work on  coping   decision making  and positive  feeling of self in therapy group. Staff will continue to help patient look at recources at discharge. Patient non compliant  with  diabetic  issues refused to allow finger stick , but compliant with remainder  of medication and knowledgeable  . Voice no concerns around  wake , sleep cycle .  Problem: Self-Concept: Goal: Ability to identify factors that promote anxiety will improve Outcome: Progressing Goal: Level of anxiety will decrease Outcome: Not Progressing Goal: Ability to modify response to factors that promote anxiety will improve Outcome: Not Progressing   Problem: Coping: Goal: Ability to identify and develop effective coping behavior will improve Outcome: Not Progressing   Problem: Education: Goal: Ability to state activities that reduce stress will improve Outcome: Not Progressing   Problem: Self-Concept: Goal: Will verbalize positive feelings about self Outcome: Not Progressing   Problem: Health Behavior/Discharge Planning: Goal: Identification of resources available to assist in meeting health care needs will improve Outcome: Not Progressing   Problem: Coping: Goal: Communication of feelings regarding changes in body function or appearance will improve Outcome: Not Progressing   Problem: Education: Goal: Ability to identify and develop effective coping behavior will improve Outcome: Progressing   Problem: Self-Concept: Goal: Will verbalize positive feelings about self Outcome: Progressing Goal: Level of anxiety will decrease Outcome: Progressing   Problem: Safety: Goal: Ability to disclose and discuss suicidal ideas will improve Outcome: Progressing Goal: Ability to identify and utilize support systems that promote safety will improve Outcome: Progressing   Problem: Role Relationship: Goal: Will demonstrate positive changes in social  behaviors and relationships Outcome: Progressing   Problem: Health Behavior/Discharge Planning: Goal: Ability to make decisions will improve Outcome: Progressing Goal: Compliance with therapeutic regimen will improve Outcome: Progressing   Problem: Coping: Goal: Coping ability will improve Outcome: Progressing Goal: Will verbalize feelings Outcome: Progressing   Problem: Activity: Goal: Interest or engagement in leisure activities will improve 02/11/2019 1748 by Leodis Liverpool, RN Outcome: Not Progressing 02/11/2019 1747 by Leodis Liverpool, RN Outcome: Progressing Goal: Imbalance in normal sleep/wake cycle will improve Outcome: Progressing   Problem: Education: Goal: Utilization of techniques to improve thought processes will improve 02/11/2019 1748 by Leodis Liverpool, RN Outcome: Progressing 02/11/2019 1747 by Leodis Liverpool, RN Outcome: Progressing Goal: Knowledge of the prescribed therapeutic regimen will improve Outcome: Progressing   Problem: Health Behavior/Discharge Planning: Goal: Identification of resources available to assist in meeting health care needs will improve 02/11/2019 1748 by Leodis Liverpool, RN Outcome: Not Progressing 02/11/2019 1747 by Leodis Liverpool, RN Outcome: Not Progressing   Patient  denies suicidal ideations

## 2019-02-12 NOTE — Progress Notes (Signed)
D: Patient stated slept poor last night .Stated appetite fair  and energy level  lowl. Stated concentration  good . Stated on Depression scale 1 , hopeless 3 and anxiety 0 .( low 0-10 high) Denies suicidal  homicidal ideations  .  No auditory hallucinations  No pain concerns . Appropriate ADL'S. Interacting with peers and staff. Voice no concerns around  wake , sleep cycle . Patient remains unable to make sound decisions  encourage  to continue to work on  coping   decision making  and positive  feeling of self in therapy group. Staff will continue to help patient look at recources at discharge.  Patient  Continue to be  non compliant  with  diabetic  issues refused to allow finger stick , but compliant with remainder  of medication and knowledgeable  .   noted out of room more this  Shift , more interaction noted  A: Encourage patient participation with unit programming . Instruction  Given on  Medication , verbalize understanding.  R: Voice no other concerns. Staff continue to monitor

## 2019-02-12 NOTE — Plan of Care (Signed)
Patient is alert and stable , but refusing care , had to be reminded to do ADLs, sleeping good and eating good , patient is aware of positive coping skills , patient is not able to voice his concerns , patient denies any Si/HI/AVH affect is blunts and speech is delayed, patient is encouraged and supported , minimal socialization with peers , education is provided to engage in activity to promote mental and emotional state, 15 minute check is maintained no distress.   Problem: Education: Goal: Ability to make informed decisions regarding treatment will improve 02/12/2019 0409 by Clemens Catholic, RN Outcome: Progressing 02/12/2019 0402 by Clemens Catholic, RN Outcome: Progressing   Problem: Coping: Goal: Coping ability will improve 02/12/2019 0409 by Clemens Catholic, RN Outcome: Progressing 02/12/2019 0402 by Clemens Catholic, RN Outcome: Progressing   Problem: Health Behavior/Discharge Planning: Goal: Identification of resources available to assist in meeting health care needs will improve 02/12/2019 0409 by Clemens Catholic, RN Outcome: Progressing 02/12/2019 0402 by Clemens Catholic, RN Outcome: Progressing   Problem: Medication: Goal: Compliance with prescribed medication regimen will improve 02/12/2019 0409 by Clemens Catholic, RN Outcome: Progressing 02/12/2019 0402 by Clemens Catholic, RN Outcome: Progressing   Problem: Self-Concept: Goal: Ability to disclose and discuss suicidal ideas will improve 02/12/2019 0409 by Clemens Catholic, RN Outcome: Progressing 02/12/2019 0402 by Clemens Catholic, RN Outcome: Progressing Goal: Will verbalize positive feelings about self 02/12/2019 0409 by Clemens Catholic, RN Outcome: Progressing 02/12/2019 0402 by Clemens Catholic, RN Outcome: Progressing   Problem: Education: Goal: Utilization of techniques to improve thought processes will improve 02/12/2019 0409 by Clemens Catholic, RN Outcome: Progressing 02/12/2019  0402 by Clemens Catholic, RN Outcome: Progressing Goal: Knowledge of the prescribed therapeutic regimen will improve 02/12/2019 0409 by Clemens Catholic, RN Outcome: Progressing 02/12/2019 0402 by Clemens Catholic, RN Outcome: Progressing   Problem: Activity: Goal: Interest or engagement in leisure activities will improve 02/12/2019 0409 by Clemens Catholic, RN Outcome: Progressing 02/12/2019 0402 by Clemens Catholic, RN Outcome: Progressing Goal: Imbalance in normal sleep/wake cycle will improve 02/12/2019 0409 by Clemens Catholic, RN Outcome: Progressing 02/12/2019 0402 by Clemens Catholic, RN Outcome: Progressing   Problem: Coping: Goal: Coping ability will improve 02/12/2019 0409 by Clemens Catholic, RN Outcome: Progressing 02/12/2019 0402 by Clemens Catholic, RN Outcome: Progressing Goal: Will verbalize feelings 02/12/2019 0409 by Clemens Catholic, RN Outcome: Progressing 02/12/2019 0402 by Clemens Catholic, RN Outcome: Progressing   Problem: Health Behavior/Discharge Planning: Goal: Ability to make decisions will improve 02/12/2019 0409 by Clemens Catholic, RN Outcome: Progressing 02/12/2019 0402 by Clemens Catholic, RN Outcome: Progressing Goal: Compliance with therapeutic regimen will improve 02/12/2019 0409 by Clemens Catholic, RN Outcome: Progressing 02/12/2019 0402 by Clemens Catholic, RN Outcome: Progressing   Problem: Role Relationship: Goal: Will demonstrate positive changes in social behaviors and relationships 02/12/2019 0409 by Clemens Catholic, RN Outcome: Progressing 02/12/2019 0402 by Clemens Catholic, RN Outcome: Progressing   Problem: Safety: Goal: Ability to disclose and discuss suicidal ideas will improve 02/12/2019 0409 by Clemens Catholic, RN Outcome: Progressing 02/12/2019 0402 by Clemens Catholic, RN Outcome: Progressing Goal: Ability to identify and utilize support systems that promote safety will  improve 02/12/2019 0409 by Clemens Catholic, RN Outcome: Progressing 02/12/2019 0402 by Clemens Catholic, RN Outcome: Progressing   Problem: Self-Concept: Goal: Will verbalize positive feelings about self 02/12/2019 0409 by Clemens Catholic, RN Outcome: Progressing 02/12/2019 0402 by Clemens Catholic, RN Outcome: Progressing Goal: Level of anxiety will decrease 02/12/2019 0409 by Clemens Catholic, RN Outcome: Progressing 02/12/2019 0402 by Clemens Catholic, RN Outcome: Progressing  Problem: Education: Goal: Ability to identify and develop effective coping behavior will improve 02/12/2019 0409 by Clemens Catholic, RN Outcome: Progressing 02/12/2019 0402 by Clemens Catholic, RN Outcome: Progressing   Problem: Coping: Goal: Communication of feelings regarding changes in body function or appearance will improve 02/12/2019 0409 by Clemens Catholic, RN Outcome: Progressing 02/12/2019 0402 by Clemens Catholic, RN Outcome: Progressing   Problem: Health Behavior/Discharge Planning: Goal: Identification of resources available to assist in meeting health care needs will improve 02/12/2019 0409 by Clemens Catholic, RN Outcome: Progressing 02/12/2019 0402 by Clemens Catholic, RN Outcome: Progressing   Problem: Self-Concept: Goal: Expressions of positive opinion of body image will increase 02/12/2019 0409 by Clemens Catholic, RN Outcome: Progressing 02/12/2019 0402 by Clemens Catholic, RN Outcome: Progressing Goal: Will verbalize positive feelings about self 02/12/2019 0409 by Clemens Catholic, RN Outcome: Progressing 02/12/2019 0402 by Clemens Catholic, RN Outcome: Progressing   Problem: Education: Goal: Ability to state activities that reduce stress will improve 02/12/2019 0409 by Clemens Catholic, RN Outcome: Progressing 02/12/2019 0402 by Clemens Catholic, RN Outcome: Progressing   Problem: Coping: Goal: Ability to identify and develop effective  coping behavior will improve 02/12/2019 0409 by Clemens Catholic, RN Outcome: Progressing 02/12/2019 0402 by Clemens Catholic, RN Outcome: Progressing   Problem: Self-Concept: Goal: Ability to identify factors that promote anxiety will improve 02/12/2019 0409 by Clemens Catholic, RN Outcome: Progressing 02/12/2019 0402 by Clemens Catholic, RN Outcome: Progressing Goal: Level of anxiety will decrease 02/12/2019 0409 by Clemens Catholic, RN Outcome: Progressing 02/12/2019 0402 by Clemens Catholic, RN Outcome: Progressing Goal: Ability to modify response to factors that promote anxiety will improve 02/12/2019 0409 by Clemens Catholic, RN Outcome: Progressing 02/12/2019 0402 by Clemens Catholic, RN Outcome: Progressing

## 2019-02-12 NOTE — Progress Notes (Signed)
Recreation Therapy Notes   Date: 02/12/2019  Time: 9:30 am  Location: Craft room  Behavioral response: Not Engaged   Intervention Topic: Goals   Discussion/Intervention:  Group content on today was focused on goals. Patients described what goals are and how they define goals. Individuals expressed how they go about setting goals and reaching them. The group identified how important goals are and if they make short term goals to reach long term goals. Patients described how many goals they work on at a time and what affects them not reaching their goal. Individuals described how much time they put into planning and obtaining their goals. The group participated in the intervention "My Goal Board" and made personal goal boards to help them achieve their goal.  Clinical Observations/Feedback:  Patient came to group and did not provide any contributions toward the group discussion. He was not engaged in the intervention and refused to participate. Individual left group due to unknown reasons and never returned.   Siegfried Vieth LRT/CTRS          Lyfe Reihl 02/12/2019 11:47 AM

## 2019-02-12 NOTE — Plan of Care (Signed)
Voice no concerns around  wake , sleep cycle . Patient remains unable to make sound decisions  encourage  to continue to work on  coping   decision making  and positive  feeling of self in therapy group. Staff will continue to help patient look at recources at discharge. Patient non compliant  with  diabetic  issues refused to allow finger stick , but compliant with remainder  of medication and knowledgeable  .   Problem: Education: Goal: Ability to make informed decisions regarding treatment will improve Outcome: Progressing   Problem: Coping: Goal: Coping ability will improve Outcome: Progressing   Problem: Health Behavior/Discharge Planning: Goal: Identification of resources available to assist in meeting health care needs will improve Outcome: Progressing   Problem: Medication: Goal: Compliance with prescribed medication regimen will improve Outcome: Progressing   Problem: Self-Concept: Goal: Ability to disclose and discuss suicidal ideas will improve Outcome: Progressing Goal: Will verbalize positive feelings about self Outcome: Progressing   Problem: Activity: Goal: Imbalance in normal sleep/wake cycle will improve Outcome: Progressing   Problem: Coping: Goal: Coping ability will improve Outcome: Progressing Goal: Will verbalize feelings Outcome: Progressing   Problem: Safety: Goal: Ability to disclose and discuss suicidal ideas will improve Outcome: Progressing Goal: Ability to identify and utilize support systems that promote safety will improve Outcome: Progressing   Problem: Self-Concept: Goal: Level of anxiety will decrease Outcome: Progressing   Problem: Coping: Goal: Ability to identify and develop effective coping behavior will improve Outcome: Progressing

## 2019-02-12 NOTE — Progress Notes (Signed)
[facility] MD Progress Note  02/12/2019 2:59 PM Bradley Griffin  MRN:  [id number] Subjective: Follow-up for this gentleman with chronic mood instability and anxiety.  Patient reports he is still feeling pretty bad most of the time.  Feels tired and rundown.  Negative and hopeless about his future.  Also complaining of frequent urination.  I explained to him that that was a common symptom of diabetes and probably means his blood sugar is running high.  He has been refusing fingerstick blood glucose checks and I am hoping we can convince him to cooperate.  He is taking his oral medicine.  Eats adequately.  Goes to some groups but for the most part is staying pretty isolated.  Denies acute suicidal intent or plan.  Does not appear to be psychotic but still appears to be sad and down and withdrawn.  Still a great deal of limitation in his resources. Principal Problem: Bipolar 2 disorder ([place]) Diagnosis: Principal Problem:   Bipolar 2 disorder ([place]) Active Problems:   Major depressive disorder, recurrent severe without psychotic features ([place])   Borderline personality disorder ([place])   Basal cell carcinoma   Diabetes ([place])  Total Time spent with patient: 30 minutes  Past Psychiatric History: Patient has a history of recurrent depression and history of a diagnosis of bipolar and depression in the past.  Recent suicidal ideation.  Has not been compliant with any recent recommended treatment.  Past Medical History:  Past Medical History:  Diagnosis Date  . Blindness of left eye   . Cancer [facility])    Skin cancer     Past Surgical History:  Procedure Laterality Date  . CHOLECYSTECTOMY     Family History: History reviewed. No pertinent family history. Family Psychiatric  History: None reported Social History:  Social History   Substance and Sexual Activity  Alcohol Use Never  . Frequency: Never     Social History   Substance and Sexual Activity  Drug Use Never    Social History   Socioeconomic  History  . Marital status: Single    Spouse name: Not on file  . Number of children: Not on file  . Years of education: Not on file  . Highest education level: Not on file  Occupational History  . Not on file  Social Needs  . Financial resource strain: Not on file  . Food insecurity    Worry: Not on file    Inability: Not on file  . Transportation needs    Medical: Not on file    Non-medical: Not on file  Tobacco Use  . Smoking status: Never Smoker  . Smokeless tobacco: Never Used  Substance and Sexual Activity  . Alcohol use: Never    Frequency: Never  . Drug use: Never  . Sexual activity: Not Currently  Lifestyle  . Physical activity    Days per week: Not on file    Minutes per session: Not on file  . Stress: Not on file  Relationships  . Social Herbalist on phone: Not on file    Gets together: Not on file    Attends religious service: Not on file    Active member of club or organization: Not on file    Attends meetings of clubs or organizations: Not on file    Relationship status: Not on file  Other Topics Concern  . Not on file  Social History Narrative  . Not on file   Additional Social History:  Sleep: Fair  Appetite:  Fair  Current Medications: Current Facility-Administered Medications  Medication Dose Route Frequency Provider Last Rate Last Dose  . acetaminophen (TYLENOL) tablet 650 mg  650 mg Oral Q6H PRN Patrecia Pour, NP      . alum & mag hydroxide-simeth (MAALOX/MYLANTA) 200-200-20 MG/5ML suspension 30 mL  30 mL Oral Q4H PRN Patrecia Pour, NP      . citalopram (CELEXA) tablet 20 mg  20 mg Oral Daily Sharma Covert, MD   20 mg at 02/12/19 0756  . influenza vaccine adjuvanted (FLUAD) injection 0.5 mL  0.5 mL Intramuscular Tomorrow-1000 Lauren Modisette T, MD      . lurasidone (LATUDA) tablet 40 mg  40 mg Oral Q breakfast Kimberlyann Hollar, Madie Reno, MD   40 mg at 02/12/19 0756  . magnesium hydroxide (MILK OF  MAGNESIA) suspension 30 mL  30 mL Oral Daily PRN Patrecia Pour, NP      . metFORMIN (GLUCOPHAGE) tablet 1,000 mg  1,000 mg Oral BID WC Patrecia Pour, NP   1,000 mg at 02/12/19 0756  . pneumococcal 23 valent vaccine (PNU-IMMUNE) injection 0.5 mL  0.5 mL Intramuscular Tomorrow-1000 Katalyn Matin T, MD      . traZODone (DESYREL) tablet 50 mg  50 mg Oral QHS PRN Sharma Covert, MD        Lab Results: No results found for this or any previous visit (from the past 48 hour(s)).  Blood Alcohol level:  No results found for: [facility]  Metabolic Disorder Labs: Lab Results  Component Value Date   HGBA1C 7.3 (H) 02/07/2019   MPG 163 02/07/2019   MPG 260.39 09/19/2018   No results found for: PROLACTIN No results found for: CHOL, TRIG, HDL, CHOLHDL, VLDL, LDLCALC  Physical Findings: AIMS: Facial and Oral Movements Muscles of Facial Expression: None, normal Lips and Perioral Area: None, normal Jaw: None, normal Tongue: None, normal,Extremity Movements Upper (arms, wrists, hands, fingers): None, normal Lower (legs, knees, ankles, toes): None, normal, Trunk Movements Neck, shoulders, hips: None, normal, Overall Severity Severity of abnormal movements (highest score from questions above): None, normal Incapacitation due to abnormal movements: None, normal Patient's awareness of abnormal movements (rate only patient's report): No Awareness, Dental Status Current problems with teeth and/or dentures?: No Does patient usually wear dentures?: No  CIWA:  CIWA-Ar Total: 2 COWS:  COWS Total Score: 0  Musculoskeletal: Strength & Muscle Tone: decreased Gait & Station: unsteady Patient leans: N/A  Psychiatric Specialty Exam: Physical Exam  Nursing note and vitals reviewed. Constitutional: He appears well-developed and well-nourished.  HENT:  Head: Normocephalic and atraumatic.  Eyes: Pupils are equal, round, and reactive to light. Conjunctivae are normal.  Neck: Normal range of motion.   Cardiovascular: Normal heart sounds.  Respiratory: Effort normal.  GI: Soft.  Musculoskeletal: Normal range of motion.  Neurological: He is alert.  Skin: Skin is warm and dry.     Psychiatric: His affect is blunt. His speech is delayed. He is slowed and withdrawn. Cognition and memory are impaired. He expresses inappropriate judgment. He expresses no suicidal ideation.    Review of Systems  Constitutional: Positive for malaise/fatigue.  HENT: Negative.   Eyes: Negative.   Respiratory: Negative.   Cardiovascular: Negative.   Gastrointestinal: Negative.   Musculoskeletal: Negative.   Skin: Negative.   Neurological: Negative.   Psychiatric/Behavioral: Positive for depression. Negative for hallucinations, memory loss, substance abuse and suicidal ideas. The patient is nervous/anxious and has insomnia.     Blood  pressure 138/71, pulse (!) 55, temperature 98 F (36.7 C), temperature source Oral, resp. rate 16, height 5\' 9"  (1.753 m), weight 129.7 kg, SpO2 99 %.Body mass index is 42.23 kg/m.  General Appearance: Disheveled  Eye Contact:  Fair  Speech:  Slow  Volume:  Decreased  Mood:  Depressed and Dysphoric  Affect:  Constricted  Thought Process:  Coherent  Orientation:  Full (Time, Place, and Person)  Thought Content:  Logical, Rumination and Tangential  Suicidal Thoughts:  Yes.  without intent/plan  Homicidal Thoughts:  No  Memory:  Immediate;   Fair Recent;   Fair Remote;   Fair  Judgement:  Fair  Insight:  Shallow  Psychomotor Activity:  Decreased  Concentration:  Concentration: Fair  Recall:  [employer] of Knowledge:  Fair  Language:  Fair  Akathisia:  No  Handed:  Right  AIMS (if indicated):     Assets:  Resilience  ADL's:  Impaired  Cognition:  WNL  Sleep:  Number of Hours: 7.5     Treatment Plan Summary: Daily contact with patient to assess and evaluate symptoms and progress in treatment, Medication management and Plan Patient with multiple medical problems  and history of chronic depression.  Severe lack of supportive resources limits what we can do for him.  Patient is on medicine for depression tolerating that.  Remains however very blunted withdrawn negative and passive.  Trying to encourage him to get up out of bed attend groups.  Trying to form some rapport with him by validating the difficulty of his situation.  We will still keep working on what we might be able to do as far as discharge and follow-up planning.  I am going to go ahead and see if I can get someone to take a look at the lesion on his forehead to at least get an opinion about whether anything needs to be done urgently about it.  I have also tried to educate the patient about the importance of letting his blood sugars to be checked even if he will not accept insulin.  Alethia Berthold, MD 02/12/2019, 2:59 PM

## 2019-02-12 NOTE — Plan of Care (Signed)
Currently in the dayroom. Alert and oriented. Pleasant on approach. Thought process organized. Denying thoughts of self harm. Denying hallucinations. Pleasant upon approach. Patient interacts with peers appropriately. Currently no sign of distress. Staff continue to provide support and encouragements.

## 2019-02-12 NOTE — [facility] Group Notes (Signed)
LCSW Group Therapy Note  02/12/2019 11:33 AM  Type of Therapy/Topic:  Group Therapy:  Emotion Regulation  Participation Level:  Did Not Attend   Description of Group:   The purpose of this group is to assist patients in learning to regulate negative emotions and experience positive emotions. Patients will be guided to discuss ways in which they have been vulnerable to their negative emotions. These vulnerabilities will be juxtaposed with experiences of positive emotions or situations, and patients will be challenged to use positive emotions to combat negative ones. Special emphasis will be placed on coping with negative emotions in conflict situations, and patients will process healthy conflict resolution skills.  Therapeutic Goals: 1. Patient will identify two positive emotions or experiences to reflect on in order to balance out negative emotions 2. Patient will label two or more emotions that they find the most difficult to experience 3. Patient will demonstrate positive conflict resolution skills through discussion and/or role plays  Summary of Patient Progress: x   Therapeutic Modalities:   Cognitive Behavioral Therapy Feelings Identification Dialectical Behavioral Therapy   Evalina Field, MSW, LCSW Clinical Social Work 02/12/2019 11:33 AM

## 2019-02-13 NOTE — Progress Notes (Signed)
Patient  Stayed in the dayroom with peers until bedtime. Pleasant and compliant with treatment. However, patient continues to refuse CBG check. Patient refuses to perform hygiene as well: Disheveled with body odor. Patient is otherwise eating and sleeping well. Staff continue to provide support and encouragements.

## 2019-02-13 NOTE — Plan of Care (Signed)
Patient is pleasant and cooperative on approach.Patient stated that his depression is getting better.Denies SI,HI and AVH.Patient is visible in the milieu and interacting  with peers.Attended groups.Compliant with medications.Patient had shower today.Appetite and energy level good.Support and encouragement given.

## 2019-02-13 NOTE — Progress Notes (Signed)
[facility] MD Progress Note  02/13/2019 5:22 PM Bradley Griffin  MRN:  [id number] Subjective: Follow-up for this patient with bipolar disorder.  He tells me that today was a better day.  He is not feeling as depressed or hopeless.  He got up and took a shower and has washed his clothing.  He is interacting with others appropriately.  He has still been refusing blood sugars but today was open to my suggestion that he allow [place] to do it so we can treat his diabetes a little better.  Denies any acute suicidal ideation but still is very hopeless about finding any outpatient plan for himself Principal Problem: Bipolar 2 disorder ([place]) Diagnosis: Principal Problem:   Bipolar 2 disorder ([place]) Active Problems:   Major depressive disorder, recurrent severe without psychotic features ([place])   Borderline personality disorder ([place])   Basal cell carcinoma   Diabetes ([place])  Total Time spent with patient: 30 minutes  Past Psychiatric History: Patient has a past history of mood instability possible bipolar disorder  Past Medical History:  Past Medical History:  Diagnosis Date  . Blindness of left eye   . Cancer [facility])    Skin cancer     Past Surgical History:  Procedure Laterality Date  . CHOLECYSTECTOMY     Family History: History reviewed. No pertinent family history. Family Psychiatric  History: See previous Social History:  Social History   Substance and Sexual Activity  Alcohol Use Never  . Frequency: Never     Social History   Substance and Sexual Activity  Drug Use Never    Social History   Socioeconomic History  . Marital status: Single    Spouse name: Not on file  . Number of children: Not on file  . Years of education: Not on file  . Highest education level: Not on file  Occupational History  . Not on file  Social Needs  . Financial resource strain: Not on file  . Food insecurity    Worry: Not on file    Inability: Not on file  . Transportation needs    Medical: Not on file     Non-medical: Not on file  Tobacco Use  . Smoking status: Never Smoker  . Smokeless tobacco: Never Used  Substance and Sexual Activity  . Alcohol use: Never    Frequency: Never  . Drug use: Never  . Sexual activity: Not Currently  Lifestyle  . Physical activity    Days per week: Not on file    Minutes per session: Not on file  . Stress: Not on file  Relationships  . Social Herbalist on phone: Not on file    Gets together: Not on file    Attends religious service: Not on file    Active member of club or organization: Not on file    Attends meetings of clubs or organizations: Not on file    Relationship status: Not on file  Other Topics Concern  . Not on file  Social History Narrative  . Not on file   Additional Social History:                         Sleep: Fair  Appetite:  Fair  Current Medications: Current Facility-Administered Medications  Medication Dose Route Frequency Provider Last Rate Last Dose  . acetaminophen (TYLENOL) tablet 650 mg  650 mg Oral Q6H PRN Patrecia Pour, NP      .  alum & mag hydroxide-simeth (MAALOX/MYLANTA) 200-200-20 MG/5ML suspension 30 mL  30 mL Oral Q4H PRN Patrecia Pour, NP      . citalopram (CELEXA) tablet 20 mg  20 mg Oral Daily Sharma Covert, MD   20 mg at 02/13/19 [id number]  . influenza vaccine adjuvanted (FLUAD) injection 0.5 mL  0.5 mL Intramuscular Tomorrow-1000 Clapacs, John T, MD      . lurasidone (LATUDA) tablet 40 mg  40 mg Oral Q breakfast Clapacs, Madie Reno, MD   40 mg at 02/13/19 [id number]  . magnesium hydroxide (MILK OF MAGNESIA) suspension 30 mL  30 mL Oral Daily PRN Patrecia Pour, NP      . metFORMIN (GLUCOPHAGE) tablet 1,000 mg  1,000 mg Oral BID WC Patrecia Pour, NP   1,000 mg at 02/13/19 1654  . pneumococcal 23 valent vaccine (PNU-IMMUNE) injection 0.5 mL  0.5 mL Intramuscular Tomorrow-1000 Clapacs, John T, MD      . traZODone (DESYREL) tablet 50 mg  50 mg Oral QHS PRN Sharma Covert, MD         Lab Results: No results found for this or any previous visit (from the past 48 hour(s)).  Blood Alcohol level:  No results found for: [facility]  Metabolic Disorder Labs: Lab Results  Component Value Date   HGBA1C 7.3 (H) 02/07/2019   MPG 163 02/07/2019   MPG 260.39 09/19/2018   No results found for: PROLACTIN No results found for: CHOL, TRIG, HDL, CHOLHDL, VLDL, LDLCALC  Physical Findings: AIMS: Facial and Oral Movements Muscles of Facial Expression: None, normal Lips and Perioral Area: None, normal Jaw: None, normal Tongue: None, normal,Extremity Movements Upper (arms, wrists, hands, fingers): None, normal Lower (legs, knees, ankles, toes): None, normal, Trunk Movements Neck, shoulders, hips: None, normal, Overall Severity Severity of abnormal movements (highest score from questions above): None, normal Incapacitation due to abnormal movements: None, normal Patient's awareness of abnormal movements (rate only patient's report): No Awareness, Dental Status Current problems with teeth and/or dentures?: No Does patient usually wear dentures?: No  CIWA:  CIWA-Ar Total: 2 COWS:  COWS Total Score: 0  Musculoskeletal: Strength & Muscle Tone: within normal limits Gait & Station: normal Patient leans: N/A  Psychiatric Specialty Exam: Physical Exam  Nursing note and vitals reviewed. Constitutional: He appears well-developed and well-nourished.  HENT:  Head: Normocephalic and atraumatic.  Eyes: Pupils are equal, round, and reactive to light. Conjunctivae are normal.  Neck: Normal range of motion.  Cardiovascular: Regular rhythm and normal heart sounds.  Respiratory: Effort normal.  GI: Soft.  Musculoskeletal: Normal range of motion.  Neurological: He is alert.  Skin: Skin is warm and dry.  Psychiatric: He has a normal mood and affect. His speech is normal and behavior is normal. Judgment and thought content normal. Cognition and memory are normal.    Review of Systems   Constitutional: Negative.   HENT: Negative.   Eyes: Negative.   Respiratory: Negative.   Cardiovascular: Negative.   Gastrointestinal: Negative.   Musculoskeletal: Negative.   Skin: Negative.   Neurological: Negative.   Psychiatric/Behavioral: Positive for depression. Negative for hallucinations, memory loss, substance abuse and suicidal ideas. The patient is nervous/anxious. The patient does not have insomnia.     Blood pressure 95/66, pulse (!) 58, temperature 97.9 F (36.6 C), temperature source Oral, resp. rate 17, height 5\' 9"  (1.753 m), weight 129.7 kg, SpO2 98 %.Body mass index is 42.23 kg/m.  General Appearance: Casual  Eye Contact:  Fair  Speech:  Normal Rate  Volume:  Normal  Mood:  Euthymic  Affect:  Constricted  Thought Process:  Goal Directed  Orientation:  Full (Time, Place, and Person)  Thought Content:  Logical  Suicidal Thoughts:  No  Homicidal Thoughts:  No  Memory:  Immediate;   Fair Recent;   Fair Remote;   Fair  Judgement:  Fair  Insight:  Shallow  Psychomotor Activity:  Decreased  Concentration:  Concentration: Fair  Recall:  [employer] of Knowledge:  Fair  Language:  Fair  Akathisia:  No  Handed:  Right  AIMS (if indicated):     Assets:  Desire for Improvement  ADL's:  Impaired  Cognition:  Impaired,  Mild  Sleep:  Number of Hours: 8     Treatment Plan Summary: Daily contact with patient to assess and evaluate symptoms and progress in treatment, Medication management and Plan Patient with mood instability depression and hopelessness is feeling a little better today.  Still no clear discharge plan available.  Patient would not farewell at the homeless shelter because of his medical problems.  He is agreeing now to allow his blood sugars to be checked.  Continue current medicine no change to current antidepressant doses.  Alethia Berthold, MD 02/13/2019, 5:22 PM

## 2019-02-13 NOTE — Progress Notes (Signed)
Recreation Therapy Notes   Date: 02/13/2019  Time: 9:30 am  Location: Craft room  Behavioral response: Appropriate   Intervention Topic: Problem Solving  Discussion/Intervention:  Group content on today was focused on problem solving. The group described what problem solving is. Patients expressed how problems affect them and how they deal with problems. Individuals identified healthy ways to deal with problems. Patients explained what normally happens to them when they do not deal with problems. The group expressed reoccurring problems for them. The group participated in the intervention "Ways to Solve problems" where patients were given a chance to explore different ways to solve problems.   Clinical Observations/Feedback:  Patient came to group and was focused on what peers and staff had to say about problem solving. Individual was social with peers and staff while participating in the intervention.   Bradley Griffin LRT/CTRS         [name] 02/13/2019 11:15 AM

## 2019-02-13 NOTE — Tx Team (Signed)
Interdisciplinary Treatment and Diagnostic Plan Update  02/13/2019 Time of Session: 8:30am  Bradley Griffin MRN: [id number]  Principal Diagnosis: Bipolar 2 disorder ([place])  Secondary Diagnoses: Principal Problem:   Bipolar 2 disorder ([place]) Active Problems:   Major depressive disorder, recurrent severe without psychotic features ([place])   Borderline personality disorder ([place])   Basal cell carcinoma   Diabetes ([place])   Current Medications:  Current Facility-Administered Medications  Medication Dose Route Frequency Provider Last Rate Last Dose  . acetaminophen (TYLENOL) tablet 650 mg  650 mg Oral Q6H PRN Patrecia Pour, NP      . alum & mag hydroxide-simeth (MAALOX/MYLANTA) 200-200-20 MG/5ML suspension 30 mL  30 mL Oral Q4H PRN Patrecia Pour, NP      . citalopram (CELEXA) tablet 20 mg  20 mg Oral Daily Sharma Covert, MD   20 mg at 02/13/19 0539  . influenza vaccine adjuvanted (FLUAD) injection 0.5 mL  0.5 mL Intramuscular Tomorrow-1000 Clapacs, John T, MD      . lurasidone (LATUDA) tablet 40 mg  40 mg Oral Q breakfast Clapacs, Madie Reno, MD   40 mg at 02/13/19 7673  . magnesium hydroxide (MILK OF MAGNESIA) suspension 30 mL  30 mL Oral Daily PRN Patrecia Pour, NP      . metFORMIN (GLUCOPHAGE) tablet 1,000 mg  1,000 mg Oral BID WC Patrecia Pour, NP   1,000 mg at 02/13/19 4193  . pneumococcal 23 valent vaccine (PNU-IMMUNE) injection 0.5 mL  0.5 mL Intramuscular Tomorrow-1000 Clapacs, John T, MD      . traZODone (DESYREL) tablet 50 mg  50 mg Oral QHS PRN Sharma Covert, MD        PTA Medications: No medications prior to admission.    Patient Stressors: Financial difficulties Health problems Medication change or noncompliance Occupational concerns  Patient Strengths: Average or above average intelligence Capable of independent living Communication skills Motivation for treatment/growth  Treatment Modalities: Medication Management, Group therapy, Case management,  1 to  1 session with clinician, Psychoeducation, Recreational therapy.   Physician Treatment Plan for Primary Diagnosis: Bipolar 2 disorder ([place]) Long Term Goal(s): Improvement in symptoms so as ready for discharge  Short Term Goals: Ability to identify changes in lifestyle to reduce recurrence of condition will improve Ability to verbalize feelings will improve Ability to disclose and discuss suicidal ideas Ability to demonstrate self-control will improve Ability to identify and develop effective coping behaviors will improve Ability to maintain clinical measurements within normal limits will improve Ability to identify changes in lifestyle to reduce recurrence of condition will improve Ability to verbalize feelings will improve Ability to disclose and discuss suicidal ideas Ability to demonstrate self-control will improve Ability to identify and develop effective coping behaviors will improve Ability to maintain clinical measurements within normal limits will improve  Medication Management: Evaluate patient's response, side effects, and tolerance of medication regimen.  Therapeutic Interventions: 1 to 1 sessions, Unit Group sessions and Medication administration.  Evaluation of Outcomes: Not Met  Physician Treatment Plan for Secondary Diagnosis: Principal Problem:   Bipolar 2 disorder ([place]) Active Problems:   Major depressive disorder, recurrent severe without psychotic features ([place])   Borderline personality disorder ([place])   Basal cell carcinoma   Diabetes ([place])   Long Term Goal(s): Improvement in symptoms so as ready for discharge  Short Term Goals: Ability to identify changes in lifestyle to reduce recurrence of condition will improve Ability to verbalize feelings will improve Ability to disclose and discuss suicidal  ideas Ability to demonstrate self-control will improve Ability to identify and develop effective coping behaviors will improve Ability to maintain clinical  measurements within normal limits will improve Ability to identify changes in lifestyle to reduce recurrence of condition will improve Ability to verbalize feelings will improve Ability to disclose and discuss suicidal ideas Ability to demonstrate self-control will improve Ability to identify and develop effective coping behaviors will improve Ability to maintain clinical measurements within normal limits will improve  Medication Management: Evaluate patient's response, side effects, and tolerance of medication regimen.  Therapeutic Interventions: 1 to 1 sessions, Unit Group sessions and Medication administration.  Evaluation of Outcomes: Not Met   RN Treatment Plan for Primary Diagnosis: Bipolar 2 disorder (HCC) Long Term Goal(s): Knowledge of disease and therapeutic regimen to maintain health will improve  Short Term Goals: Ability to disclose and discuss suicidal ideas and Ability to identify and develop effective coping behaviors will improve  Medication Management: RN will administer medications as ordered by provider, will assess and evaluate patient's response and provide education to patient for prescribed medication. RN will report any adverse and/or side effects to prescribing provider.  Therapeutic Interventions: 1 on 1 counseling sessions, Psychoeducation, Medication administration, Evaluate responses to treatment, Monitor vital signs and CBGs as ordered, Perform/monitor CIWA, COWS, AIMS and Fall Risk screenings as ordered, Perform wound care treatments as ordered.  Evaluation of Outcomes: Not Met   LCSW Treatment Plan for Primary Diagnosis: Bipolar 2 disorder (HCC) Long Term Goal(s): Safe transition to appropriate next level of care at discharge, Engage patient in therapeutic group addressing interpersonal concerns.  Short Term Goals: Engage patient in aftercare planning with referrals and resources, Increase social support and Increase skills for wellness and  recovery  Therapeutic Interventions: Assess for all discharge needs, 1 to 1 time with Social worker, Explore available resources and support systems, Assess for adequacy in community support network, Educate family and significant other(s) on suicide prevention, Complete Psychosocial Assessment, Interpersonal group therapy.  Evaluation of Outcomes: Progressing  Pt states he will return home, but based on medical, mental health presentation, Dr Clary feels he needs ALF placement   Progress in Treatment: Attending groups: No Participating in groups: No Taking medication as prescribed: Yes Toleration medication: Yes, no side effects reported at this time Family/Significant other contact made: No Patient understands diagnosis: No  Limited insight Discussing patient identified problems/goals with staff: Yes Medical problems stabilized or resolved: Yes Denies suicidal/homicidal ideation: Yes Issues/concerns per patient self-inventory: None Other: N/A  New problem(s) identified: None identified at this time.   New Short Term/Long Term Goal(s): "I have no goals.  Where am I going after here?  I guess I will go back to my apartment.".   Discharge Plan or Barriers: SPE pamphlet, Mobile Crisis information, and AA/NA information provided to patient for additional community support and resources. Pt continues to decline follow up treatment at this time.  Reason for Continuation of Hospitalization:  Medication stabilization Suicidal ideation   Estimated Length of Stay: TBD Attendees: Patient: 02/13/2019  10:49 AM  Physician: Dr Clapacs MD 02/13/2019  10:49 AM  Nursing:   02/13/2019  10:49 AM  Other:    Social Worker:   LCSW 02/13/2019  10:49 AM  Recreational Therapist:  02/13/2019  10:49 AM          Scribe for Treatment Team:   , MSW, LCSW Clinical Social Work 02/13/2019 10:50 AM    

## 2019-02-13 NOTE — [facility] Group Notes (Signed)
Balance In Life 02/13/2019 1PM  Type of Therapy/Topic:  Group Therapy:  Balance in Life  Participation Level:  None  Description of Group:   This group will address the concept of balance and how it feels and looks when one is unbalanced. Patients will be encouraged to process areas in their lives that are out of balance and identify reasons for remaining unbalanced. Facilitators will guide patients in utilizing problem-solving interventions to address and correct the stressor making their life unbalanced. Understanding and applying boundaries will be explored and addressed for obtaining and maintaining a balanced life. Patients will be encouraged to explore ways to assertively make their unbalanced needs known to significant others in their lives, using other group members and facilitator for support and feedback.  Therapeutic Goals: 1. Patient will identify two or more emotions or situations they have that consume much of in their lives. 2. Patient will identify signs/triggers that life has become out of balance:  3. Patient will identify two ways to set boundaries in order to achieve balance in their lives:  4. Patient will demonstrate ability to communicate their needs through discussion and/or role plays  Summary of Patient Progress: Pt attended group but no input provided. Pt sat quietly.   Therapeutic Modalities:   Cognitive Behavioral Therapy Solution-Focused Therapy Assertiveness Training  Jenefer Woerner Lynelle Smoke, LCSW

## 2019-02-14 LAB — GLUCOSE, CAPILLARY: Glucose-Capillary: 146 mg/dL — ABNORMAL HIGH (ref 70–99)

## 2019-02-14 NOTE — Progress Notes (Signed)
Patient is alert and oriented x 4, affect is flat but he brightens upon approach, his thoughts are organized and coherent, he was visible in the milieu interacting appropriately with peers and staff, no distress noted, 15 minutes safety checks maintained will continue to monitor.

## 2019-02-14 NOTE — Progress Notes (Signed)
Patient is alert and oriented x 4 stable and responding well to treatment regimen, socializing appropriately with peers , has no physical complains , adjusting well in the unit and eating well and well hydrated with fluids and juices, sleep is adequate , patient denies any suicidal or homicidal ideations and signs of hallucinations or delusions at this time , education and support is provided no distress.

## 2019-02-14 NOTE — Plan of Care (Signed)
Patient is sleeping well with out distress, only requiring 15 minutes safety checks    Problem: Education: Goal: Ability to make informed decisions regarding treatment will improve Outcome: Progressing   Problem: Coping: Goal: Coping ability will improve Outcome: Progressing   Problem: Health Behavior/Discharge Planning: Goal: Identification of resources available to assist in meeting health care needs will improve Outcome: Progressing   Problem: Medication: Goal: Compliance with prescribed medication regimen will improve Outcome: Progressing   Problem: Self-Concept: Goal: Ability to disclose and discuss suicidal ideas will improve Outcome: Progressing Goal: Will verbalize positive feelings about self Outcome: Progressing   Problem: Education: Goal: Utilization of techniques to improve thought processes will improve Outcome: Progressing Goal: Knowledge of the prescribed therapeutic regimen will improve Outcome: Progressing   Problem: Activity: Goal: Interest or engagement in leisure activities will improve Outcome: Progressing Goal: Imbalance in normal sleep/wake cycle will improve Outcome: Progressing   Problem: Coping: Goal: Coping ability will improve Outcome: Progressing Goal: Will verbalize feelings Outcome: Progressing   Problem: Health Behavior/Discharge Planning: Goal: Ability to make decisions will improve Outcome: Progressing Goal: Compliance with therapeutic regimen will improve Outcome: Progressing   Problem: Role Relationship: Goal: Will demonstrate positive changes in social behaviors and relationships Outcome: Progressing   Problem: Safety: Goal: Ability to disclose and discuss suicidal ideas will improve Outcome: Progressing Goal: Ability to identify and utilize support systems that promote safety will improve Outcome: Progressing   Problem: Self-Concept: Goal: Will verbalize positive feelings about self Outcome: Progressing Goal: Level of  anxiety will decrease Outcome: Progressing   Problem: Education: Goal: Ability to identify and develop effective coping behavior will improve Outcome: Progressing   Problem: Coping: Goal: Communication of feelings regarding changes in body function or appearance will improve Outcome: Progressing   Problem: Health Behavior/Discharge Planning: Goal: Identification of resources available to assist in meeting health care needs will improve Outcome: Progressing   Problem: Self-Concept: Goal: Expressions of positive opinion of body image will increase Outcome: Progressing Goal: Will verbalize positive feelings about self Outcome: Progressing   Problem: Education: Goal: Ability to state activities that reduce stress will improve Outcome: Progressing   Problem: Coping: Goal: Ability to identify and develop effective coping behavior will improve Outcome: Progressing   Problem: Self-Concept: Goal: Ability to identify factors that promote anxiety will improve Outcome: Progressing Goal: Level of anxiety will decrease Outcome: Progressing Goal: Ability to modify response to factors that promote anxiety will improve Outcome: Progressing

## 2019-02-14 NOTE — Progress Notes (Signed)
[facility] MD Progress Note  02/14/2019 2:14 PM Bradley Griffin  MRN:  [id number] Subjective: Follow-up for this gentleman with depression and anxiety possible bipolar disorder probably at least some degree of personality disorder.  For the second day in a row he is feeling significantly better.  He has kept his hygiene improved and continues to go to group and interact with others.  In terms of his social situation he has no new thoughts.  Social work has met with them and gone over a lot of his obstacles.  Not clear how much were going to be able to do to help with them right now.  1 new medical issue was that we got an EKG today and his QTC was actually well over 500.  He and I discussed this and have decided to start by discontinuing the [place].  Celexa obviously is much more affordable. Principal Problem: Bipolar 2 disorder (HCC) Diagnosis: Principal Problem:   Bipolar 2 disorder (HCC) Active Problems:   Major depressive disorder, recurrent severe without psychotic features ([place])   Borderline personality disorder ([place])   Basal cell carcinoma   Diabetes ([place])  Total Time spent with patient: 30 minutes  Past Psychiatric History: Past history of depression symptoms mood instability suicide attempts  Past Medical History:  Past Medical History:  Diagnosis Date  . Blindness of left eye   . Cancer [facility])    Skin cancer     Past Surgical History:  Procedure Laterality Date  . CHOLECYSTECTOMY     Family History: History reviewed. No pertinent family history. Family Psychiatric  History: See previous Social History:  Social History   Substance and Sexual Activity  Alcohol Use Never  . Frequency: Never     Social History   Substance and Sexual Activity  Drug Use Never    Social History   Socioeconomic History  . Marital status: Single    Spouse name: Not on file  . Number of children: Not on file  . Years of education: Not on file  . Highest education level: Not on file   Occupational History  . Not on file  Social Needs  . Financial resource strain: Not on file  . Food insecurity    Worry: Not on file    Inability: Not on file  . Transportation needs    Medical: Not on file    Non-medical: Not on file  Tobacco Use  . Smoking status: Never Smoker  . Smokeless tobacco: Never Used  Substance and Sexual Activity  . Alcohol use: Never    Frequency: Never  . Drug use: Never  . Sexual activity: Not Currently  Lifestyle  . Physical activity    Days per week: Not on file    Minutes per session: Not on file  . Stress: Not on file  Relationships  . Social Herbalist on phone: Not on file    Gets together: Not on file    Attends religious service: Not on file    Active member of club or organization: Not on file    Attends meetings of clubs or organizations: Not on file    Relationship status: Not on file  Other Topics Concern  . Not on file  Social History Narrative  . Not on file   Additional Social History:                         Sleep: Fair  Appetite:  Fair  Current Medications: Current Facility-Administered Medications  Medication Dose Route Frequency Provider Last Rate Last Dose  . acetaminophen (TYLENOL) tablet 650 mg  650 mg Oral Q6H PRN Patrecia Pour, NP      . alum & mag hydroxide-simeth (MAALOX/MYLANTA) 200-200-20 MG/5ML suspension 30 mL  30 mL Oral Q4H PRN Patrecia Pour, NP      . citalopram (CELEXA) tablet 20 mg  20 mg Oral Daily Sharma Covert, MD   20 mg at 02/14/19 0758  . influenza vaccine adjuvanted (FLUAD) injection 0.5 mL  0.5 mL Intramuscular Tomorrow-1000 Deeric Cruise T, MD      . magnesium hydroxide (MILK OF MAGNESIA) suspension 30 mL  30 mL Oral Daily PRN Patrecia Pour, NP      . metFORMIN (GLUCOPHAGE) tablet 1,000 mg  1,000 mg Oral BID WC Patrecia Pour, NP   1,000 mg at 02/14/19 0758  . pneumococcal 23 valent vaccine (PNU-IMMUNE) injection 0.5 mL  0.5 mL Intramuscular Tomorrow-1000  Libero Puthoff T, MD      . traZODone (DESYREL) tablet 50 mg  50 mg Oral QHS PRN Sharma Covert, MD        Lab Results:  Results for orders placed or performed during the hospital encounter of 02/08/19 (from the past 48 hour(s))  Glucose, capillary     Status: Abnormal   Collection Time: 02/14/19  7:14 AM  Result Value Ref Range   Glucose-Capillary 146 (H) 70 - 99 mg/dL   Comment 1 Notify RN     Blood Alcohol level:  No results found for: [facility]  Metabolic Disorder Labs: Lab Results  Component Value Date   HGBA1C 7.3 (H) 02/07/2019   MPG 163 02/07/2019   MPG 260.39 09/19/2018   No results found for: PROLACTIN No results found for: CHOL, TRIG, HDL, CHOLHDL, VLDL, LDLCALC  Physical Findings: AIMS: Facial and Oral Movements Muscles of Facial Expression: None, normal Lips and Perioral Area: None, normal Jaw: None, normal Tongue: None, normal,Extremity Movements Upper (arms, wrists, hands, fingers): None, normal Lower (legs, knees, ankles, toes): None, normal, Trunk Movements Neck, shoulders, hips: None, normal, Overall Severity Severity of abnormal movements (highest score from questions above): None, normal Incapacitation due to abnormal movements: None, normal Patient's awareness of abnormal movements (rate only patient's report): No Awareness, Dental Status Current problems with teeth and/or dentures?: No Does patient usually wear dentures?: No  CIWA:  CIWA-Ar Total: 2 COWS:  COWS Total Score: 0  Musculoskeletal: Strength & Muscle Tone: within normal limits Gait & Station: normal Patient leans: N/A  Psychiatric Specialty Exam: Physical Exam  Nursing note and vitals reviewed. Constitutional: He appears well-developed and well-nourished.  HENT:  Head: Normocephalic and atraumatic.  Eyes: Pupils are equal, round, and reactive to light. Conjunctivae are normal.  Neck: Normal range of motion.  Cardiovascular: Regular rhythm and normal heart sounds.  Respiratory:  Effort normal. No respiratory distress.  GI: Soft.  Musculoskeletal: Normal range of motion.  Neurological: He is alert.  Skin: Skin is warm and dry.  Psychiatric: He has a normal mood and affect. His speech is normal and behavior is normal. Judgment and thought content normal. Cognition and memory are normal.    Review of Systems  Constitutional: Negative.   HENT: Negative.   Eyes: Negative.   Respiratory: Negative.   Cardiovascular: Negative.   Gastrointestinal: Negative.   Musculoskeletal: Negative.   Skin: Negative.   Neurological: Negative.   Psychiatric/Behavioral: Negative.     Blood pressure 127/87, pulse 90,  temperature 97.6 F (36.4 C), temperature source Oral, resp. rate 18, height '5\' 9"'  (1.753 m), weight 129.7 kg, SpO2 99 %.Body mass index is 42.23 kg/m.  General Appearance: Casual  Eye Contact:  Fair  Speech:  Normal Rate  Volume:  Decreased  Mood:  Euthymic  Affect:  Congruent  Thought Process:  Coherent  Orientation:  Full (Time, Place, and Person)  Thought Content:  Logical  Suicidal Thoughts:  No  Homicidal Thoughts:  No  Memory:  Immediate;   Fair Recent;   Fair Remote;   Fair  Judgement:  Fair  Insight:  Fair  Psychomotor Activity:  Normal  Concentration:  Concentration: Fair  Recall:  [employer] of Knowledge:  Fair  Language:  Fair  Akathisia:  No  Handed:  Right  AIMS (if indicated):     Assets:  Desire for Improvement Physical Health  ADL's:  Impaired  Cognition:  WNL  Sleep:  Number of Hours: 2.5     Treatment Plan Summary: Daily contact with patient to assess and evaluate symptoms and progress in treatment, Medication management and Plan As far as depression his mood seems to be improved.  He is not actively suicidal.  We reviewed his medication history.  It is not all that clear how much medication has been helpful to him.  Latuda while potentially helpful in the past is obviously extremely expensive so we will choose to discontinue that  1 and then recheck an EKG may be in a few days to see if the QTC is still prolonged.  Patient aware of the plan.  His diabetes seems to be adequately or at least sort of adequately improved with oral medicine.  He still does not want to use insulin if possible so we are not using a sliding scale.  I did call the dermatology consultant practice and leave a message requesting a consult.  Does not appear that that is been done.  Not clear that they would do that for an inpatient.  In any case not likely that anything can be done in the short-term in the hospital anyway..  Finally his social problems are nearly insurmountable in some ways.  Social work will see what they can come up with.  Possible discharge soon after the weekend.  No other specific change in treatment at this point.  Alethia Berthold, MD 02/14/2019, 2:14 PM

## 2019-02-14 NOTE — Progress Notes (Signed)
Recreation Therapy Notes   Date: 02/14/2019  Time: 1:00pm   Location: Outside  Behavioral response: Appropriate   Intervention Topic: Leisure  Discussion/Intervention:  Group content today was focused on leisure. The group defined what leisure is and some positive leisure activities they participate in. Individuals identified the difference between good and bad leisure. Participants expressed how they feel after participating in the leisure of their choice. The group discussed how they go about picking a leisure activity and if others are involved in their leisure activities. The patient stated how many leisure activities they too choose from and reasons why it is important to have leisure time. Individuals participated in the intervention "Exploration of Leisure" where they had a chance to identify new leisure activities as well as benefits of leisure.  Clinical Observations/Feedback:  Patient came to group and explained that he likes to play scrabble during his leisure time. Individual was social with peers and staff while participating in the intervention.   Bradley Griffin LRT/CTRS         Bradley Griffin 02/14/2019 2:25 PM

## 2019-02-14 NOTE — BHH Counselor (Signed)
CSW received message from physician stating pt is improving and may be able to discharge after the weekend. CSW met with pt to discuss a potential discharge plan. Pt reports he received a large lump sum of money from his mother's inheritance that allowed him to live comfortably for the past 7 years. He says those funds ran out four weeks ago which led to an increase in depressive sxs and SI. Pt reports he is 3 months behind on his rent(lives in [place], [address]) and unsure if he paid this months electric bill. He says he owns a vehicle but drove it to [facility] where he says he was attempting to harm himself. He is now unsure of [place] of [place], [place]. When asked about social security benefit, he states if he applies now he will receive $109 per month but if he waits until January 2020 his benefit will be $600 per month. He states he is open to receiving outpatient treatment and would like a referral to [facility].He reports he was a patient there four years ago, where he was followed by Dr. Timmothy Euler. CSW discussed with him peer support and community support team as resources that may be available to assist him with accessing community resources. He says he is open to receiving these services.   The pt does not have any insurance and says he would like to apply for Medicaid and food stamps. CSW informed him he will need to be linked to DSS to be assigned a worker that can assist him with receiving those services. CSW met with Dr. Weber Cooks and relayed this information. CSW inquired about pt's physical health and referrals to nursing homes  but Clapacs says he does not have physical health problems that would warrant a referral. CSW discussed with him making a referral to [facility] for community support team and/or peer support services. Dr. Weber Cooks states being in agreement.

## 2019-02-14 NOTE — Plan of Care (Signed)
Pt. Reports improved coping. Pt. Compliance with medications is improved. Pt. Denies si/hi/avh, able to contract for safety. Pt. More visible out in the milieu thus far. Pt. Endorses improving mood.   Problem: Coping: Goal: Coping ability will improve Outcome: Progressing   Problem: Medication: Goal: Compliance with prescribed medication regimen will improve Outcome: Progressing   Problem: Self-Concept: Goal: Ability to disclose and discuss suicidal ideas will improve Outcome: Progressing   Problem: Activity: Goal: Interest or engagement in leisure activities will improve Outcome: Progressing   Problem: Health Behavior/Discharge Planning: Goal: Compliance with therapeutic regimen will improve Outcome: Progressing

## 2019-02-14 NOTE — Plan of Care (Signed)
  Problem: Education: Goal: Ability to make informed decisions regarding treatment will improve Outcome: Progressing  Patient can make informed decisions regarding treatment.

## 2019-02-14 NOTE — [facility] Group Notes (Signed)
LCSW Group Therapy Note  02/14/2019 10:00 AM  Type of Therapy and Topic:  Group Therapy:  Feelings around Relapse and Recovery  Participation Level:  Minimal   Description of Group:    Patients in this group will discuss emotions they experience before and after a relapse. They will process how experiencing these feelings, or avoidance of experiencing them, relates to having a relapse. Facilitator will guide patients to explore emotions they have related to recovery. Patients will be encouraged to process which emotions are more powerful. They will be guided to discuss the emotional reaction significant others in their lives may have to their relapse or recovery. Patients will be assisted in exploring ways to respond to the emotions of others without this contributing to a relapse.  Therapeutic Goals: 1. Patient will identify two or more emotions that lead to a relapse for them 2. Patient will identify two emotions that result when they relapse 3. Patient will identify two emotions related to recovery 4. Patient will demonstrate ability to communicate their needs through discussion and/or role plays   Summary of Patient Progress: Patient attended group, however, did not engage in group discussion.  Patient appeared attentive to others.   Therapeutic Modalities:   Cognitive Behavioral Therapy Solution-Focused Therapy Assertiveness Training Relapse Prevention Therapy   Assunta Curtis, MSW, LCSW 02/14/2019 9:40 AM

## 2019-02-14 NOTE — Progress Notes (Signed)
D: Pt during assessments denies SI/HI/AVH, able to contract for safety. Pt is pleasant and cooperative, affect is brighter today. Pt. has no Complaints. Patient Interaction is improving. Pt. Endorses an improving mood.   A: Q x 15 minute observation checks in place for safety. Patient was and is provided with education throughout shift.  Patient was and will be given/offered medications per orders. Patient was and is encouraged to attend groups, participate in unit activities and continue with plan of care. Pt. Chart and plans of care reviewed. Pt. Given support and encouragement.   R: Patient is complaint with medication and unit procedures thus far. Pt. Observed eating good. Pt. Out more in the milieu around peers and staff. Pt. Ambulating fair, but still recommend to the patient to utilize high falls safety education given, but insists he is walking okay. Pt. Allowing blood sugars. Will attempt to gain compliance for needed EKG testing.

## 2019-02-15 DIAGNOSIS — F3181 Bipolar II disorder: Principal | ICD-10-CM

## 2019-02-15 NOTE — Plan of Care (Signed)
D: Pt during assessments denies SI/HI/AVH, able to contract for safety verbally. Pt is overall pleasant and cooperative, but engagement this morning was minimal, eye contact was poor, and the patient forwarded little. Pt. Expressed this morning he was feeling worse and, "woke up wrong, like I said". Pt. Encouraged to utilize safety equipment for ambulation, but insists he does not need. High falls education reviewed again today.  A: Q x 15 minute observation checks in place for safety. Patient was and is provided with education throughout shift. Patient was and will be given/offered medications per orders. Patient was and is encouraged to attend groups, participate in unit activities and continue with plan of care. Pt. Chart and plans of care reviewed. Pt. Given support and encouragement.   R: Patient is complaint with medication and unit procedures with direction and encouragement. Pt. Allowing blood sugar checks. Pt. During the morning was more isolative and withdrawn, but as the day has progressed he has been observed a bit out more. Overall today has seemed to be not as progressing, due to when patient spoke to provider- he again, began to express suicidal ideations. Affect noticeably improve since this morning though.    Problem: Coping: Goal: Coping ability will improve Outcome: Not Progressing   Problem: Medication: Goal: Compliance with prescribed medication regimen will improve Outcome: Progressing   Problem: Self-Concept: Goal: Ability to disclose and discuss suicidal ideas will improve Outcome: Not Progressing   Problem: Activity: Goal: Interest or engagement in leisure activities will improve Outcome: Not Progressing   Problem: Coping: Goal: Coping ability will improve Outcome: Not Progressing Goal: Will verbalize feelings Outcome: Not Progressing   Problem: Health Behavior/Discharge Planning: Goal: Compliance with therapeutic regimen will improve Outcome: Progressing

## 2019-02-15 NOTE — Progress Notes (Signed)
[facility] MD Progress Note  02/15/2019 9:34 AM Bradley Griffin  MRN:  [id number] Subjective:   Patient in bed generally passive and overall cooperative.  Psychomotor retardation noted.  Confesses that he is homeless so he is not eager for discharge but acknowledges depression, thoughts of self-harm without plans or intent that are intermittent, denies auditory or visual hallucinations.  Denies thoughts of harming others.  No involuntary movements.  Does not engage much in cognitive-based therapy but meds are discussed/med and illness education he requests no medication changes believes he is making some progress Principal Problem: Bipolar 2 disorder ([place]) Diagnosis: Principal Problem:   Bipolar 2 disorder ([place]) Active Problems:   Major depressive disorder, recurrent severe without psychotic features ([place])   Borderline personality disorder ([place])   Basal cell carcinoma   Diabetes ([place])  Total Time spent with patient: 20 minutes  Past Psychiatric History: History of being diagnosed with borderline personality disorder in addition to recurrent depression, team considering type II bipolar as well  Past Medical History:  Past Medical History:  Diagnosis Date  . Blindness of left eye   . Cancer [facility])    Skin cancer     Past Surgical History:  Procedure Laterality Date  . CHOLECYSTECTOMY     Family History: History reviewed. No pertinent family history. Family Psychiatric  History: No new data shared Social History:  Social History   Substance and Sexual Activity  Alcohol Use Never  . Frequency: Never     Social History   Substance and Sexual Activity  Drug Use Never    Social History   Socioeconomic History  . Marital status: Single    Spouse name: Not on file  . Number of children: Not on file  . Years of education: Not on file  . Highest education level: Not on file  Occupational History  . Not on file  Social Needs  . Financial resource strain: Not on file  . Food  insecurity    Worry: Not on file    Inability: Not on file  . Transportation needs    Medical: Not on file    Non-medical: Not on file  Tobacco Use  . Smoking status: Never Smoker  . Smokeless tobacco: Never Used  Substance and Sexual Activity  . Alcohol use: Never    Frequency: Never  . Drug use: Never  . Sexual activity: Not Currently  Lifestyle  . Physical activity    Days per week: Not on file    Minutes per session: Not on file  . Stress: Not on file  Relationships  . Social Herbalist on phone: Not on file    Gets together: Not on file    Attends religious service: Not on file    Active member of club or organization: Not on file    Attends meetings of clubs or organizations: Not on file    Relationship status: Not on file  Other Topics Concern  . Not on file  Social History Narrative  . Not on file   Additional Social History:                         Sleep: Good  Appetite:  Good  Current Medications: Current Facility-Administered Medications  Medication Dose Route Frequency Provider Last Rate Last Dose  . acetaminophen (TYLENOL) tablet 650 mg  650 mg Oral Q6H PRN Patrecia Pour, NP      . alum & Iris Pert  hydroxide-simeth (MAALOX/MYLANTA) 200-200-20 MG/5ML suspension 30 mL  30 mL Oral Q4H PRN Patrecia Pour, NP      . citalopram (CELEXA) tablet 20 mg  20 mg Oral Daily Sharma Covert, MD   20 mg at 02/15/19 0748  . influenza vaccine adjuvanted (FLUAD) injection 0.5 mL  0.5 mL Intramuscular Tomorrow-1000 Clapacs, John T, MD      . magnesium hydroxide (MILK OF MAGNESIA) suspension 30 mL  30 mL Oral Daily PRN Patrecia Pour, NP      . metFORMIN (GLUCOPHAGE) tablet 1,000 mg  1,000 mg Oral BID WC Patrecia Pour, NP   1,000 mg at 02/15/19 0748  . pneumococcal 23 valent vaccine (PNU-IMMUNE) injection 0.5 mL  0.5 mL Intramuscular Tomorrow-1000 Clapacs, John T, MD      . traZODone (DESYREL) tablet 50 mg  50 mg Oral QHS PRN Sharma Covert, MD         Lab Results:  Results for orders placed or performed during the hospital encounter of 02/08/19 (from the past 48 hour(s))  Glucose, capillary     Status: Abnormal   Collection Time: 02/14/19  7:14 AM  Result Value Ref Range   Glucose-Capillary 146 (H) 70 - 99 mg/dL   Comment 1 Notify RN     Blood Alcohol level:  No results found for: [employer]  Metabolic Disorder Labs: Lab Results  Component Value Date   HGBA1C 7.3 (H) 02/07/2019   MPG 163 02/07/2019   MPG 260.39 09/19/2018   No results found for: PROLACTIN No results found for: CHOL, TRIG, HDL, CHOLHDL, VLDL, LDLCALC  Physical Findings: AIMS: Facial and Oral Movements Muscles of Facial Expression: None, normal Lips and Perioral Area: None, normal Jaw: None, normal Tongue: None, normal,Extremity Movements Upper (arms, wrists, hands, fingers): None, normal Lower (legs, knees, ankles, toes): None, normal, Trunk Movements Neck, shoulders, hips: None, normal, Overall Severity Severity of abnormal movements (highest score from questions above): None, normal Incapacitation due to abnormal movements: None, normal Patient's awareness of abnormal movements (rate only patient's report): No Awareness, Dental Status Current problems with teeth and/or dentures?: No Does patient usually wear dentures?: No  CIWA:  CIWA-Ar Total: 2 COWS:  COWS Total Score: 0  Musculoskeletal: Strength & Muscle Tone: within normal limits Gait & Station: normal Patient leans: N/A  Psychiatric Specialty Exam: Physical Exam  ROS  Blood pressure 102/72, pulse (!) 57, temperature 97.9 F (36.6 C), temperature source Oral, resp. rate 16, height 5\' 9"  (1.753 m), weight 129.7 kg, SpO2 98 %.Body mass index is 42.23 kg/m.  General Appearance: Casual  Eye Contact:  Minimal  Speech:  Slow  Volume:  Decreased  Mood:  Dysphoric  Affect:  Blunt  Thought Process:  Coherent and Descriptions of Associations: Intact  Orientation:  Full (Time, Place, and Person)   Thought Content:  Logical  Suicidal Thoughts:  Yes.  without intent/plan  Homicidal Thoughts:  No  Memory:  Immediate;   Fair Recent;   Fair Remote;   Fair  Judgement:  Fair  Insight:  Fair  Psychomotor Activity:  Normal  Concentration:  Concentration: Fair and Attention Span: Fair  Recall:  [employer] of Knowledge:  Fair  Language:  Fair  Akathisia:  Negative  Handed:  Right  AIMS (if indicated):     Assets:  Communication Skills Leisure Time Resilience  ADL's:  Intact  Cognition:  WNL  Sleep:  Number of Hours: 5.75     Treatment Plan Summary: Daily contact with  patient to assess and evaluate symptoms and progress in treatment and Medication management  Continue current precautions continue to monitor for safety under 15-minute checks continue current antidepressant and lurasidone combination for depressive symptoms.  Issues of secondary gain discussed patient will probably stay through the weekend  [facility], MD 02/15/2019, 9:34 AM

## 2019-02-15 NOTE — Progress Notes (Signed)
Patient non-compliant with blood sugar check for dinner.

## 2019-02-15 NOTE — BHH Group Notes (Signed)
LCSW Aftercare Discharge Planning Group Note   02/15/2019 1315  Type of Group and Topic: Psychoeducational Group:  Discharge Planning  Participation Level:  Did Not Attend  Description of Group  Discharge planning group reviews patient's anticipated discharge plans and assists patients to anticipate and address any barriers to wellness/recovery in the community.  Suicide prevention education is reviewed with patients in group.  Therapeutic Goals 1. Patients will state their anticipated discharge plan and mental health aftercare 2. Patients will identify potential barriers to wellness in the community setting 3. Patients will engage in problem solving, solution focused discussion of ways to anticipate and address barriers to wellness/recovery  Summary of Patient Progress   Plan for Discharge/Comments:    Transportation Means:   Supports:  Therapeutic Modalities: Motivational Interviewing    Joanne Chars, LCSW 02/15/2019 1:51 PM

## 2019-02-16 LAB — GLUCOSE, CAPILLARY
Glucose-Capillary: 124 mg/dL — ABNORMAL HIGH (ref 70–99)
Glucose-Capillary: 109 mg/dL — ABNORMAL HIGH (ref 70–99)
Glucose-Capillary: 130 mg/dL — ABNORMAL HIGH (ref 70–99)
Glucose-Capillary: 128 mg/dL — ABNORMAL HIGH (ref 70–99)
Glucose-Capillary: 110 mg/dL — ABNORMAL HIGH (ref 70–99)
Glucose-Capillary: 113 mg/dL — ABNORMAL HIGH (ref 70–99)
Glucose-Capillary: 112 mg/dL — ABNORMAL HIGH (ref 70–99)
Glucose-Capillary: 106 mg/dL — ABNORMAL HIGH (ref 70–99)

## 2019-02-16 NOTE — BHH Group Notes (Signed)
LCSW Group Therapy Note 02/16/2019 1:15pm  Type of Therapy and Topic: Group Therapy: Feelings Around Returning Home & Establishing a Supportive Framework and Supporting Oneself When Supports Not Available  Participation Level: Active  Description of Group:  Patients first processed thoughts and feelings about upcoming discharge. These included fears of upcoming changes, lack of change, new living environments, judgements and expectations from others and overall stigma of mental health issues. The group then discussed the definition of a supportive framework, what that looks and feels like, and how do to discern it from an unhealthy non-supportive network. The group identified different types of supports as well as what to do when your family/friends are less than helpful or unavailable  Therapeutic Goals  1. Patient will identify one healthy supportive network that they can use at discharge. 2. Patient will identify one factor of a supportive framework and how to tell it from an unhealthy network. 3. Patient able to identify one coping skill to use when they do not have positive supports from others. 4. Patient will demonstrate ability to communicate their needs through discussion and/or role plays.  Summary of Patient Progress:  The patient reported he feels "alright" and is grateful for "sunlight." Pt engaged during group session. As patients processed their anxiety about discharge and described healthy supports patient shared he is not ready to be discharged. Patient stated, "I don't want to discus it right now." Patient stated he does not have a support system. Patients identified at least one self-care tool they were willing to use after discharge; ask a lot of questions.   Therapeutic Modalities Cognitive Behavioral Therapy Motivational Interviewing   Etha Stambaugh  CUEBAS-COLON, LCSW 02/16/2019 10:36 AM

## 2019-02-16 NOTE — Progress Notes (Signed)
Patient alert and oriented x 4. Patient's HR noted 51 this morning while resting. Patient denies any chest pain. Patient's  affect is neutral. Upon approach, he is pleasant. No distress noted. Interacting appropriately with peers and staff. Denies SI/HI/AVH 15 minutes safety checks maintained will continue to monitor.

## 2019-02-16 NOTE — Plan of Care (Signed)
Patient is stable and socializing well with peers , has no physical complains , denies any suicidal or homicidal ideations, patient states I'm tied of this place , support and encouragement is provided , patient verbally contract for safety, did not request for any PRNs and only requires 15 minutes safety checks no distress.

## 2019-02-16 NOTE — Progress Notes (Signed)
[facility] MD Progress Note  02/16/2019 10:09 AM Bradley Griffin  MRN:  [id number] Subjective:    Patient remains in bed passive he denies thoughts of harming himself he states he thinks he will be ready for discharge tomorrow.  Alert and oriented affect constricted poor eye contact acknowledging some residual depression but no thoughts of harming himself and can contract and understands what that means.  No mania no psychosis.  States he can make some housing arrangements but is not more specific than that Principal Problem: Bipolar 2 disorder ([place]) Diagnosis: Principal Problem:   Bipolar 2 disorder ([place]) Active Problems:   Major depressive disorder, recurrent severe without psychotic features ([place])   Borderline personality disorder ([place])   Basal cell carcinoma   Diabetes ([place])  Total Time spent with patient: 20 minutes  Past Psychiatric History: See eval  Past Medical History:  Past Medical History:  Diagnosis Date  . Blindness of left eye   . Cancer [facility])    Skin cancer     Past Surgical History:  Procedure Laterality Date  . CHOLECYSTECTOMY     Family History: History reviewed. No pertinent family history. Family Psychiatric  History: No new data Social History:  Social History   Substance and Sexual Activity  Alcohol Use Never  . Frequency: Never     Social History   Substance and Sexual Activity  Drug Use Never    Social History   Socioeconomic History  . Marital status: Single    Spouse name: Not on file  . Number of children: Not on file  . Years of education: Not on file  . Highest education level: Not on file  Occupational History  . Not on file  Social Needs  . Financial resource strain: Not on file  . Food insecurity    Worry: Not on file    Inability: Not on file  . Transportation needs    Medical: Not on file    Non-medical: Not on file  Tobacco Use  . Smoking status: Never Smoker  . Smokeless tobacco: Never Used  Substance and Sexual Activity   . Alcohol use: Never    Frequency: Never  . Drug use: Never  . Sexual activity: Not Currently  Lifestyle  . Physical activity    Days per week: Not on file    Minutes per session: Not on file  . Stress: Not on file  Relationships  . Social Herbalist on phone: Not on file    Gets together: Not on file    Attends religious service: Not on file    Active member of club or organization: Not on file    Attends meetings of clubs or organizations: Not on file    Relationship status: Not on file  Other Topics Concern  . Not on file  Social History Narrative  . Not on file   Additional Social History:                         Sleep: Fair  Appetite:  Fair  Current Medications: Current Facility-Administered Medications  Medication Dose Route Frequency Provider Last Rate Last Dose  . acetaminophen (TYLENOL) tablet 650 mg  650 mg Oral Q6H PRN Patrecia Pour, NP      . alum & mag hydroxide-simeth (MAALOX/MYLANTA) 200-200-20 MG/5ML suspension 30 mL  30 mL Oral Q4H PRN Patrecia Pour, NP      . citalopram (CELEXA) tablet 20 mg  20 mg Oral Daily Sharma Covert, MD   20 mg at 02/16/19 0735  . influenza vaccine adjuvanted (FLUAD) injection 0.5 mL  0.5 mL Intramuscular Tomorrow-1000 Clapacs, John T, MD      . magnesium hydroxide (MILK OF MAGNESIA) suspension 30 mL  30 mL Oral Daily PRN Patrecia Pour, NP      . metFORMIN (GLUCOPHAGE) tablet 1,000 mg  1,000 mg Oral BID WC Patrecia Pour, NP   1,000 mg at 02/16/19 0734  . pneumococcal 23 valent vaccine (PNU-IMMUNE) injection 0.5 mL  0.5 mL Intramuscular Tomorrow-1000 Clapacs, John T, MD      . traZODone (DESYREL) tablet 50 mg  50 mg Oral QHS PRN Sharma Covert, MD        Lab Results: No results found for this or any previous visit (from the past 48 hour(s)).  Blood Alcohol level:  No results found for: [facility]  Metabolic Disorder Labs: Lab Results  Component Value Date   HGBA1C 7.3 (H) 02/07/2019   MPG 163  02/07/2019   MPG 260.39 09/19/2018   No results found for: PROLACTIN No results found for: CHOL, TRIG, HDL, CHOLHDL, VLDL, LDLCALC  Physical Findings: AIMS: Facial and Oral Movements Muscles of Facial Expression: None, normal Lips and Perioral Area: None, normal Jaw: None, normal Tongue: None, normal,Extremity Movements Upper (arms, wrists, hands, fingers): None, normal Lower (legs, knees, ankles, toes): None, normal, Trunk Movements Neck, shoulders, hips: None, normal, Overall Severity Severity of abnormal movements (highest score from questions above): None, normal Incapacitation due to abnormal movements: None, normal Patient's awareness of abnormal movements (rate only patient's report): No Awareness, Dental Status Current problems with teeth and/or dentures?: No Does patient usually wear dentures?: No  CIWA:  CIWA-Ar Total: 2 COWS:  COWS Total Score: 0  Musculoskeletal: Strength & Muscle Tone: within normal limits Gait & Station: normal Patient leans: N/A  Psychiatric Specialty Exam: Physical Exam  ROS  Blood pressure 105/65, pulse (!) 51, temperature 98.7 F (37.1 C), temperature source Oral, resp. rate 18, height 5\' 9"  (1.753 m), weight 129.7 kg, SpO2 100 %.Body mass index is 42.23 kg/m.  General Appearance: Casual  Eye Contact:  Poor  Speech:  Slow  Volume:  Decreased  Mood:  Dysphoric  Affect:  Congruent  Thought Process:  Goal Directed and Descriptions of Associations: Intact  Orientation:  Full (Time, Place, and Person)  Thought Content:  Logical  Suicidal Thoughts:  No  Homicidal Thoughts:  No  Memory:  Immediate;   Fair Recent;   Fair Remote;   Fair  Judgement:  Fair  Insight:  Fair  Psychomotor Activity:  Normal  Concentration:  Concentration: Fair and Attention Span: Fair  Recall:  [employer] of Knowledge:  Fair  Language:  Fair  Akathisia:  Negative  Handed:  Right  AIMS (if indicated):     Assets:  Leisure Time Physical  Health Resilience Social Support  ADL's:  Intact  Cognition:  WNL  Sleep:  Number of Hours: 8.5     Treatment Plan Summary: Daily contact with patient to assess and evaluate symptoms and progress in treatment and Medication management  Continue 15-minute precautions continue to monitor for safety continue to engage in cognitive therapy individually and with groups probable discharge tomorrow based on his statements no change in meds today  Enzo Treu, MD 02/16/2019, 10:09 AM

## 2019-02-16 NOTE — Plan of Care (Signed)
D: Pt during assessments denies SI/HI/AVH, able to contract for safety. Pt. Affect brighter from yesterday, endorses an improved mood. Pt. Has no complaints. Denies pain.   A: Q x 15 minute observation checks in place for safety. Patient was and is provided with education throughout shift.  Patient was and will be given/offered medications per orders. Patient was and is encouraged to attend groups, participate in unit activities and continue with plan of care. Pt. Chart and plans of care reviewed. Pt. Given support and encouragement.   R: Patient is complaint with medication and unit procedures. Pt. Has been eating good. Pt. Reviewed high falls risks education again for safety. Pt. Has been out at times watching tv, but mostly isolative and withdrawn resting in bed.         Problem: Education: Goal: Ability to make informed decisions regarding treatment will improve Outcome: Progressing   Problem: Coping: Goal: Coping ability will improve Outcome: Progressing   Problem: Health Behavior/Discharge Planning: Goal: Identification of resources available to assist in meeting health care needs will improve Outcome: Progressing   Problem: Medication: Goal: Compliance with prescribed medication regimen will improve Outcome: Progressing   Problem: Self-Concept: Goal: Ability to disclose and discuss suicidal ideas will improve Outcome: Progressing Goal: Will verbalize positive feelings about self Outcome: Progressing   Problem: Education: Goal: Utilization of techniques to improve thought processes will improve Outcome: Progressing Goal: Knowledge of the prescribed therapeutic regimen will improve Outcome: Progressing   Problem: Activity: Goal: Interest or engagement in leisure activities will improve Outcome: Progressing Goal: Imbalance in normal sleep/wake cycle will improve Outcome: Progressing   Problem: Coping: Goal: Coping ability will improve Outcome: Progressing Goal:  Will verbalize feelings Outcome: Progressing   Problem: Health Behavior/Discharge Planning: Goal: Ability to make decisions will improve Outcome: Progressing Goal: Compliance with therapeutic regimen will improve Outcome: Progressing   Problem: Role Relationship: Goal: Will demonstrate positive changes in social behaviors and relationships Outcome: Progressing   Problem: Safety: Goal: Ability to disclose and discuss suicidal ideas will improve Outcome: Progressing Goal: Ability to identify and utilize support systems that promote safety will improve Outcome: Progressing   Problem: Self-Concept: Goal: Will verbalize positive feelings about self Outcome: Progressing Goal: Level of anxiety will decrease Outcome: Progressing   Problem: Education: Goal: Ability to identify and develop effective coping behavior will improve Outcome: Progressing   Problem: Coping: Goal: Communication of feelings regarding changes in body function or appearance will improve Outcome: Progressing   Problem: Health Behavior/Discharge Planning: Goal: Identification of resources available to assist in meeting health care needs will improve Outcome: Progressing   Problem: Self-Concept: Goal: Expressions of positive opinion of body image will increase Outcome: Progressing Goal: Will verbalize positive feelings about self Outcome: Progressing   Problem: Education: Goal: Ability to state activities that reduce stress will improve Outcome: Progressing   Problem: Coping: Goal: Ability to identify and develop effective coping behavior will improve Outcome: Progressing   Problem: Self-Concept: Goal: Ability to identify factors that promote anxiety will improve Outcome: Progressing Goal: Level of anxiety will decrease Outcome: Progressing Goal: Ability to modify response to factors that promote anxiety will improve Outcome: Progressing

## 2019-02-17 LAB — GLUCOSE, CAPILLARY: Glucose-Capillary: 111 mg/dL — ABNORMAL HIGH (ref 70–99)

## 2019-02-17 NOTE — Progress Notes (Signed)
Recreation Therapy Notes  Date: 02/17/2019  Time: 9:30 am    Location: Craft room   Behavioral response: N/A   Intervention Topic: Stress  Discussion/Intervention: Patient did not attend group.   Clinical Observations/Feedback:  Patient did not attend group.   Marilu Rylander LRT/CTRS        Cassidy Tabet 02/17/2019 10:54 AM

## 2019-02-17 NOTE — Plan of Care (Signed)
Problem: Education: Goal: Ability to make informed decisions regarding treatment will improve 02/17/2019 2332 by Clemens Catholic, RN Outcome: Progressing 02/17/2019 2259 by Clemens Catholic, RN Outcome: Progressing   Problem: Coping: Goal: Coping ability will improve 02/17/2019 2332 by Clemens Catholic, RN Outcome: Progressing 02/17/2019 2259 by Clemens Catholic, RN Outcome: Progressing   Problem: Health Behavior/Discharge Planning: Goal: Identification of resources available to assist in meeting health care needs will improve 02/17/2019 2332 by Clemens Catholic, RN Outcome: Progressing 02/17/2019 2259 by Clemens Catholic, RN Outcome: Progressing   Problem: Medication: Goal: Compliance with prescribed medication regimen will improve 02/17/2019 2332 by Clemens Catholic, RN Outcome: Progressing 02/17/2019 2259 by Clemens Catholic, RN Outcome: Progressing   Problem: Self-Concept: Goal: Ability to disclose and discuss suicidal ideas will improve 02/17/2019 2332 by Clemens Catholic, RN Outcome: Progressing 02/17/2019 2259 by Clemens Catholic, RN Outcome: Progressing Goal: Will verbalize positive feelings about self 02/17/2019 2332 by Clemens Catholic, RN Outcome: Progressing 02/17/2019 2259 by Clemens Catholic, RN Outcome: Progressing   Problem: Education: Goal: Utilization of techniques to improve thought processes will improve 02/17/2019 2332 by Clemens Catholic, RN Outcome: Progressing 02/17/2019 2259 by Clemens Catholic, RN Outcome: Progressing Goal: Knowledge of the prescribed therapeutic regimen will improve 02/17/2019 2332 by Clemens Catholic, RN Outcome: Progressing 02/17/2019 2259 by Clemens Catholic, RN Outcome: Progressing   Problem: Activity: Goal: Interest or engagement in leisure activities will improve 02/17/2019 2332 by Clemens Catholic, RN Outcome: Progressing 02/17/2019 2259 by Clemens Catholic, RN Outcome: Progressing Goal:  Imbalance in normal sleep/wake cycle will improve 02/17/2019 2332 by Clemens Catholic, RN Outcome: Progressing 02/17/2019 2259 by Clemens Catholic, RN Outcome: Progressing   Problem: Coping: Goal: Coping ability will improve 02/17/2019 2332 by Clemens Catholic, RN Outcome: Progressing 02/17/2019 2259 by Clemens Catholic, RN Outcome: Progressing Goal: Will verbalize feelings 02/17/2019 2332 by Clemens Catholic, RN Outcome: Progressing 02/17/2019 2259 by Clemens Catholic, RN Outcome: Progressing   Problem: Health Behavior/Discharge Planning: Goal: Ability to make decisions will improve 02/17/2019 2332 by Clemens Catholic, RN Outcome: Progressing 02/17/2019 2259 by Clemens Catholic, RN Outcome: Progressing Goal: Compliance with therapeutic regimen will improve 02/17/2019 2332 by Clemens Catholic, RN Outcome: Progressing 02/17/2019 2259 by Clemens Catholic, RN Outcome: Progressing   Problem: Role Relationship: Goal: Will demonstrate positive changes in social behaviors and relationships 02/17/2019 2332 by Clemens Catholic, RN Outcome: Progressing 02/17/2019 2259 by Clemens Catholic, RN Outcome: Progressing   Problem: Safety: Goal: Ability to disclose and discuss suicidal ideas will improve 02/17/2019 2332 by Clemens Catholic, RN Outcome: Progressing 02/17/2019 2259 by Clemens Catholic, RN Outcome: Progressing Goal: Ability to identify and utilize support systems that promote safety will improve 02/17/2019 2332 by Clemens Catholic, RN Outcome: Progressing 02/17/2019 2259 by Clemens Catholic, RN Outcome: Progressing   Problem: Self-Concept: Goal: Will verbalize positive feelings about self 02/17/2019 2332 by Clemens Catholic, RN Outcome: Progressing 02/17/2019 2259 by Clemens Catholic, RN Outcome: Progressing Goal: Level of anxiety will decrease 02/17/2019 2332 by Clemens Catholic, RN Outcome: Progressing 02/17/2019 2259 by Clemens Catholic,  RN Outcome: Progressing   Problem: Education: Goal: Ability to identify and develop effective coping behavior will improve 02/17/2019 2332 by Clemens Catholic, RN Outcome: Progressing 02/17/2019 2259 by Clemens Catholic, RN Outcome: Progressing   Problem: Coping: Goal: Communication of feelings regarding changes in body function or appearance will improve 02/17/2019 2332 by Clemens Catholic, RN Outcome: Progressing 02/17/2019 2259 by Clemens Catholic, RN Outcome: Progressing   Problem: Health Behavior/Discharge Planning: Goal: Identification of resources available to assist in meeting health care needs will improve  02/17/2019 2332 by Clemens Catholic, RN Outcome: Progressing 02/17/2019 2259 by Clemens Catholic, RN Outcome: Progressing   Problem: Self-Concept: Goal: Expressions of positive opinion of body image will increase 02/17/2019 2332 by Clemens Catholic, RN Outcome: Progressing 02/17/2019 2259 by Clemens Catholic, RN Outcome: Progressing Goal: Will verbalize positive feelings about self 02/17/2019 2332 by Clemens Catholic, RN Outcome: Progressing 02/17/2019 2259 by Clemens Catholic, RN Outcome: Progressing   Problem: Education: Goal: Ability to state activities that reduce stress will improve 02/17/2019 2332 by Clemens Catholic, RN Outcome: Progressing 02/17/2019 2259 by Clemens Catholic, RN Outcome: Progressing   Problem: Coping: Goal: Ability to identify and develop effective coping behavior will improve 02/17/2019 2332 by Clemens Catholic, RN Outcome: Progressing 02/17/2019 2259 by Clemens Catholic, RN Outcome: Progressing   Problem: Self-Concept: Goal: Ability to identify factors that promote anxiety will improve 02/17/2019 2332 by Clemens Catholic, RN Outcome: Progressing 02/17/2019 2259 by Clemens Catholic, RN Outcome: Progressing Goal: Level of anxiety will decrease 02/17/2019 2332 by Clemens Catholic, RN Outcome:  Progressing 02/17/2019 2259 by Clemens Catholic, RN Outcome: Progressing Goal: Ability to modify response to factors that promote anxiety will improve 02/17/2019 2332 by Clemens Catholic, RN Outcome: Progressing 02/17/2019 2259 by Clemens Catholic, RN Outcome: Progressing

## 2019-02-17 NOTE — Plan of Care (Signed)
Patient is alert and stable and responding appropriately  upon approach , expresses concerns about impending discharge tomorrow , patient said that he is hopeless and worthless , education is provided and encourage apply some coping skills provided to him, patient appeared depressed , states that he wouldn't where to call home if he is discharged from here , encouraged patient to apply his coping skills , extensive education is provided, patient denies SI/HI/ AVH , no PRN requested , 15 minute safety check is maintained.

## 2019-02-17 NOTE — BHH Counselor (Signed)
CSW spoke with Hali Marry Liasion) who states he attempted to meet with the pt to complete intake paperwork but the pt said it was not good timing. Lanae Boast says he will continue to try and connect with the patient to link him to services at [facility].

## 2019-02-17 NOTE — Plan of Care (Signed)
When talked to patient about his discharge plans patient states in a passive way "I don't know.I don't know where I can go."Patient in & out of room.Attended groups.Denies SI,HI and AVH.Compliant with medications.Appetite and energy level good.Support and encouragement given.

## 2019-02-17 NOTE — BHH Group Notes (Signed)
LCSW Group Therapy Note   02/17/2019 12:51 PM   Type of Therapy and Topic:  Group Therapy:  Overcoming Obstacles   Participation Level:  Did Not Attend   Description of Group:    In this group patients will be encouraged to explore what they see as obstacles to their own wellness and recovery. They will be guided to discuss their thoughts, feelings, and behaviors related to these obstacles. The group will process together ways to cope with barriers, with attention given to specific choices patients can make. Each patient will be challenged to identify changes they are motivated to make in order to overcome their obstacles. This group will be process-oriented, with patients participating in exploration of their own experiences as well as giving and receiving support and challenge from other group members.   Therapeutic Goals: 1. Patient will identify personal and current obstacles as they relate to admission. 2. Patient will identify barriers that currently interfere with their wellness or overcoming obstacles.  3. Patient will identify feelings, thought process and behaviors related to these barriers. 4. Patient will identify two changes they are willing to make to overcome these obstacles:      Summary of Patient Progress x     Therapeutic Modalities:   Cognitive Behavioral Therapy Solution Focused Therapy Motivational Interviewing Relapse Prevention Therapy  Andria Head, MSW, LCSW Clinical Social Work 02/17/2019 12:51 PM   

## 2019-02-17 NOTE — BHH Counselor (Signed)
CSW provided patient with local community resource list.  List included information on [place] as pt had previously spoken with a member of [place] team about needing supporting with food stamps.  CSW discussed with patient at length about referral for CST team. Patient was noncommittal and declined to sign a consent at this time.   CSW spoke with patient about following up with services.  Patient became upset as evidenced by raised voice and statement "sure I'll just call them on my imaginary phone".  CSW redirected patient and informed patient that he has support to utilize the phone while on the unit during phone hours or with special permission.  CSW asked patient about access to his vehicle and patient stated several times "it's probably gone now".  CSW had to reframe several times for the patient to be [place].  CSW offered to assist patient in obtaining the contact information for [facility] so patient could check to see if his car remained in the lot or had been towed.    CSW did look the information up and the park will be closed until Wednesday.  Assunta Curtis, MSW, LCSW 02/17/2019 11:03 AM

## 2019-02-18 LAB — GLUCOSE, CAPILLARY
Glucose-Capillary: 118 mg/dL — ABNORMAL HIGH (ref 70–99)
Glucose-Capillary: 152 mg/dL — ABNORMAL HIGH (ref 70–99)
Glucose-Capillary: 115 mg/dL — ABNORMAL HIGH (ref 70–99)
Glucose-Capillary: 107 mg/dL — ABNORMAL HIGH (ref 70–99)

## 2019-02-18 NOTE — Progress Notes (Signed)
D: Patient stated slept fair  last night .Stated appetite good and energy level  Is normal. Stated concentration  good . Stated on Depression scale 2 , hopeless 4 and anxiety 0  .( low 0-10 high) Denies suicidal  homicidal ideations  .  No auditory hallucinations  No pain concerns . Appropriate ADL'S. Interacting with peers and staff. Voice no concerns around  wake , sleep cycle . Patient continue to work on  coping and   decision making   allowing finger stick  compliant with remainder  of medication and knowledgeable  . Patient looking at resources for discharge.  Patient able to alert staff for any concerns . Attending unit programing  Patient  Made an attempt to  Call apartment  For  His  Placement  Or if he could return .  Limited interaction  with peers .  A: Encourage patient participation with unit programming . Instruction  Given on  Medication , verbalize understanding.  R: Voice no other concerns. Staff continue to monitor

## 2019-02-18 NOTE — BHH Counselor (Signed)
CSW followed up with patient. Patinet reports that he called his apartment complex and he is [phone] behind in rent.  Patient has exact amount written on a post it note.  Patient did not ask if he can return or eviction process has been started.  Assunta Curtis, MSW, LCSW 02/18/2019 12:27 PM

## 2019-02-18 NOTE — BHH Counselor (Signed)
CSW attempted to call Amy with Financial Counseling to discuss if possible to begin the Medicaid/Medicare process for the patient.  CSW was unable to speak with Amy and left a message requesting a return phone call.  Message was HIPAA compliant.   Assunta Curtis, MSW, LCSW 02/18/2019 10:38 AM

## 2019-02-18 NOTE — Tx Team (Signed)
Interdisciplinary Treatment and Diagnostic Plan Update  02/18/2019 Time of Session: 8:30am  Bradley Griffin MRN: [id number]  Principal Diagnosis: Bipolar 2 disorder ([place])  Secondary Diagnoses: Principal Problem:   Bipolar 2 disorder ([place]) Active Problems:   Major depressive disorder, recurrent severe without psychotic features ([place])   Borderline personality disorder ([place])   Basal cell carcinoma   Diabetes ([place])   Current Medications:  Current Facility-Administered Medications  Medication Dose Route Frequency Provider Last Rate Last Dose  . acetaminophen (TYLENOL) tablet 650 mg  650 mg Oral Q6H PRN Patrecia Pour, NP      . alum & mag hydroxide-simeth (MAALOX/MYLANTA) 200-200-20 MG/5ML suspension 30 mL  30 mL Oral Q4H PRN Patrecia Pour, NP      . citalopram (CELEXA) tablet 20 mg  20 mg Oral Daily Sharma Covert, MD   20 mg at 02/18/19 0741  . influenza vaccine adjuvanted (FLUAD) injection 0.5 mL  0.5 mL Intramuscular Tomorrow-1000 Clapacs, John T, MD      . magnesium hydroxide (MILK OF MAGNESIA) suspension 30 mL  30 mL Oral Daily PRN Patrecia Pour, NP      . metFORMIN (GLUCOPHAGE) tablet 1,000 mg  1,000 mg Oral BID WC Patrecia Pour, NP   1,000 mg at 02/18/19 0741  . pneumococcal 23 valent vaccine (PNU-IMMUNE) injection 0.5 mL  0.5 mL Intramuscular Tomorrow-1000 Clapacs, John T, MD      . traZODone (DESYREL) tablet 50 mg  50 mg Oral QHS PRN Sharma Covert, MD        PTA Medications: No medications prior to admission.    Patient Stressors: Financial difficulties Health problems Medication change or noncompliance Occupational concerns  Patient Strengths: Average or above average intelligence Capable of independent living Communication skills Motivation for treatment/growth  Treatment Modalities: Medication Management, Group therapy, Case management,  1 to 1 session with clinician, Psychoeducation, Recreational therapy.   Physician Treatment Plan for Primary  Diagnosis: Bipolar 2 disorder ([place]) Long Term Goal(s): Improvement in symptoms so as ready for discharge  Short Term Goals: Ability to identify changes in lifestyle to reduce recurrence of condition will improve Ability to verbalize feelings will improve Ability to disclose and discuss suicidal ideas Ability to demonstrate self-control will improve Ability to identify and develop effective coping behaviors will improve Ability to maintain clinical measurements within normal limits will improve Ability to identify changes in lifestyle to reduce recurrence of condition will improve Ability to verbalize feelings will improve Ability to disclose and discuss suicidal ideas Ability to demonstrate self-control will improve Ability to identify and develop effective coping behaviors will improve Ability to maintain clinical measurements within normal limits will improve  Medication Management: Evaluate patient's response, side effects, and tolerance of medication regimen.  Therapeutic Interventions: 1 to 1 sessions, Unit Group sessions and Medication administration.  Evaluation of Outcomes: Progressing  Physician Treatment Plan for Secondary Diagnosis: Principal Problem:   Bipolar 2 disorder ([place]) Active Problems:   Major depressive disorder, recurrent severe without psychotic features ([place])   Borderline personality disorder ([place])   Basal cell carcinoma   Diabetes ([place])   Long Term Goal(s): Improvement in symptoms so as ready for discharge  Short Term Goals: Ability to identify changes in lifestyle to reduce recurrence of condition will improve Ability to verbalize feelings will improve Ability to disclose and discuss suicidal ideas Ability to demonstrate self-control will improve Ability to identify and develop effective coping behaviors will improve Ability to maintain clinical measurements within normal limits  will improve Ability to identify changes in lifestyle to reduce recurrence  of condition will improve Ability to verbalize feelings will improve Ability to disclose and discuss suicidal ideas Ability to demonstrate self-control will improve Ability to identify and develop effective coping behaviors will improve Ability to maintain clinical measurements within normal limits will improve  Medication Management: Evaluate patient's response, side effects, and tolerance of medication regimen.  Therapeutic Interventions: 1 to 1 sessions, Unit Group sessions and Medication administration.  Evaluation of Outcomes: Progressing   RN Treatment Plan for Primary Diagnosis: Bipolar 2 disorder ([place]) Long Term Goal(s): Knowledge of disease and therapeutic regimen to maintain health will improve  Short Term Goals: Ability to disclose and discuss suicidal ideas and Ability to identify and develop effective coping behaviors will improve  Medication Management: RN will administer medications as ordered by provider, will assess and evaluate patient's response and provide education to patient for prescribed medication. RN will report any adverse and/or side effects to prescribing provider.  Therapeutic Interventions: 1 on 1 counseling sessions, Psychoeducation, Medication administration, Evaluate responses to treatment, Monitor vital signs and CBGs as ordered, Perform/monitor CIWA, COWS, AIMS and Fall Risk screenings as ordered, Perform wound care treatments as ordered.  Evaluation of Outcomes: Progressing   LCSW Treatment Plan for Primary Diagnosis: Bipolar 2 disorder ([place]) Long Term Goal(s): Safe transition to appropriate next level of care at discharge, Engage patient in therapeutic group addressing interpersonal concerns.  Short Term Goals: Engage patient in aftercare planning with referrals and resources, Increase social support and Increase skills for wellness and recovery  Therapeutic Interventions: Assess for all discharge needs, 1 to 1 time with Social worker, Explore  available resources and support systems, Assess for adequacy in community support network, Educate family and significant other(s) on suicide prevention, Complete Psychosocial Assessment, Interpersonal group therapy.  Evaluation of Outcomes: Progressing  Pt states he will return home, but based on medical, mental health presentation, Dr Mallie Darting feels he needs ALF placement   Progress in Treatment: Attending groups: Yes Participating in groups: No Taking medication as prescribed: Yes Toleration medication: Yes, no side effects reported at this time Family/Significant other contact made: No, pt declined. Patient understands diagnosis: No  Limited insight Discussing patient identified problems/goals with staff: Yes Medical problems stabilized or resolved: Yes Denies suicidal/homicidal ideation: Yes Issues/concerns per patient self-inventory: None Other: N/A  New problem(s) identified: None identified at this time.   New Short Term/Long Term Goal(s): "I have no goals.  Where am I going after here?  I guess I will go back to my apartment." Update 02/18/2019:  medication management for mood stabilization; elimination of SI thoughts; development of comprehensive mental wellness plan.  Discharge Plan or Barriers: SPE pamphlet, Mobile Crisis information, and AA/NA information provided to patient for additional community support and resources. Pt continues to decline follow up treatment at this time. Update 02/18/2019:  Patient reports that he will complete the paperwork for [facility] with [facility].  Patient continues to be ambiguous about discharge plans. CSW team will contact Financial Counseling to see if any support is available to the patient for signing up for [facility].  CSW has identified the park where patient has left his car. Patient is to call the park on Wednesday to identify if car is still in the lot.   Reason for Continuation of Hospitalization:  Medication  stabilization Suicidal ideation   Estimated Length of Stay: TBD Attendees: Patient: 02/18/2019  9:54 AM  Physician: Dr Weber Cooks MD 02/18/2019  9:54  AM  Nursing:   02/18/2019  9:54 AM  Other:    Social Worker: Assunta Curtis, LCSW 02/18/2019  9:54 AM  Recreational Therapist:  02/18/2019  9:54 AM          Scribe for Treatment Team:  Evalina Field, MSW, LCSW Clinical Social Work 02/18/2019 9:54 AM

## 2019-02-18 NOTE — BHH Counselor (Signed)
CSW provided the patient with the contact information for [facility] and reminded patient that he needs to call on 9/23 to identify where his car is.    CSW also provided the patient with the contact information for his apartment complex.  CSW discussed with the patient calliing and identifying if he can return home, how much is owed and if he is behind on rent, how much rent is, etc.  Patient agreed to contact today. CSW will follow up with the patient.  Assunta Curtis, MSW, LCSW 02/18/2019 11:02 AM

## 2019-02-18 NOTE — Progress Notes (Signed)
[facility] MD Progress Note  02/18/2019 3:11 PM Bradley Griffin  MRN:  [id number] Subjective: Patient with bipolar disorder.  Patient seen in his room today.  He has no new complaints except that he asks if he could possibly see a podiatrist.  His toenails are evidently growing out quite long and he cannot reach them.  I expressed empathy for this but told him it was unlikely that podiatry would come by just for toenails.  Meanwhile his mood is pretty good.  Denies suicidal ideation.  Still has no thought about where he could possibly go to stay when he gets out of the hospital however. Principal Problem: Bipolar 2 disorder (HCC) Diagnosis: Principal Problem:   Bipolar 2 disorder (HCC) Active Problems:   Major depressive disorder, recurrent severe without psychotic features ([place])   Borderline personality disorder ([place])   Basal cell carcinoma   Diabetes ([place])  Total Time spent with patient: 30 minutes  Past Psychiatric History: Past history of depression and mood instability bipolar disorder  Past Medical History:  Past Medical History:  Diagnosis Date  . Blindness of left eye   . Cancer [facility])    Skin cancer     Past Surgical History:  Procedure Laterality Date  . CHOLECYSTECTOMY     Family History: History reviewed. No pertinent family history. Family Psychiatric  History: See previous Social History:  Social History   Substance and Sexual Activity  Alcohol Use Never  . Frequency: Never     Social History   Substance and Sexual Activity  Drug Use Never    Social History   Socioeconomic History  . Marital status: Single    Spouse name: Not on file  . Number of children: Not on file  . Years of education: Not on file  . Highest education level: Not on file  Occupational History  . Not on file  Social Needs  . Financial resource strain: Not on file  . Food insecurity    Worry: Not on file    Inability: Not on file  . Transportation needs    Medical: Not on file   Non-medical: Not on file  Tobacco Use  . Smoking status: Never Smoker  . Smokeless tobacco: Never Used  Substance and Sexual Activity  . Alcohol use: Never    Frequency: Never  . Drug use: Never  . Sexual activity: Not Currently  Lifestyle  . Physical activity    Days per week: Not on file    Minutes per session: Not on file  . Stress: Not on file  Relationships  . Social Herbalist on phone: Not on file    Gets together: Not on file    Attends religious service: Not on file    Active member of club or organization: Not on file    Attends meetings of clubs or organizations: Not on file    Relationship status: Not on file  Other Topics Concern  . Not on file  Social History Narrative  . Not on file   Additional Social History:                         Sleep: Poor  Appetite:  Fair  Current Medications: Current Facility-Administered Medications  Medication Dose Route Frequency Provider Last Rate Last Dose  . acetaminophen (TYLENOL) tablet 650 mg  650 mg Oral Q6H PRN Patrecia Pour, NP      . alum & mag hydroxide-simeth (MAALOX/MYLANTA)  200-200-20 MG/5ML suspension 30 mL  30 mL Oral Q4H PRN Patrecia Pour, NP      . citalopram (CELEXA) tablet 20 mg  20 mg Oral Daily Sharma Covert, MD   20 mg at 02/18/19 0741  . influenza vaccine adjuvanted (FLUAD) injection 0.5 mL  0.5 mL Intramuscular Tomorrow-1000 Mabry Santarelli T, MD      . magnesium hydroxide (MILK OF MAGNESIA) suspension 30 mL  30 mL Oral Daily PRN Patrecia Pour, NP      . metFORMIN (GLUCOPHAGE) tablet 1,000 mg  1,000 mg Oral BID WC Patrecia Pour, NP   1,000 mg at 02/18/19 0741  . pneumococcal 23 valent vaccine (PNU-IMMUNE) injection 0.5 mL  0.5 mL Intramuscular Tomorrow-1000 Nalini Alcaraz T, MD      . traZODone (DESYREL) tablet 50 mg  50 mg Oral QHS PRN Sharma Covert, MD        Lab Results:  Results for orders placed or performed during the hospital encounter of 02/08/19 (from the  past 48 hour(s))  Glucose, capillary     Status: Abnormal   Collection Time: 02/16/19  4:10 PM  Result Value Ref Range   Glucose-Capillary 111 (H) 70 - 99 mg/dL   Comment 1 Notify RN   Glucose, capillary     Status: Abnormal   Collection Time: 02/17/19  8:37 PM  Result Value Ref Range   Glucose-Capillary 118 (H) 70 - 99 mg/dL  Glucose, capillary     Status: Abnormal   Collection Time: 02/18/19  6:55 AM  Result Value Ref Range   Glucose-Capillary 152 (H) 70 - 99 mg/dL   Comment 1 Notify RN   Glucose, capillary     Status: Abnormal   Collection Time: 02/18/19 11:47 AM  Result Value Ref Range   Glucose-Capillary 115 (H) 70 - 99 mg/dL    Blood Alcohol level:  No results found for: [facility]  Metabolic Disorder Labs: Lab Results  Component Value Date   HGBA1C 7.3 (H) 02/07/2019   MPG 163 02/07/2019   MPG 260.39 09/19/2018   No results found for: PROLACTIN No results found for: CHOL, TRIG, HDL, CHOLHDL, VLDL, LDLCALC  Physical Findings: AIMS: Facial and Oral Movements Muscles of Facial Expression: None, normal Lips and Perioral Area: None, normal Jaw: None, normal Tongue: None, normal,Extremity Movements Upper (arms, wrists, hands, fingers): None, normal Lower (legs, knees, ankles, toes): None, normal, Trunk Movements Neck, shoulders, hips: None, normal, Overall Severity Severity of abnormal movements (highest score from questions above): None, normal Incapacitation due to abnormal movements: None, normal Patient's awareness of abnormal movements (rate only patient's report): No Awareness, Dental Status Current problems with teeth and/or dentures?: No Does patient usually wear dentures?: No  CIWA:  CIWA-Ar Total: 2 COWS:  COWS Total Score: 0  Musculoskeletal: Strength & Muscle Tone: within normal limits Gait & Station: normal Patient leans: N/A  Psychiatric Specialty Exam: Physical Exam  Nursing note and vitals reviewed. Constitutional: He appears well-developed and  well-nourished.  HENT:  Head: Normocephalic and atraumatic.  Eyes: Pupils are equal, round, and reactive to light. Conjunctivae are normal.  Neck: Normal range of motion.  Cardiovascular: Regular rhythm and normal heart sounds.  Respiratory: Effort normal. No respiratory distress.  GI: Soft.  Musculoskeletal: Normal range of motion.  Neurological: He is alert.  Skin: Skin is warm and dry.  Psychiatric: He has a normal mood and affect. His behavior is normal. Judgment and thought content normal.    Review of Systems  Constitutional: Negative.   HENT: Negative.   Eyes: Negative.   Respiratory: Negative.   Cardiovascular: Negative.   Gastrointestinal: Negative.   Musculoskeletal: Negative.   Skin: Negative.   Neurological: Negative.   Psychiatric/Behavioral: Positive for depression. Negative for hallucinations, memory loss, substance abuse and suicidal ideas. The patient is not nervous/anxious and does not have insomnia.     Blood pressure 116/65, pulse (!) 53, temperature (!) 97.5 F (36.4 C), temperature source Oral, resp. rate 18, height 5\' 9"  (1.753 m), weight 129.7 kg, SpO2 99 %.Body mass index is 42.23 kg/m.  General Appearance: Casual  Eye Contact:  Fair  Speech:  Slow  Volume:  Decreased  Mood:  Euthymic  Affect:  Constricted  Thought Process:  Coherent  Orientation:  Full (Time, Place, and Person)  Thought Content:  Logical  Suicidal Thoughts:  No  Homicidal Thoughts:  No  Memory:  Immediate;   Fair Recent;   Fair Remote;   Fair  Judgement:  Impaired  Insight:  Shallow  Psychomotor Activity:  Decreased  Concentration:  Concentration: Fair  Recall:  [employer] of Knowledge:  Fair  Language:  Fair  Akathisia:  No  Handed:  Right  AIMS (if indicated):     Assets:  Desire for Improvement  ADL's:  Impaired  Cognition:  WNL  Sleep:  Number of Hours: 6.5     Treatment Plan Summary: Daily contact with patient to assess and evaluate symptoms and progress in  treatment, Medication management and Plan No change to psychiatric medicine.  We are at something of an impasse and trying to find a good discharge plan for him.  Continue to encourage activity getting up going outside being interactive with others.  Alethia Berthold, MD 02/18/2019, 3:11 PM

## 2019-02-18 NOTE — Plan of Care (Signed)
Voice no concerns around  wake , sleep cycle . Patient continue to work on  coping and   decision making   allowing finger stick  compliant with remainder  of medication and knowledgeable  . Patient looking at resources for discharge.  Patient able to alert staff for any concerns . Attending unit programing    Problem: Education: Goal: Ability to make informed decisions regarding treatment will improve Outcome: Progressing   Problem: Coping: Goal: Coping ability will improve Outcome: Progressing   Problem: Health Behavior/Discharge Planning: Goal: Identification of resources available to assist in meeting health care needs will improve Outcome: Progressing   Problem: Medication: Goal: Compliance with prescribed medication regimen will improve Outcome: Progressing   Problem: Self-Concept: Goal: Ability to disclose and discuss suicidal ideas will improve Outcome: Progressing Goal: Will verbalize positive feelings about self Outcome: Progressing   Problem: Coping: Goal: Coping ability will improve Outcome: Progressing Goal: Will verbalize feelings Outcome: Progressing   Problem: Health Behavior/Discharge Planning: Goal: Ability to make decisions will improve Outcome: Progressing Goal: Compliance with therapeutic regimen will improve Outcome: Progressing   Problem: Role Relationship: Goal: Will demonstrate positive changes in social behaviors and relationships Outcome: Progressing   Problem: Safety: Goal: Ability to disclose and discuss suicidal ideas will improve Outcome: Progressing Goal: Ability to identify and utilize support systems that promote safety will improve Outcome: Progressing   Problem: Self-Concept: Goal: Will verbalize positive feelings about self Outcome: Progressing Goal: Level of anxiety will decrease Outcome: Progressing   Problem: Health Behavior/Discharge Planning: Goal: Identification of resources available to assist in meeting health care  needs will improve Outcome: Progressing

## 2019-02-18 NOTE — BHH Group Notes (Signed)
Feelings Around Diagnosis 02/18/2019 1PM  Type of Therapy/Topic:  Group Therapy:  Feelings about Diagnosis  Participation Level:  None   Description of Group:   This group will allow patients to explore their thoughts and feelings about diagnoses they have received. Patients will be guided to explore their level of understanding and acceptance of these diagnoses. Facilitator will encourage patients to process their thoughts and feelings about the reactions of others to their diagnosis and will guide patients in identifying ways to discuss their diagnosis with significant others in their lives. This group will be process-oriented, with patients participating in exploration of their own experiences, giving and receiving support, and processing challenge from other group members.   Therapeutic Goals: 1. Patient will demonstrate understanding of diagnosis as evidenced by identifying two or more symptoms of the disorder 2. Patient will be able to express two feelings regarding the diagnosis 3. Patient will demonstrate their ability to communicate their needs through discussion and/or role play  Summary of Patient Progress:  No input provided, sat quietly during session.     Therapeutic Modalities:   Cognitive Behavioral Therapy Brief Therapy Feelings Identification    Yvette Rack, LCSW 02/18/2019 1:46 PM

## 2019-02-18 NOTE — Progress Notes (Signed)
Recreation Therapy Notes   Date: 02/18/2019  Time: 9:30 am   Location: Craft room  Behavioral response: Appropriate   Intervention Topic: Coping-Skills  Discussion/Intervention:  Group content on today was focused on coping skills. The group defined what coping skills are and when they can be used. Individuals described how they normally cope with thing and the coping skills they normally use. Patients expressed why it is important to cope with things and how not coping with things can affect you. The group participated in the intervention "Exploring coping skills" where they had a chance to test new coping skills they could use in the future.   Clinical Observations/Feedback:  Patient came to group late due to unknown reasons. He was not engaged in the intervention during group.   Bradley Griffin LRT/CTRS         Dann Ventress 02/18/2019 10:53 AM

## 2019-02-19 LAB — GLUCOSE, CAPILLARY
Glucose-Capillary: 97 mg/dL (ref 70–99)
Glucose-Capillary: 119 mg/dL — ABNORMAL HIGH (ref 70–99)
Glucose-Capillary: 103 mg/dL — ABNORMAL HIGH (ref 70–99)

## 2019-02-19 MED ORDER — METFORMIN HCL 1000 MG PO TABS: 1000.0000 mg | ORAL_TABLET | Freq: Two times a day (BID) | ORAL | 0 refills | Status: DC

## 2019-02-19 MED ORDER — TRAZODONE HCL 50 MG PO TABS: 50.0000 mg | ORAL_TABLET | Freq: Every evening | ORAL | 0 refills | Status: DC | PRN

## 2019-02-19 MED ORDER — CITALOPRAM HYDROBROMIDE 20 MG PO TABS: 20.0000 mg | ORAL_TABLET | Freq: Every day | ORAL | 0 refills | Status: DC

## 2019-02-19 NOTE — Plan of Care (Signed)
  Problem: Self-Concept: Goal: Ability to disclose and discuss suicidal ideas will improve Outcome: Progressing  Patient denies suicidal ideations.

## 2019-02-19 NOTE — Progress Notes (Signed)
Patient alert and oriented x 4, affect is blunted, thoughts are organized and coherent no distress noted he denies SI/HI/AVH, interacting with peers and staff appropriately, heappears less anxious, not  was argumentative with staff, and receptive. Patient refused night time PRN sleep aide, he attended evening wrap up group, 15 minutes safety checks maintained will continue to monitor

## 2019-02-19 NOTE — Progress Notes (Signed)
Recreation Therapy Notes  Date: 02/19/2019  Time: 9:30 am   Location: Craft room  Behavioral response: Not-Engaged   Intervention Topic: Self-care  Discussion/Intervention:  Group content today was focused on Self-Care. The group defined self-care and some positive ways they care for themselves. Individuals expressed ways and reasons why they neglected any self-care in the past. Patients described ways to improve self-care in the future. The group explained what could happen if they did not do any self-care activities at all. The group participated in the intervention "self-care assessment" where they had a chance to discover some of their weaknesses and strengths in self- care. Patient came up with a self-care plan to improve themselves in the future.  Clinical Observations/Feedback:  Patient came to group and did not provide any contributions towards the group discussion. He was not engaged in the intervention and left group early.  Ilse Billman LRT/CTRS         Crayton Savarese 02/19/2019 11:41 AM

## 2019-02-19 NOTE — Progress Notes (Signed)
CSW called [employer]. They reported they are still accommodating men at this time and that pt would need to come between 12pm and 5pm to do an intake to determine what kind of job he will have while there, but there are jobs available that allow you to sit and not stand the entire time. CSW notified Dr. Weber Cooks. CSW provided pt with information on [employer].  Evalina Field, MSW, LCSW Clinical Social Work 02/19/2019 10:45 AM

## 2019-02-19 NOTE — Progress Notes (Signed)
[facility] MD Progress Note  02/19/2019 5:25 PM Bradley Griffin  MRN:  [id number] Subjective: Patient seen for follow-up.  Patient has no new complaints.  Mood has been stable.  Nervous at times over realistic concerns.  Denies any suicidal thoughts.  Feels tired a lot but no other new physical issues.  Treatment team has been working on a discharge plan to the rescue mission which may be something we can implement as early as tomorrow Principal Problem: Bipolar 2 disorder ([place]) Diagnosis: Principal Problem:   Bipolar 2 disorder ([place]) Active Problems:   Major depressive disorder, recurrent severe without psychotic features ([place])   Borderline personality disorder ([place])   Basal cell carcinoma   Diabetes ([place])  Total Time spent with patient: 20 minutes  Past Psychiatric History: Past history of bipolar versus anxiety and personality history of mood instability  Past Medical History:  Past Medical History:  Diagnosis Date  . Blindness of left eye   . Cancer [facility])    Skin cancer     Past Surgical History:  Procedure Laterality Date  . CHOLECYSTECTOMY     Family History: History reviewed. No pertinent family history. Family Psychiatric  History: See previous Social History:  Social History   Substance and Sexual Activity  Alcohol Use Never  . Frequency: Never     Social History   Substance and Sexual Activity  Drug Use Never    Social History   Socioeconomic History  . Marital status: Single    Spouse name: Not on file  . Number of children: Not on file  . Years of education: Not on file  . Highest education level: Not on file  Occupational History  . Not on file  Social Needs  . Financial resource strain: Not on file  . Food insecurity    Worry: Not on file    Inability: Not on file  . Transportation needs    Medical: Not on file    Non-medical: Not on file  Tobacco Use  . Smoking status: Never Smoker  . Smokeless tobacco: Never Used  Substance and Sexual Activity   . Alcohol use: Never    Frequency: Never  . Drug use: Never  . Sexual activity: Not Currently  Lifestyle  . Physical activity    Days per week: Not on file    Minutes per session: Not on file  . Stress: Not on file  Relationships  . Social Herbalist on phone: Not on file    Gets together: Not on file    Attends religious service: Not on file    Active member of club or organization: Not on file    Attends meetings of clubs or organizations: Not on file    Relationship status: Not on file  Other Topics Concern  . Not on file  Social History Narrative  . Not on file   Additional Social History:                         Sleep: Fair  Appetite:  Fair  Current Medications: Current Facility-Administered Medications  Medication Dose Route Frequency Provider Last Rate Last Dose  . acetaminophen (TYLENOL) tablet 650 mg  650 mg Oral Q6H PRN Patrecia Pour, NP      . alum & mag hydroxide-simeth (MAALOX/MYLANTA) 200-200-20 MG/5ML suspension 30 mL  30 mL Oral Q4H PRN Patrecia Pour, NP      . citalopram (CELEXA) tablet 20 mg  20 mg Oral Daily Sharma Covert, MD   20 mg at 02/19/19 0749  . influenza vaccine adjuvanted (FLUAD) injection 0.5 mL  0.5 mL Intramuscular Tomorrow-1000 Abdurrahman Petersheim T, MD      . magnesium hydroxide (MILK OF MAGNESIA) suspension 30 mL  30 mL Oral Daily PRN Patrecia Pour, NP      . metFORMIN (GLUCOPHAGE) tablet 1,000 mg  1,000 mg Oral BID WC Patrecia Pour, NP   1,000 mg at 02/19/19 1630  . pneumococcal 23 valent vaccine (PNU-IMMUNE) injection 0.5 mL  0.5 mL Intramuscular Tomorrow-1000 Kemper Hochman T, MD      . traZODone (DESYREL) tablet 50 mg  50 mg Oral QHS PRN Sharma Covert, MD        Lab Results:  Results for orders placed or performed during the hospital encounter of 02/08/19 (from the past 48 hour(s))  Glucose, capillary     Status: Abnormal   Collection Time: 02/17/19  8:37 PM  Result Value Ref Range    Glucose-Capillary 118 (H) 70 - 99 mg/dL  Glucose, capillary     Status: Abnormal   Collection Time: 02/18/19  6:55 AM  Result Value Ref Range   Glucose-Capillary 152 (H) 70 - 99 mg/dL   Comment 1 Notify RN   Glucose, capillary     Status: Abnormal   Collection Time: 02/18/19 11:47 AM  Result Value Ref Range   Glucose-Capillary 115 (H) 70 - 99 mg/dL  Glucose, capillary     Status: Abnormal   Collection Time: 02/18/19  4:19 PM  Result Value Ref Range   Glucose-Capillary 107 (H) 70 - 99 mg/dL   Comment 1 Notify RN   Glucose, capillary     Status: None   Collection Time: 02/18/19  9:10 PM  Result Value Ref Range   Glucose-Capillary 97 70 - 99 mg/dL   Comment 1 Notify RN   Glucose, capillary     Status: Abnormal   Collection Time: 02/19/19  7:01 AM  Result Value Ref Range   Glucose-Capillary 119 (H) 70 - 99 mg/dL  Glucose, capillary     Status: Abnormal   Collection Time: 02/19/19 11:27 AM  Result Value Ref Range   Glucose-Capillary 103 (H) 70 - 99 mg/dL   Comment 1 Notify RN     Blood Alcohol level:  No results found for: [facility]  Metabolic Disorder Labs: Lab Results  Component Value Date   HGBA1C 7.3 (H) 02/07/2019   MPG 163 02/07/2019   MPG 260.39 09/19/2018   No results found for: PROLACTIN No results found for: CHOL, TRIG, HDL, CHOLHDL, VLDL, LDLCALC  Physical Findings: AIMS: Facial and Oral Movements Muscles of Facial Expression: None, normal Lips and Perioral Area: None, normal Jaw: None, normal Tongue: None, normal,Extremity Movements Upper (arms, wrists, hands, fingers): None, normal Lower (legs, knees, ankles, toes): None, normal, Trunk Movements Neck, shoulders, hips: None, normal, Overall Severity Severity of abnormal movements (highest score from questions above): None, normal Incapacitation due to abnormal movements: None, normal Patient's awareness of abnormal movements (rate only patient's report): No Awareness, Dental Status Current problems with teeth  and/or dentures?: No Does patient usually wear dentures?: No  CIWA:  CIWA-Ar Total: 2 COWS:  COWS Total Score: 0  Musculoskeletal: Strength & Muscle Tone: within normal limits Gait & Station: normal Patient leans: N/A  Psychiatric Specialty Exam: Physical Exam  Nursing note and vitals reviewed. Constitutional: He appears well-developed and well-nourished.  HENT:  Head: Normocephalic and atraumatic.  Eyes:  Pupils are equal, round, and reactive to light. Conjunctivae are normal.  Neck: Normal range of motion.  Cardiovascular: Regular rhythm and normal heart sounds.  Respiratory: Effort normal. No respiratory distress.  GI: Soft.  Musculoskeletal: Normal range of motion.  Neurological: He is alert.  Skin: Skin is warm and dry.  Psychiatric: Judgment normal. His affect is blunt. His speech is delayed. He is slowed. Thought content is not paranoid. Cognition and memory are normal. He expresses no homicidal and no suicidal ideation.    Review of Systems  Constitutional: Negative.   HENT: Negative.   Eyes: Negative.   Respiratory: Negative.   Cardiovascular: Negative.   Gastrointestinal: Negative.   Musculoskeletal: Negative.   Skin: Negative.   Neurological: Negative.   Psychiatric/Behavioral: Negative for depression, hallucinations, memory loss, substance abuse and suicidal ideas. The patient is nervous/anxious. The patient does not have insomnia.     Blood pressure 119/62, pulse (!) 53, temperature 97.8 F (36.6 C), temperature source Oral, resp. rate 17, height 5\' 9"  (1.753 m), weight 129.7 kg, SpO2 98 %.Body mass index is 42.23 kg/m.  General Appearance: Casual  Eye Contact:  Fair  Speech:  Clear and Coherent  Volume:  Normal  Mood:  Euthymic  Affect:  Blunt  Thought Process:  Goal Directed  Orientation:  Full (Time, Place, and Person)  Thought Content:  Logical  Suicidal Thoughts:  No  Homicidal Thoughts:  No  Memory:  Immediate;   Fair Recent;   Fair Remote;    Fair  Judgement:  Fair  Insight:  Fair  Psychomotor Activity:  Decreased  Concentration:  Concentration: Fair  Recall:  [employer] of Knowledge:  Fair  Language:  Fair  Akathisia:  No  Handed:  Right  AIMS (if indicated):     Assets:  Desire for Improvement  ADL's:  Impaired  Cognition:  Impaired,  Mild  Sleep:  Number of Hours: 6.5     Treatment Plan Summary: Daily contact with patient to assess and evaluate symptoms and progress in treatment, Medication management and Plan Patient is stable tolerating medications well.  Probably at his baseline.  No evidence of acute suicidality.  We have tentatively made arrangements for him to be able to go [facility].  Reviewed plan with patient.  He will be referred for outpatient treatment locally.  We will go ahead and try and get prescriptions ready.  Alethia Berthold, MD 02/19/2019, 5:25 PM

## 2019-02-19 NOTE — Plan of Care (Signed)
D: Pt during assessments denies SI/HI/AVH, able to contract for safety. Pt is pleasant and cooperative, engagement is fair. Pt. has no complaints. Patient interaction fair. Pt. Endorses a mostly normal mood.   A: Q x 15 minute observation checks in place for safety. Patient was and is provided with education throughout shift.  Patient was and will be given/offered medications per orders. Patient was and is encouraged to attend groups, participate in unit activities and continue with plan of care. Pt. Chart and plans of care reviewed. Pt. Given support and encouragement.   R: Patient is complaint with medication and unit procedures. Pt. Observed eating good. Pt. Sugars monitored for safety. Pt. Encouraged to utilize high falls safety interventions. Pt. Observed out in the milieu frequently.    Problem: Health Behavior/Discharge Planning: Goal: Compliance with therapeutic regimen will improve Outcome: Progressing   Problem: Self-Concept: Goal: Ability to disclose and discuss suicidal ideas will improve Outcome: Progressing   Problem: Medication: Goal: Compliance with prescribed medication regimen will improve Outcome: Progressing   Problem: Coping: Goal: Coping ability will improve Outcome: Progressing

## 2019-02-19 NOTE — BHH Group Notes (Signed)
LCSW Group Therapy Note  02/19/2019 11:44 AM  Type of Therapy/Topic:  Group Therapy:  Emotion Regulation  Participation Level:  None   Description of Group:   The purpose of this group is to assist patients in learning to regulate negative emotions and experience positive emotions. Patients will be guided to discuss ways in which they have been vulnerable to their negative emotions. These vulnerabilities will be juxtaposed with experiences of positive emotions or situations, and patients will be challenged to use positive emotions to combat negative ones. Special emphasis will be placed on coping with negative emotions in conflict situations, and patients will process healthy conflict resolution skills.  Therapeutic Goals: 1. Patient will identify two positive emotions or experiences to reflect on in order to balance out negative emotions 2. Patient will label two or more emotions that they find the most difficult to experience 3. Patient will demonstrate positive conflict resolution skills through discussion and/or role plays  Summary of Patient Progress: Pt came at the end of the group, did not participate because discussion was over.   Therapeutic Modalities:   Cognitive Behavioral Therapy Feelings Identification Dialectical Behavioral Therapy   Evalina Field, MSW, LCSW Clinical Social Work 02/19/2019 11:44 AM

## 2019-02-19 NOTE — Plan of Care (Signed)
  Problem: Coping: Goal: Will verbalize feelings Outcome: Progressing  Patient verbalized feelings and frustrations to staff Problem: Health Behavior/Discharge Planning: Goal: Compliance with therapeutic regimen will improve Outcome: Progressing  Patient complaint with medication regimen.

## 2019-02-19 NOTE — BHH Counselor (Signed)
CSW met with the patient and reminded to check in with [facility] in regards to his car.   CSW also provided information on [employer].  Patient was hesitant about the shelter being located in [place] but did not say he was opposed.  CSW reviewed with the patient the 30 day eviction process with patient and encouraged patient to check to see if the process has begun.    Patient did confirm that he has an ID.  Assunta Curtis, MSW, LCSW 02/19/2019 10:43 AM

## 2019-02-19 NOTE — Progress Notes (Signed)
Pt. Reports he does not want to do his dinner blood sugar check stating, "oh it's fine, I'm leaving tomorrow anyway". Pt. Educated he should continue to check his sugars upon discharge for safety and control and while on the unit per MD orders. Pt. Not agreeable to this.

## 2019-02-20 MED ORDER — TRAZODONE HCL 50 MG PO TABS: 50.0000 mg | ORAL_TABLET | Freq: Every evening | ORAL | 1 refills | Status: DC | PRN

## 2019-02-20 MED ORDER — METFORMIN HCL 1000 MG PO TABS: 1000.0000 mg | ORAL_TABLET | Freq: Two times a day (BID) | ORAL | 1 refills | Status: DC

## 2019-02-20 MED ORDER — CITALOPRAM HYDROBROMIDE 20 MG PO TABS: 20.0000 mg | ORAL_TABLET | Freq: Every day | ORAL | 1 refills | Status: DC

## 2019-02-20 NOTE — Discharge Summary (Signed)
Physician Discharge Summary Note  Patient:  Bradley Griffin is an 68 y.o., male MRN:  [id number] DOB:  1950/09/13 Patient phone:  [phone] (home)  Patient address:   [address] [place] [address],  Total Time spent with patient: 45 minutes  Date of Admission:  02/08/2019 Date of Discharge: February 20, 2019  Reason for Admission: [place]  Patient endorsed suicidal ideation and endorsed a sense of hopelessness.  He has had multiple profound stresses in his life and appears to no longer have a safe place to stay.  Patient had multiple symptoms of depression.  Principal Problem: Bipolar 2 disorder [facility]) Discharge Diagnoses: Principal Problem:   Bipolar 2 disorder ([place]) Active Problems:   Major depressive disorder, recurrent severe without psychotic features ([place])   Borderline personality disorder ([place])   Basal cell carcinoma   Diabetes ([place])   Past Psychiatric History: Patient has a history of psychiatric illness with diagnoses variously of bipolar of depression of borderline personality disorder  Past Medical History:  Past Medical History:  Diagnosis Date  . Blindness of left eye   . Cancer [facility])    Skin cancer     Past Surgical History:  Procedure Laterality Date  . CHOLECYSTECTOMY     Family History: History reviewed. No pertinent family history. Family Psychiatric  History: See previous Social History:  Social History   Substance and Sexual Activity  Alcohol Use Never  . Frequency: Never     Social History   Substance and Sexual Activity  Drug Use Never    Social History   Socioeconomic History  . Marital status: Single    Spouse name: Not on file  . Number of children: Not on file  . Years of education: Not on file  . Highest education level: Not on file  Occupational History  . Not on file  Social Needs  . Financial resource  strain: Not on file  . Food insecurity    Worry: Not on file    Inability: Not on file  . Transportation needs    Medical: Not on file    Non-medical: Not on file  Tobacco Use  . Smoking status: Never Smoker  . Smokeless tobacco: Never Used  Substance and Sexual Activity  . Alcohol use: Never    Frequency: Never  . Drug use: Never  . Sexual activity: Not Currently  Lifestyle  . Physical activity    Days per week: Not on file    Minutes per session: Not on file  . Stress: Not on file  Relationships  . Social Herbalist on phone: Not on file    Gets together: Not on file    Attends religious service: Not on file    Active member of club or organization: Not on file    Attends meetings of clubs or organizations: Not on file    Relationship status: Not on file  Other Topics Concern  . Not on file  Social History Narrative  . Not on file    Hospital Course: Admitted to psychiatric unit.  15-minute checks for safety.  Patient did not display any dangerous aggressive violent or suicidal behavior.  He was cooperative with treatment.  1 exception was that initially he declined having his blood sugars checked until it was explained to him that his hemoglobin A1c was elevated and his blood sugars really  were high and it suggested that he had developed diabetes.  At that point he became compliant.  Patient was compliant with antidepressant medication.  He attended groups at times.  He was open and conversant with treatment staff.  Patient continued to feel down and somewhat hopeless but was no longer expressing any suicidal ideation by the time of discharge.  We managed to discover that his car was still parked in the same place he had left hip.  We have managed to get $20 contributed from the hospital so he will have gas for it.  Patient was given options for discharge and chose to try going to [place] to the rescue mission because he feels that his current apartment is no longer safe  and probably no longer available for him.  He has been given follow-up arrangements at [facility] in [place].  Patient has consistently denied any suicidal ideation.  No evidence of psychosis.  Lots of counseling and support given and encouragement to work on keeping himself stable.  Apparently in January he will be eligible to apply for Social Security and will have a source of some income that should make a real improvement in his options.  Physical Findings: AIMS: Facial and Oral Movements Muscles of Facial Expression: None, normal Lips and Perioral Area: None, normal Jaw: None, normal Tongue: None, normal,Extremity Movements Upper (arms, wrists, hands, fingers): None, normal Lower (legs, knees, ankles, toes): None, normal, Trunk Movements Neck, shoulders, hips: None, normal, Overall Severity Severity of abnormal movements (highest score from questions above): None, normal Incapacitation due to abnormal movements: None, normal Patient's awareness of abnormal movements (rate only patient's report): No Awareness, Dental Status Current problems with teeth and/or dentures?: No Does patient usually wear dentures?: No  CIWA:  CIWA-Ar Total: 2 COWS:  COWS Total Score: 0  Musculoskeletal: Strength & Muscle Tone: within normal limits Gait & Station: normal Patient leans: N/A  Psychiatric Specialty Exam: Physical Exam  Nursing note and vitals reviewed. Constitutional: He appears well-developed and well-nourished.  HENT:  Head: Normocephalic and atraumatic.  Eyes: Pupils are equal, round, and reactive to light. Conjunctivae are normal.  Neck: Normal range of motion.  Cardiovascular: Regular rhythm and normal heart sounds.  Respiratory: Effort normal. No respiratory distress.  GI: Soft.  Musculoskeletal: Normal range of motion.  Neurological: He is alert.  Skin: Skin is warm and dry.  Psychiatric: He has a normal mood and affect. His speech is normal and behavior is normal. Judgment and  thought content normal. His affect is not blunt. Cognition and memory are normal.    Review of Systems  Constitutional: Negative.   HENT: Negative.   Eyes: Negative.   Respiratory: Negative.   Cardiovascular: Negative.   Gastrointestinal: Negative.   Musculoskeletal: Negative.   Skin: Negative.   Neurological: Negative.   Psychiatric/Behavioral: Negative.     Blood pressure 117/65, pulse (!) 54, temperature (!) 97.4 F (36.3 C), temperature source Oral, resp. rate 18, height 5\' 9"  (1.753 m), weight 129.7 kg, SpO2 99 %.Body mass index is 42.23 kg/m.  General Appearance: Casual  Eye Contact:  Fair  Speech:  Slow  Volume:  Decreased  Mood:  Euthymic  Affect:  Congruent  Thought Process:  Goal Directed  Orientation:  Full (Time, Place, and Person)  Thought Content:  Logical  Suicidal Thoughts:  No  Homicidal Thoughts:  No  Memory:  Immediate;   Fair Recent;   Fair Remote;   Fair  Judgement:  Fair  Insight:  Fair  Psychomotor Activity:  Decreased  Concentration:  Concentration: Fair  Recall:  [employer] of Knowledge:  Fair  Language:  Fair  Akathisia:  No  Handed:  Right  AIMS (if indicated):     Assets:  Desire for Improvement Resilience  ADL's:  Intact  Cognition:  WNL  Sleep:  Number of Hours: 8        Has this patient used any form of tobacco in the last 30 days? (Cigarettes, Smokeless Tobacco, Cigars, and/or Pipes) Yes, Yes, A prescription for an FDA-approved tobacco cessation medication was offered at discharge and the patient refused  Blood Alcohol level:  No results found for: [facility]  Metabolic Disorder Labs:  Lab Results  Component Value Date   HGBA1C 7.3 (H) 02/07/2019   MPG 163 02/07/2019   MPG 260.39 09/19/2018   No results found for: PROLACTIN No results found for: CHOL, TRIG, HDL, CHOLHDL, VLDL, LDLCALC  See Psychiatric Specialty Exam and Suicide Risk Assessment completed by Attending Physician prior to discharge.  Discharge destination:   Other:  [facility] [place] is the recommended and agreed upon discharge destination  Is patient on multiple antipsychotic therapies at discharge:  No   Has Patient had three or more failed trials of antipsychotic monotherapy by history:  No  Recommended Plan for Multiple Antipsychotic Therapies: NA  Discharge Instructions    Diet - low sodium heart healthy   Complete by: As directed    Increase activity slowly   Complete by: As directed      Allergies as of 02/20/2019   No Known Allergies     Medication List    TAKE these medications     Indication  citalopram 20 MG tablet Commonly known as: CELEXA Take 1 tablet (20 mg total) by mouth daily.  Indication: Depression, Generalized Anxiety Disorder   metFORMIN 1000 MG tablet Commonly known as: GLUCOPHAGE Take 1 tablet (1,000 mg total) by mouth 2 (two) times daily with a meal.  Indication: Type 2 Diabetes   traZODone 50 MG tablet Commonly known as: DESYREL Take 1 tablet (50 mg total) by mouth at bedtime as needed for sleep.  Indication: [facility]. Go to.   Why: You must attend walk in hours to set up services. Walk-in hours are Monday - Friday at 8:00AM. If available, please bring a photo ID, social security card, insurance card, list of current meds, and hospital discharge paperwork. Thank you! Contact information: [address], [place] [address] Phone: [phone] Fax: [phone]          Follow-up recommendations:  Activity:  Activity as tolerated Diet:  Regular diet Other:  Follow-up with outpatient treatment as recommended.  Prescriptions and 7-day supply of medicines given at discharge  Comments: Prescriptions provided  Signed: Alethia Berthold, MD 02/20/2019, 11:29 AM

## 2019-02-20 NOTE — Progress Notes (Signed)
D: Patient is aware of  Discharge this shift .  A:Patient denies suicidal /homicidal ideations. Patient received all belongings brought in. No Storage medications. Writer reviewed Discharge Summary, Suicide Risk Assessment, and Transitional Record. Patient also received Prescriptions   from  MD. A 10 day supply of medications given to patient . Aware  Of follow up appointment . To [facility]   R: Patient left unit with no questions  Or concerns  With taxi to his car

## 2019-02-20 NOTE — Plan of Care (Signed)
  Problem: Education: Goal: Ability to make informed decisions regarding treatment will improve Outcome: Adequate for Discharge   Problem: Coping: Goal: Coping ability will improve Outcome: Adequate for Discharge   Problem: Health Behavior/Discharge Planning: Goal: Identification of resources available to assist in meeting health care needs will improve Outcome: Adequate for Discharge   Problem: Medication: Goal: Compliance with prescribed medication regimen will improve Outcome: Adequate for Discharge   Problem: Self-Concept: Goal: Ability to disclose and discuss suicidal ideas will improve Outcome: Adequate for Discharge Goal: Will verbalize positive feelings about self Outcome: Adequate for Discharge

## 2019-02-20 NOTE — Progress Notes (Signed)
Recreation Therapy Notes   Date: 02/20/2019  Time: 9:30 am    Location: Craft room   Behavioral response: N/A   Intervention Topic: Communication   Discussion/Intervention: Patient did not attend group.   Clinical Observations/Feedback:  Patient did not attend group.   Parveen Freehling LRT/CTRS        Nell Schrack 02/20/2019 12:09 PM

## 2019-02-20 NOTE — BHH Suicide Risk Assessment (Signed)
[facility] Discharge Suicide Risk Assessment   Principal Problem: Bipolar 2 disorder [place] [facility]) Discharge Diagnoses: Principal Problem:   Bipolar 2 disorder ([place]) Active Problems:   Major depressive disorder, recurrent severe without psychotic features ([place])   Borderline personality disorder ([place])   Basal cell carcinoma   Diabetes ([place])   Total Time spent with patient: 45 minutes  Musculoskeletal: Strength & Muscle Tone: within normal limits Gait & Station: normal Patient leans: N/A  Psychiatric Specialty Exam: Review of Systems  Constitutional: Negative.   HENT: Negative.   Eyes: Negative.   Respiratory: Negative.   Cardiovascular: Negative.   Gastrointestinal: Negative.   Musculoskeletal: Negative.   Skin: Negative.   Neurological: Negative.   Psychiatric/Behavioral: Negative.     Blood pressure 117/65, pulse (!) 54, temperature (!) 97.4 F (36.3 C), temperature source Oral, resp. rate 18, height 5\' 9"  (1.753 m), weight 129.7 kg, SpO2 99 %.Body mass index is 42.23 kg/m.  General Appearance: Casual  Eye Contact::  Fair  Speech:  Slow409  Volume:  Decreased  Mood:  Euthymic  Affect:  Constricted  Thought Process:  Coherent  Orientation:  Full (Time, Place, and Person)  Thought Content:  Logical  Suicidal Thoughts:  No  Homicidal Thoughts:  No  Memory:  Immediate;   Fair Recent;   Fair Remote;   Fair  Judgement:  Fair  Insight:  Fair  Psychomotor Activity:  Decreased  Concentration:  Fair  Recall:  [employer] of Knowledge:Fair  Language: Fair  Akathisia:  No  Handed:  Right  AIMS (if indicated):     Assets:  Desire for Improvement Resilience  Sleep:  Number of Hours: 8  Cognition: WNL  ADL's:  Intact   Mental Status Per Nursing Assessment::   On Admission:  Suicide plan  Demographic Factors:  Male, Caucasian, Low socioeconomic status and Unemployed  Loss Factors: Financial problems/change in socioeconomic status  Historical Factors: NA  Risk Reduction  Factors:   Living with another person, especially a relative  Continued Clinical Symptoms:  Depression:   Hopelessness  Cognitive Features That Contribute To Risk:  None    Suicide Risk:  Minimal: No identifiable suicidal ideation.  Patients presenting with no risk factors but with morbid ruminations; may be classified as minimal risk based on the severity of the depressive symptoms  Follow-up [facility]. Go to.   Why: You must attend walk in hours to set up services. Walk-in hours are Monday - Friday at 8:00AM. If available, please bring a photo ID, social security card, insurance card, list of current meds, and hospital discharge paperwork. Thank you! Contact information: [place], [place] [address] Phone: [phone] Fax: [phone]          Plan Of Care/Follow-up recommendations:  Activity:  Activity as tolerated Diet:  Diabetic low-carb diet Other:  Follow-up with outpatient treatment through mental health services in [place] as indicated.  Alethia Berthold, MD 02/20/2019, 11:23 AM

## 2019-02-20 NOTE — Plan of Care (Signed)
  Problem: Group Participation Goal: STG - Patient will engage in groups without prompting or encouragement from LRT x3 group sessions within 5 recreation therapy group sessions Description: STG - Patient will engage in groups without prompting or encouragement from LRT x3 group sessions within 5 recreation therapy group sessions 02/20/2019 1353 by Ernest Haber, LRT Outcome: Adequate for Discharge 02/20/2019 1353 by Ernest Haber, LRT Outcome: Adequate for Discharge

## 2019-02-20 NOTE — Progress Notes (Signed)
Patient is alert and oriented x 4, affect is flat but he brightens upon approach, his thoughts are organized and coherent he is visible in the milieu interacting appropriately with peers and staff, no distress noted, 15 minutes safety checks maintained will continue to monitor.

## 2019-02-20 NOTE — Progress Notes (Signed)
  [facility] Adult Case Management Discharge Plan :  Will you be returning to the same living situation after discharge:  No. At discharge, do you have transportation home?: Yes,  pt will be provided a taxi to his car Do you have the ability to pay for your medications: Yes,  mental health  Release of information consent forms completed and in the chart;    Patient to Follow up at: Follow-up [facility]. Go to.   Why: You must attend walk in hours to set up services. Walk-in hours are Monday - Friday at 8:00AM. If available, please bring a photo ID, social security card, insurance card, list of current meds, and hospital discharge paperwork. Thank you! Contact information: [place], [place] [address] Phone: [phone] Fax: [phone]          Next level of care provider has access to [place] and Suicide Prevention discussed: Yes,  SPE completed with pt as pt declined collateral contact     Has patient been referred to the Quitline?: N/A patient is not a smoker  Patient has been referred for addiction treatment: [place], LCSW 02/20/2019, 11:09 AM

## 2019-02-20 NOTE — Progress Notes (Signed)
CSW got $20 petty cash for pt to use for gas to get to [employer]. Money was sealed in an envelope and given to attend RN.  Evalina Field, MSW, LCSW Clinical Social Work 02/20/2019 11:06 AM

## 2019-02-20 NOTE — BHH Group Notes (Signed)
Balance In Life 02/20/2019 1PM  Type of Therapy/Topic:  Group Therapy:  Balance in Life  Participation Level:  Did Not Attend  Description of Group:   This group will address the concept of balance and how it feels and looks when one is unbalanced. Patients will be encouraged to process areas in their lives that are out of balance and identify reasons for remaining unbalanced. Facilitators will guide patients in utilizing problem-solving interventions to address and correct the stressor making their life unbalanced. Understanding and applying boundaries will be explored and addressed for obtaining and maintaining a balanced life. Patients will be encouraged to explore ways to assertively make their unbalanced needs known to significant others in their lives, using other group members and facilitator for support and feedback.  Therapeutic Goals: 1. Patient will identify two or more emotions or situations they have that consume much of in their lives. 2. Patient will identify signs/triggers that life has become out of balance:  3. Patient will identify two ways to set boundaries in order to achieve balance in their lives:  4. Patient will demonstrate ability to communicate their needs through discussion and/or role plays  Summary of Patient Progress:    Therapeutic Modalities:   Cognitive Behavioral Therapy Solution-Focused Therapy Assertiveness Training  Aniesa Boback T Willy Vorce, LCSW  

## 2019-02-20 NOTE — Progress Notes (Signed)
Recreation Therapy Notes  INPATIENT RECREATION TR PLAN  Patient Details Name: Bradley Griffin MRN: [id number] DOB: [birth year] Today's Date: 02/20/2019  Rec Therapy Plan Is patient appropriate for Therapeutic Recreation?: Yes Treatment times per week: at least 3 Estimated Length of Stay: 5-7 days TR Treatment/Interventions: Group participation (Comment)  Discharge Criteria Pt will be discharged from therapy if:: Discharged Treatment plan/goals/alternatives discussed and agreed upon by:: Patient/family  Discharge Summary Short term goals set: Patient will engage in groups without prompting or encouragement from LRT x3 group sessions within 5 recreation therapy group sessions Short term goals met: Adequate for discharge Progress toward goals comments: Groups attended Which groups?: Goal setting, Leisure education, Coping skills, Other (Comment)(Problem solving, self-care) Reason goals not met: N/A Therapeutic equipment acquired: N/A Reason patient discharged from therapy: Discharge from hospital Pt/family agrees with progress & goals achieved: Yes Date patient discharged from therapy: 02/20/19   [name] 02/20/2019, 2:17 PM

## 2019-05-07 ENCOUNTER — Other Ambulatory Visit: Payer: Self-pay

## 2019-05-07 ENCOUNTER — Encounter: Payer: Self-pay | Admitting: Emergency Medicine

## 2019-05-07 ENCOUNTER — Emergency Department
Admission: EM | Admit: 2019-05-07 | Discharge: 2019-05-07 | Disposition: A | Discharge status: Home / Self Care | Payer: Medicaid Other | Attending: Emergency Medicine | Admitting: Emergency Medicine

## 2019-05-07 DIAGNOSIS — Z20828 Contact with and (suspected) exposure to other viral communicable diseases: Secondary | ICD-10-CM | POA: Diagnosis not present

## 2019-05-07 DIAGNOSIS — L03115 Cellulitis of right lower limb: Secondary | ICD-10-CM | POA: Insufficient documentation

## 2019-05-07 DIAGNOSIS — L0390 Cellulitis, unspecified: Secondary | ICD-10-CM

## 2019-05-07 DIAGNOSIS — L03116 Cellulitis of left lower limb: Secondary | ICD-10-CM | POA: Diagnosis not present

## 2019-05-07 DIAGNOSIS — Z79899 Other long term (current) drug therapy: Secondary | ICD-10-CM | POA: Insufficient documentation

## 2019-05-07 DIAGNOSIS — E119 Type 2 diabetes mellitus without complications: Secondary | ICD-10-CM | POA: Insufficient documentation

## 2019-05-07 DIAGNOSIS — L539 Erythematous condition, unspecified: Secondary | ICD-10-CM | POA: Diagnosis present

## 2019-05-07 DIAGNOSIS — Z85828 Personal history of other malignant neoplasm of skin: Secondary | ICD-10-CM | POA: Insufficient documentation

## 2019-05-07 DIAGNOSIS — Z7984 Long term (current) use of oral hypoglycemic drugs: Secondary | ICD-10-CM | POA: Diagnosis not present

## 2019-05-07 LAB — COMPREHENSIVE METABOLIC PANEL
ALT: 18 U/L (ref 0–44)
AST: 23 U/L (ref 15–41)
Albumin: 3.2 g/dL — ABNORMAL LOW (ref 3.5–5.0)
Alkaline Phosphatase: 60 U/L (ref 38–126)
Anion gap: 11 (ref 5–15)
BUN: 11 mg/dL (ref 8–23)
CO2: 23 mmol/L (ref 22–32)
Calcium: 8.4 mg/dL — ABNORMAL LOW (ref 8.9–10.3)
Chloride: 104 mmol/L (ref 98–111)
Creatinine, Ser: 0.92 mg/dL (ref 0.61–1.24)
GFR calc Af Amer: >60 mL/min (ref >60)
GFR calc non Af Amer: >60 mL/min (ref >60)
Glucose, Bld: 141 mg/dL — ABNORMAL HIGH (ref 70–99)
Potassium: 3.4 mmol/L — ABNORMAL LOW (ref 3.5–5.1)
Sodium: 138 mmol/L (ref 135–145)
Total Bilirubin: 1.2 mg/dL (ref 0.3–1.2)
Total Protein: 6.8 g/dL (ref 6.5–8.1)

## 2019-05-07 LAB — CBC
HCT: 38.4 % — ABNORMAL LOW (ref 39.0–52.0)
Hemoglobin: 13.1 g/dL (ref 13.0–17.0)
MCH: 31.6 pg (ref 26.0–34.0)
MCHC: 34.1 g/dL (ref 30.0–36.0)
MCV: 92.8 fL (ref 80.0–100.0)
Platelets: 251 10*3/uL (ref 150–400)
RBC: 4.14 MIL/uL — ABNORMAL LOW (ref 4.22–5.81)
RDW: 14.5 % (ref 11.5–15.5)
WBC: 7.4 10*3/uL (ref 4.0–10.5)
nRBC: 0 % (ref 0.0–0.2)

## 2019-05-07 LAB — POC SARS CORONAVIRUS 2 AG: SARS Coronavirus 2 Ag: NEGATIVE

## 2019-05-07 MED ORDER — CEPHALEXIN 500 MG PO CAPS
500.0000 mg | ORAL_CAPSULE | Freq: Once | ORAL | Status: AC
  Administered 2019-05-07: 500 mg via ORAL
  Filled 2019-05-07: qty 1

## 2019-05-07 MED ORDER — CEPHALEXIN 500 MG PO CAPS: 500.0000 mg | ORAL_CAPSULE | Freq: Three times a day (TID) | ORAL | 0 refills | Status: DC

## 2019-05-07 NOTE — TOC Transition Note (Signed)
Transition of Care [facility]) - CM/SW Discharge Note   Patient Details  Name: Bradley Griffin MRN: [id number] Date of Birth: [birth year]  Transition of Care [facility]) CM/SW Contact:  Trecia Rogers, [place] Phone Number: 05/07/2019, 4:27 PM   Clinical Narrative:     CSW talked with pt's doctor who stated that the patient was ready to go. Discharge order was put in. CSW talked with [place] who stated that his insurance will hinder the patient from getting home health.  CSW contacted pt's APS worker, Bridgette Habermann, who stated that she can obtain home health through his future new PCP doctor. Bridgette Habermann stated that she is driving to the hospital to get the patient to take him back home since he is being discharged.  Patient was able to get a walker from [place] with [facility].   Final next level of care: Home/Self Care Barriers to Discharge: No Barriers Identified   Patient Goals and CMS Choice Patient states their goals for this hospitalization and ongoing recovery are:: Pt's APS worker, Bridgette Habermann stated, "to get his health back in order" CMS Medicare.gov Compare Post Acute Care list provided to:: Patient Represenative (must comment)(APS) Choice offered to / list presented to : Patient, [place] / Guardian  Discharge Placement                       Discharge Plan and Services In-house Referral: Clinical Social Work   Post Acute Care Choice: Home Health          DME Arranged: Gilford Rile DME Agency: [facility] Date DME Agency Contacted: 05/07/19 Time DME Agency Contacted: [phone] Representative spoke with at DME Agency: [place]: PT [place]: [place] ([place]) Date [place]: 05/07/19 Time [place]: [id number] Representative spoke with at [place]: [place] ([place]) Interventions     Readmission Risk Interventions No flowsheet data found.

## 2019-05-07 NOTE — ED Triage Notes (Signed)
Pt in via [facility] from home, reports worsening redness, swelling, weeping to bilateral feet x approximately 6 weeks.  Scaling noted to bilateral feet.    Pt also with necrotic appearing area to face, just lateral to right eye.  Pt states area is cancer but he has not been followed by Oncology due to lack of insurance prior to now.  Vitals WDL, NAD noted at this time.    Bedbugs noted per this RN.

## 2019-05-07 NOTE — ED Provider Notes (Signed)
[facility] Emergency Department Provider Note  Time seen: 2:02 PM  I have reviewed the triage vital signs and the nursing notes.   HISTORY  Chief Complaint Cellulitis   HPI Bradley Griffin is a 67 y.o. male with a past medical history of skin cancer, bipolar, diabetes, depression, presents to the emergency department for evaluation of his lower extremities.  According to the patient for the past 6 weeks he has had increased redness and occasional discharge from bilateral lower extremities.  Patient has severe eczema over his body worse so in the lower extremities with significant scaling on his feet.  There was report of bedbugs possible as well.  Patient does not appear to be caring for himself adequately very overgrown finger and toenails, disheveled in appearance.  States skin cancer to his face but states he has not followed up with oncology due to insurance but states he is since obtained Medicaid/Medicare but still is not following up with oncology.  Patient has a Education officer, museum per patient at home apparently APS is already involved with this patient.  Social worker is the one who wanted the patient to come to the hospital today.  As far as the lower extremities patient does have mild erythema with significant skin cracking/scaling of the lower extremities.  Patient denies any fever, no cough congestion or shortness of breath.   Past Medical History:  Diagnosis Date  . Blindness of left eye   . Cancer [facility])    Skin cancer     Patient Active Problem List   Diagnosis Date Noted  . Bipolar 2 disorder ([place]) 02/10/2019  . Borderline personality disorder ([place]) 02/10/2019  . Basal cell carcinoma 02/10/2019  . Diabetes ([place]) 02/10/2019  . 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 Generalized weakness 09/19/2018    Past Surgical History:  Procedure Laterality Date  . CHOLECYSTECTOMY      Prior to Admission  medications   Medication Sig Start Date End Date Taking? Authorizing Provider  citalopram (CELEXA) 20 MG tablet Take 1 tablet (20 mg total) by mouth daily. 02/20/19   Clapacs, Madie Reno, MD  metFORMIN (GLUCOPHAGE) 1000 MG tablet Take 1 tablet (1,000 mg total) by mouth 2 (two) times daily with a meal. 02/20/19   Clapacs, Madie Reno, MD  traZODone (DESYREL) 50 MG tablet Take 1 tablet (50 mg total) by mouth at bedtime as needed for sleep. 02/20/19   Clapacs, Madie Reno, MD    No Known Allergies  No family history on file.  Social History Social History   Tobacco Use  . Smoking status: Never Smoker  . Smokeless tobacco: Never Used  Substance Use Topics  . Alcohol use: Never    Frequency: Never  . Drug use: Never    Review of Systems Constitutional: Negative for fever. Cardiovascular: Negative for chest pain. Respiratory: Negative for shortness of breath. Gastrointestinal: Negative for abdominal pain Musculoskeletal: Negative for musculoskeletal complaints Skin: Significant cracking of the skin with redness in the lower extremities. Neurological: Negative for headache All other ROS negative  ____________________________________________   PHYSICAL EXAM:  VITAL SIGNS: ED Triage Vitals  Enc Vitals Group     BP 05/07/19 1302 (!) 127/91     Pulse Rate 05/07/19 1302 90     Resp 05/07/19 1302 20     Temp 05/07/19 1302 98 F (36.7 C)     Temp Source 05/07/19 1302 Oral     SpO2 05/07/19 1302 100 %  Weight --      Height --      Head Circumference --      Peak Flow --      Pain Score 05/07/19 1323 1     Pain Loc --      Pain Edu? --      Excl. in [place]? --    Constitutional: Patient is awake and alert, disheveled appearance. Eyes: Normal exam ENT      Head: Patient has dried blood/necrotic area approximately 2 to 3 cm in diameter to the right forehead/temple area      Mouth/Throat: Mucous membranes are moist. Cardiovascular: Normal rate, regular rhythm.  Respiratory: Normal  respiratory effort without tachypnea nor retractions. Breath sounds are clear  Gastrointestinal: Soft and nontender. No distention.   Musculoskeletal: Significant scaling with dry skin lower extremities several areas are cracked and erythematous below.  No weepage. Neurologic:  Normal speech and language. No gross focal neurologic deficits Skin: Patient has significant eczema over his body especially in lower extremities with significant scaling of his skin. Psychiatric: Mood and affect are normal.    INITIAL IMPRESSION / ASSESSMENT AND PLAN / ED COURSE  Pertinent labs & imaging results that were available during my care of the patient were reviewed by me and considered in my medical decision making (see chart for details).   Patient presents to the emergency department for medical evaluation.  Patient does have significant eczema over his body worse on the lower extremities with a significant dry scaling skin that is cracked in several places with erythematous skin beneath, no active weeping at this time.  Does appear consistent with possible superinfection in addition to the eczema.  More concerning however is the patient's general disheveled appearance and lack of care for himself including significantly overgrown nails, possible skin cancer to the right face that the patient states he has not followed up with.  Patient states he has no transportation at this time, has no ability to purchase antibiotics as he does not have any money.  We will check basic labs as far as a cellulitis, we will start the patient on Keflex which should be sufficient for any possible superinfection.  Patient will need to follow-up with wound care.  Patient likely needs additional resources at home I have consulted our clinical social worker to help.  Patient's lab work is overall reassuring with a normal white blood cell count.  I have discussed with our social worker who is gotten involved, we have filled out  face-to-face forms for home health PT and nurse aide.  She has restarted the patient APS and social worker is already assigned to the case, they are able to come pick the patient up they will help with the patient's follow-up with oncology.  They are able to pick up the patient's prescription and were able to verify that the patient does have active Medicaid.  We will discharge patient with antibiotics and oncology follow-up.  Hopefully the patient will be able to obtain home health care as well.  Patient agreeable this plan of care.  [name] was evaluated in Emergency Department on 05/07/2019 for the symptoms described in the history of present illness. He was evaluated in the context of the global COVID-19 pandemic, which necessitated consideration that the patient might be at risk for infection with the SARS-CoV-2 virus that causes COVID-19. Institutional protocols and algorithms that pertain to the evaluation of patients at risk for COVID-19 are in a state of rapid  change based on information released by regulatory bodies including the CDC and federal and state organizations. These policies and algorithms were followed during the patient's care in the ED.  ____________________________________________   FINAL CLINICAL IMPRESSION(S) / ED DIAGNOSES  Cellulitis   Harvest Dark, MD 05/07/19 1601

## 2019-05-07 NOTE — TOC Initial Note (Signed)
Transition of Care [facility]) - Initial/Assessment Note    Patient Details  Name: Bradley Griffin MRN: [id number] Date of Birth: 01/15/1951  Transition of Care [facility]) CM/SW Contact:    Trecia Rogers, LCSW Phone Number: 05/07/2019, 3:43 PM  Clinical Narrative:                  Patient is from home. Patient came to the hospital due to worsening redness, swelling, and feet pain. Patient lives alone in an apartment. Pt's APS worker, Bridgette Habermann [phone]) gave information on the patient. She stated that APS got involved and she is his treatment Education officer, museum. She stated that she recently got the patient some medicaid and is currently working on getting him a primary care physician. APS stated that she will be able to pick up his medication for him from [facility] since he has medicaid now.  Patient does have a car but does not have money for the gas but she will be able to pick him up and take him home.   ED doctor stated that he will send a referral to Oncology.   APS feels that he will benefit from home health PT due to his feet and would benefit from a Walker.  Corene Cornea with [facility] was contacted and Brad with [employer] was called for the DME Gilford Rile).   Expected Discharge Plan: [place] Barriers to Discharge: Continued Medical Work up   Patient Goals and CMS Choice Patient states their goals for this hospitalization and ongoing recovery are:: Pt's APS worker, Bridgette Habermann stated, "to get his health back in order" CMS Medicare.gov Compare Post Acute Care list provided to:: Patient Represenative (must comment)(APS worker, Bridgette Habermann) Choice offered to / list presented to : Patient, HC POA / Guardian(APS)  Expected Discharge Plan and Services Expected Discharge Plan: [place] In-house Referral: Clinical Social Work   Post Acute Care Choice: [place] arrangements for the past 2 months: Apartment                  DME Arranged: Gilford Rile DME Agency: [employer] Date DME Agency Contacted: 05/07/19 Time DME Agency Contacted: [phone] Representative spoke with at DME Agency: [place]: PT [place]: [place] ([place]) Date [place]: 05/07/19 Time [place]: 49 Representative spoke with at [place]: Corene Cornea  Prior Living Arrangements/Services Living arrangements for the past 2 months: Apartment Lives with:: Self Patient language and need for interpreter reviewed:: Yes Do you feel safe going back to the place where you live?: Yes            Criminal Activity/Legal Involvement Pertinent to Current Situation/Hospitalization: No - Comment as needed  Activities of Daily Living      Permission Sought/Granted                  Emotional Assessment Appearance:: Appears stated age Attitude/Demeanor/Rapport: Engaged Affect (typically observed): Accepting, [place] Orientation: : Oriented to Self, Oriented to Place, Oriented to  Time   Psych Involvement: No (comment)  Admission diagnosis:  Foot Pain Patient Active Problem List   Diagnosis Date Noted  . Bipolar 2 disorder ([place]) 02/10/2019  . Borderline personality disorder ([place]) 02/10/2019  . Basal cell carcinoma 02/10/2019  . Diabetes ([place]) 02/10/2019  . 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 Generalized weakness 09/19/2018   PCP:  Patient, No Pcp Per Pharmacy:   [facility] [place], [place] - [address] AT [place] [address] Phone: [phone] Fax: [phone]  [id number] - [place], [place] - [address] [place] [address] AT [place] [address] [place] [address] Phone: [phone] Fax: [phone]  [place], [place] [place] [address] Phone:  [phone] Fax: [phone]     Social Determinants of Health (SDOH) Interventions    Readmission Risk Interventions No flowsheet data found.

## 2019-05-07 NOTE — ED Notes (Signed)
Social Work called and patient's APS social worker's number given.  SW spoke with Dr. Kerman Passey re: social concerns.  Continue to monitor.

## 2019-05-28 ENCOUNTER — Encounter (INDEPENDENT_AMBULATORY_CARE_PROVIDER_SITE_OTHER): Payer: Self-pay

## 2019-05-28 ENCOUNTER — Inpatient Hospital Stay: Payer: Medicaid Other | Attending: Oncology | Admitting: Oncology

## 2019-05-28 ENCOUNTER — Encounter: Payer: Self-pay | Admitting: Oncology

## 2019-05-28 ENCOUNTER — Other Ambulatory Visit: Payer: Self-pay

## 2019-05-28 VITALS — BP 129/67 | HR 76 | Temp 98.5°F | Resp 20 | Wt 268.4 lb

## 2019-05-28 DIAGNOSIS — E1169 Type 2 diabetes mellitus with other specified complication: Secondary | ICD-10-CM | POA: Diagnosis not present

## 2019-05-28 DIAGNOSIS — Z85840 Personal history of malignant neoplasm of eye: Secondary | ICD-10-CM | POA: Diagnosis not present

## 2019-05-28 DIAGNOSIS — Z923 Personal history of irradiation: Secondary | ICD-10-CM | POA: Insufficient documentation

## 2019-05-28 DIAGNOSIS — F329 Major depressive disorder, single episode, unspecified: Secondary | ICD-10-CM | POA: Insufficient documentation

## 2019-05-28 DIAGNOSIS — C4491 Basal cell carcinoma of skin, unspecified: Secondary | ICD-10-CM

## 2019-05-28 DIAGNOSIS — H5462 Unqualified visual loss, left eye, normal vision right eye: Secondary | ICD-10-CM | POA: Diagnosis not present

## 2019-05-28 DIAGNOSIS — F332 Major depressive disorder, recurrent severe without psychotic features: Secondary | ICD-10-CM

## 2019-05-28 DIAGNOSIS — Z79899 Other long term (current) drug therapy: Secondary | ICD-10-CM | POA: Diagnosis not present

## 2019-05-28 DIAGNOSIS — L570 Actinic keratosis: Secondary | ICD-10-CM | POA: Insufficient documentation

## 2019-05-28 DIAGNOSIS — Z85820 Personal history of malignant melanoma of skin: Secondary | ICD-10-CM | POA: Diagnosis present

## 2019-05-28 DIAGNOSIS — L989 Disorder of the skin and subcutaneous tissue, unspecified: Secondary | ICD-10-CM

## 2019-05-28 DIAGNOSIS — Z7984 Long term (current) use of oral hypoglycemic drugs: Secondary | ICD-10-CM | POA: Diagnosis not present

## 2019-05-28 DIAGNOSIS — Z86007 Personal history of in-situ neoplasm of skin: Secondary | ICD-10-CM

## 2019-05-28 DIAGNOSIS — E1369 Other specified diabetes mellitus with other specified complication: Secondary | ICD-10-CM

## 2019-05-28 NOTE — Progress Notes (Signed)
Hematology/Oncology Consult note [facility] Telephone:([phone] [phone]   Patient Care Team: Patient, No Pcp Per as PCP - General (General Practice)  REFERRING PROVIDER: Harvest Dark, MD  CHIEF COMPLAINTS/REASON FOR VISIT:  Evaluation of history of skin cancer  HISTORY OF PRESENTING ILLNESS:   Bradley Griffin is a  68 y.o.  male with PMH listed below was seen in consultation at the request of  Harvest Dark, MD  for evaluation of history of skin cancer  #Patient recently presented to [facility] emergency room for evaluation of his lower extremities.  According to patient patient has had experienced worsening of redness, and discharges from bilateral lower extremities.  He has severe eczema over his body and bilateral lower extremity was covered with significant scaling.  He was found to have a general disheveled appearance, and the lack of care for himself including significant overgrown nails, right temple area skin wound. Patient reports that he had a history of skin cancer and was previously treated at [facility].  Also self-reported history of eye melanoma status post radiation more than 10 years ago at [facility]. Patient was covered with antibiotics for possible cellulitis in ER and discharged home.  Due to the history of skin cancer, patient was referred by ER physician to cancer center for further management.  Extensive medical records from care everywhere were reviewed.  Previous dermatology or oncology notes were not available.  There are multiple pathology reports-see below. 08/26/2002 right temple skin shave biopsy-basal cell carcinoma Right lateral brow shave biopsy-basal cell carcinoma Right mid back shave-basal cell carcinoma. 01/15/2004 right upper back skin shave biopsy-lichenoid actinic keratosis with superficial epithelial erosion 01/03/2005 left temple skin shave biopsy excoriated and impetiginized actinic keratosis 04/30/2006 right cheek skin  shave biopsy-keratosis with superficial epithelial erosion Left dorsal hand skin shave biopsy-ulcerated actinic keratosis extending to the deep edge of the biopsy 12/21/2009 right forehead shave biopsy-basal cell carcinoma Left temple shave biopsy-actinic keratosis 01/06/2010 right forehead excision-biopsy site changes and residual basal cell carcinoma, infiltrative pattern, completely excised.  Margins are free Left temple shave-dermal scar and a squamous cell carcinoma in situ-Bowen's disease-extending to the peripheral edge and the base of the specimen. 01/13/2010 left temple excision-biopsy site change and residual squamous cell carcinoma in situ completely excised.  Margins are free 03/19/2012 central back skin shave biopsy-traumatized seborrheic keratosis  Record of previous history of melanoma is not available to me. #She also has history of bipolar disorder, depression with suicide attempt September 2020.  [place].  Patient was recommended to follow-up outpatient with [facility] rescue mission  Patient reports that he does not have a primary care physician.  Previously he was seen by Dr. Lennox Grumbles @ [facility] system.  He expresses his wishes to establish care with a local primary care doctor's office.  Review of Systems  Constitutional: Negative for chills and fever.  HENT:   Negative for sore throat.   Eyes: Negative for icterus.  Respiratory: Negative for shortness of breath.   Cardiovascular: Negative for chest pain.  Gastrointestinal: Negative for abdominal pain.  Genitourinary: Negative for frequency.   Musculoskeletal:       Chronic leg pain  Skin:        skin scaling Right temple skin lesion with bleeding.   Psychiatric/Behavioral: Positive for depression. Negative for suicidal ideas.    MEDICAL HISTORY:  Past Medical History:  Diagnosis Date  . Blindness of  left eye   . Cancer [facility])     Skin cancer     SURGICAL HISTORY: Past Surgical History:  Procedure Laterality Date  . CHOLECYSTECTOMY      SOCIAL HISTORY: Social History   Socioeconomic History  . Marital status: Single    Spouse name: Not on file  . Number of children: Not on file  . Years of education: Not on file  . Highest education level: Not on file  Occupational History  . Not on file  Tobacco Use  . Smoking status: Never Smoker  . Smokeless tobacco: Never Used  Substance and Sexual Activity  . Alcohol use: Never  . Drug use: Never  . Sexual activity: Not Currently  Other Topics Concern  . Not on file  Social History Narrative  . Not on file   Social Determinants of Health   Financial Resource Strain:   . Difficulty of Paying Living Expenses: Not on file  Food Insecurity:   . Worried About Charity fundraiser in the Last Year: Not on file  . Ran Out of Food in the Last Year: Not on file  Transportation Needs:   . Lack of Transportation (Medical): Not on file  . Lack of Transportation (Non-Medical): Not on file  Physical Activity:   . Days of Exercise per Week: Not on file  . Minutes of Exercise per Session: Not on file  Stress:   . Feeling of Stress : Not on file  Social Connections:   . Frequency of Communication with Friends and Family: Not on file  . Frequency of Social Gatherings with Friends and Family: Not on file  . Attends Religious Services: Not on file  . Active Member of Clubs or Organizations: Not on file  . Attends Archivist Meetings: Not on file  . Marital Status: Not on file  Intimate Partner Violence:   . Fear of Current or Ex-Partner: Not on file  . Emotionally Abused: Not on file  . Physically Abused: Not on file  . Sexually Abused: Not on file    FAMILY HISTORY: Family History  Problem Relation Age of Onset  . Leukemia Father     ALLERGIES:  has No Known Allergies.  MEDICATIONS:  Current Outpatient Medications  Medication Sig Dispense  Refill  . cephALEXin (KEFLEX) 500 MG capsule Take 1 capsule (500 mg total) by mouth 3 (three) times daily. (Patient not taking: Reported on 05/28/2019) 30 capsule 0  . citalopram (CELEXA) 20 MG tablet Take 1 tablet (20 mg total) by mouth daily. (Patient not taking: Reported on 05/28/2019) 30 tablet 1  . metFORMIN (GLUCOPHAGE) 1000 MG tablet Take 1 tablet (1,000 mg total) by mouth 2 (two) times daily with a meal. (Patient not taking: Reported on 05/28/2019) 60 tablet 1  . traZODone (DESYREL) 50 MG tablet Take 1 tablet (50 mg total) by mouth at bedtime as needed for sleep. (Patient not taking: Reported on 05/28/2019) 30 tablet 1   No current facility-administered medications for this visit.     PHYSICAL EXAMINATION: ECOG PERFORMANCE STATUS: 2 - Symptomatic, <50% confined to bed Vitals:   05/28/19 1512  BP: 129/67  Pulse: 76  Resp: 20  Temp: 98.5 F (36.9 C)  SpO2: 100%   Filed Weights   05/28/19 1512  Weight: 268 lb 6.4 oz (121.7 kg)    Physical Exam Constitutional:      General: He is not in acute distress.    Appearance: He is obese. He is ill-appearing.  Comments: He sits in a wheelchair.  HENT:     Mouth/Throat:     Mouth: Mucous membranes are moist.  Eyes:     General: No scleral icterus.    Pupils: Pupils are equal, round, and reactive to light.  Cardiovascular:     Rate and Rhythm: Normal rate.  Pulmonary:     Effort: Pulmonary effort is normal.     Comments: Decreased breath sound bilaterally Abdominal:     Palpations: Abdomen is soft.  Musculoskeletal:     Cervical back: Normal range of motion and neck supple.  Skin:    Comments:  dried blood/necrotic area approximately 2 to 3 cm in diameter to the right forehead/temple area scaling of his skin over his body especially in lower extremities   Overgrown nails  Neurological:     Mental Status: He is alert and oriented to person, place, and time.     Cranial Nerves: No cranial nerve deficit.      Coordination: Coordination normal.  Psychiatric:        Behavior: Behavior normal.        Thought Content: Thought content normal.             LABORATORY DATA:  I have reviewed the data as listed Lab Results  Component Value Date   WBC 7.4 05/07/2019   HGB 13.1 05/07/2019   HCT 38.4 (L) 05/07/2019   MCV 92.8 05/07/2019   PLT 251 05/07/2019   Recent Labs    09/19/18 2134 09/20/18 0345 02/07/19 1432 05/07/19 1354  NA 137 140 135 138  K 3.3* 3.1* 3.7 3.4*  CL 98 103 101 104  CO2 22 27 21* 23  GLUCOSE 318* 258* 244* 141*  BUN 11 11 14 11   CREATININE 0.81 0.73 0.83 0.92  CALCIUM 8.5* 8.6* 8.9 8.4*  GFRNONAA >60 >60 >60 >60  GFRAA >60 >60 >60 >60  PROT 6.7 6.6  --  6.8  ALBUMIN 3.6 3.5  --  3.2*  AST 50* 38  --  23  ALT 39 37  --  18  ALKPHOS 51 53  --  60  BILITOT 1.2 1.5*  --  1.2   Iron/TIBC/Ferritin/ %Sat No results found for: IRON, TIBC, FERRITIN, IRONPCTSAT    RADIOGRAPHIC STUDIES: I have personally reviewed the radiological images as listed and agreed with the findings in the report. No results found.    ASSESSMENT & PLAN:  1. Skin lesion   2. history of basal cell carcinoma   3. Other specified diabetes mellitus with other specified complication, without long-term current use of insulin ([place])   4. Major depressive disorder, recurrent severe without psychotic features ([place])   5. History of squamous cell carcinoma in situ (SCCIS) of skin    #Right temple skin lesion Suspect basal cell carcinoma or squamous cell carcinoma. I discussed with patient that I would recommend patient to establish care with dermatology ASAP for further evaluation/biopsy.   #Bilateral lower extremity scaling, questionable eczema versus actinic keratoses.  Pending dermatology evaluation. #Multiple chronic medical problems :major depression,general disheveled appearance, chronic lower extremity wound,-has not seen previous PCP for years. Patient expresses her wishes to  establish care with a local primary care provider.  Will refer.  History of melanoma, no records available to me.  Patient appears to clinically doing well and survived many years after being diagnosed of melanoma. I discussed with patient that I will refer him to establish care with dermatology and primary care provider.  Currently he  does not need to see oncology.  If he develops new problems or biopsy of his skin lesion showed histology that needs chemotherapy, he can call office and reschedule appointment.  He agrees with the plan.  Orders Placed This Encounter  Procedures  . Ambulatory referral to Dermatology    Referral Priority:   Routine    Referral Type:   Consultation    Referral Reason:   Specialty Services Required    Referred to Provider:   Ralene Bathe, MD    Requested Specialty:   Dermatology    Number of Visits Requested:   1  . Ambulatory referral to Internal Medicine    Referral Priority:   Routine    Referral Type:   Consultation    Referral Reason:   Specialty Services Required    Referred to Provider:   McLean-Scocuzza, Nino Glow, MD    Requested Specialty:   Internal Medicine    Number of Visits Requested:   1    All questions were answered. The patient knows to call the clinic with any problems questions or concerns.  Return of visit: No follow-up at this point. Thank you for this kind referral and the opportunity to participate in the care of this patient. A copy of today's note is routed to referring provider    Earlie Server, MD, PhD Hematology Oncology [facility] Pager- [phone] 05/28/2019

## 2019-05-28 NOTE — Progress Notes (Signed)
Patient reports skin cancer on his face for the past year and a place on left arm for 2-3 years.  History of melanoma in left eye where he was treated at [facility] about 9 years ago.

## 2019-06-05 ENCOUNTER — Emergency Department
Admission: EM | Admit: 2019-06-05 | Discharge: 2019-06-26 | Disposition: A | Discharge status: Home / Self Care | Payer: Medicare Other | Attending: Emergency Medicine | Admitting: Emergency Medicine

## 2019-06-05 ENCOUNTER — Other Ambulatory Visit: Payer: Self-pay

## 2019-06-05 ENCOUNTER — Emergency Department: Payer: Medicare Other

## 2019-06-05 DIAGNOSIS — Z20822 Contact with and (suspected) exposure to covid-19: Secondary | ICD-10-CM | POA: Insufficient documentation

## 2019-06-05 DIAGNOSIS — E119 Type 2 diabetes mellitus without complications: Secondary | ICD-10-CM | POA: Insufficient documentation

## 2019-06-05 DIAGNOSIS — Z85820 Personal history of malignant melanoma of skin: Secondary | ICD-10-CM | POA: Diagnosis not present

## 2019-06-05 DIAGNOSIS — W19XXXA Unspecified fall, initial encounter: Secondary | ICD-10-CM | POA: Diagnosis not present

## 2019-06-05 DIAGNOSIS — C4491 Basal cell carcinoma of skin, unspecified: Secondary | ICD-10-CM | POA: Diagnosis not present

## 2019-06-05 DIAGNOSIS — R9082 White matter disease, unspecified: Secondary | ICD-10-CM | POA: Insufficient documentation

## 2019-06-05 DIAGNOSIS — R531 Weakness: Secondary | ICD-10-CM

## 2019-06-05 DIAGNOSIS — Z79899 Other long term (current) drug therapy: Secondary | ICD-10-CM | POA: Insufficient documentation

## 2019-06-05 DIAGNOSIS — Z7984 Long term (current) use of oral hypoglycemic drugs: Secondary | ICD-10-CM | POA: Diagnosis not present

## 2019-06-05 HISTORY — DX: Type 2 diabetes mellitus without complications: E11.9

## 2019-06-05 LAB — BASIC METABOLIC PANEL
Anion gap: 11 (ref 5–15)
BUN: 16 mg/dL (ref 8–23)
CO2: 22 mmol/L (ref 22–32)
Calcium: 8.6 mg/dL — ABNORMAL LOW (ref 8.9–10.3)
Chloride: 103 mmol/L (ref 98–111)
Creatinine, Ser: 0.96 mg/dL (ref 0.61–1.24)
GFR calc Af Amer: >60 mL/min (ref >60)
GFR calc non Af Amer: >60 mL/min (ref >60)
Glucose, Bld: 127 mg/dL — ABNORMAL HIGH (ref 70–99)
Potassium: 3.7 mmol/L (ref 3.5–5.1)
Sodium: 136 mmol/L (ref 135–145)

## 2019-06-05 LAB — CBC
HCT: 44.9 % (ref 39.0–52.0)
Hemoglobin: 15 g/dL (ref 13.0–17.0)
MCH: 31.8 pg (ref 26.0–34.0)
MCHC: 33.4 g/dL (ref 30.0–36.0)
MCV: 95.3 fL (ref 80.0–100.0)
Platelets: 322 10*3/uL (ref 150–400)
RBC: 4.71 MIL/uL (ref 4.22–5.81)
RDW: 15.1 % (ref 11.5–15.5)
WBC: 10.8 10*3/uL — ABNORMAL HIGH (ref 4.0–10.5)
nRBC: 0 % (ref 0.0–0.2)

## 2019-06-05 LAB — URINALYSIS, COMPLETE (UACMP) WITH MICROSCOPIC
Bacteria, UA: NONE SEEN
Bilirubin Urine: NEGATIVE
Glucose, UA: NEGATIVE mg/dL
Hgb urine dipstick: NEGATIVE
Ketones, ur: NEGATIVE mg/dL
Leukocytes,Ua: NEGATIVE
Nitrite: NEGATIVE
Protein, ur: 100 mg/dL — AB
Specific Gravity, Urine: 1.026 (ref 1.005–1.030)
pH: 5 (ref 5.0–8.0)

## 2019-06-05 MED ORDER — SODIUM CHLORIDE 0.9% FLUSH: 3.0000 mL | Freq: Once | INTRAVENOUS | Status: DC

## 2019-06-05 NOTE — ED Triage Notes (Signed)
Pt comes into the Ed via EMS from home with c/o fall, denies injury, per EMS pt has been out of power for the past month, pt is unable to get a hold of his SW last night ANdrews. Pt is wanting help. Pt has numerous medical and social needs.

## 2019-06-05 NOTE — ED Notes (Signed)
Pt given ice cola. Lights dimmed

## 2019-06-05 NOTE — ED Notes (Signed)
Pt resting at this time.  Call bell within reach.

## 2019-06-05 NOTE — ED Notes (Signed)
Pt given meal tray.

## 2019-06-05 NOTE — ED Notes (Signed)
Pt given phone and talking to SW at this time.

## 2019-06-05 NOTE — ED Notes (Signed)
Pt ambulated to toilet. NAD noted

## 2019-06-05 NOTE — ED Notes (Signed)
Pt's social worker Bridgette Habermann [phone]

## 2019-06-05 NOTE — ED Provider Notes (Signed)
[facility] Emergency Department Provider Note  Time seen: 4:50 PM  I have reviewed the triage vital signs and the nursing notes.   HISTORY  Chief Complaint Fall   HPI Bradley Griffin is a 69 y.o. male with a past medical history of bipolar, basal cell carcinoma to the face, diabetes, depression, generalized weakness presents to the emergency department with multiple complaints.  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  Patient was referred to dermatology but states he has not been able to follow-up as he does not have transportation money or a phone.  Patient lives alone in an apartment and states his electricity was turned off this week because he cannot afford his bills.  Patient has known basal cell carcinoma to the right face but has not been able to follow-up with an oncologist again because he has no transportation money or phone.  Patient does not appear to have any acute medical complaints specifically denies any fever cough congestion or shortness of breath.   Past Medical History:  Diagnosis Date  . Basal cell carcinoma    Skin cancer   . Blindness of left eye   . Squamous cell carcinoma in situ 2011    Patient Active Problem List   Diagnosis Date Noted  . Bipolar 2 disorder ([place]) 02/10/2019  . Borderline personality disorder ([place]) 02/10/2019  . Basal cell carcinoma 02/10/2019  . Diabetes ([place]) 02/10/2019  . Major depressive disorder, recurrent severe without psychotic features ([place]) 02/07/2019  . Generalized weakness 09/19/2018    Past Surgical History:  Procedure Laterality Date  . CHOLECYSTECTOMY      Prior to Admission medications   Medication Sig Start Date End Date Taking?  Authorizing Provider  cephALEXin (KEFLEX) 500 MG capsule Take 1 capsule (500 mg total) by mouth 3 (three) times daily. Patient not taking: Reported on 05/28/2019 05/07/19   Harvest Dark, MD  citalopram (CELEXA) 20 MG tablet Take 1 tablet (20 mg total) by mouth daily. Patient not taking: Reported on 05/28/2019 02/20/19   Clapacs, Madie Reno, MD  metFORMIN (GLUCOPHAGE) 1000 MG tablet Take 1 tablet (1,000 mg total) by mouth 2 (two) times daily with a meal. Patient not taking: Reported on 05/28/2019 02/20/19   Clapacs, Madie Reno, MD  traZODone (DESYREL) 50 MG tablet Take 1 tablet (50 mg total) by mouth at bedtime as needed for sleep. Patient not taking: Reported on 05/28/2019 02/20/19   Clapacs, Madie Reno, MD    No Known Allergies  Family History  Problem Relation Age of Onset  . Leukemia Father     Social History Social History   Tobacco Use  . Smoking status: Never Smoker  . Smokeless tobacco: Never Used  Substance Use Topics  . Alcohol use: Never  . Drug use: Never    Review of Systems Constitutional: Negative for fever. Cardiovascular: Negative for chest pain. Respiratory: Negative for shortness of breath.  Negative for cough. Gastrointestinal: Negative for abdominal pain, vomiting Musculoskeletal: Negative for musculoskeletal complaints Skin: Dry and scaling skin, mild erythema of his feet bilaterally. Neurological: Negative for headache All other ROS negative  ____________________________________________   PHYSICAL EXAM:  VITAL SIGNS: ED Triage Vitals  Enc Vitals Group     BP 06/05/19 1437 (!) 92/55     Pulse  Rate 06/05/19 1437 84     Resp 06/05/19 1437 (!) 22     Temp 06/05/19 1437 (!) 96 F (35.6 C)     Temp Source 06/05/19 1437 Oral     SpO2 06/05/19 1437 100 %     Weight 06/05/19 1405 268 lb 6.4 oz (121.7 kg)     Height 06/05/19 1405 6' (1.829 m)     Head Circumference --      Peak Flow --      Pain Score 06/05/19 1550 0     Pain Loc --      Pain Edu? --       Excl. in [place]? --    Constitutional: Alert and oriented.  Disheveled appearance, no acute distress Eyes: Normal exam ENT      Head: Mild bleeding wound to the right temple area which is where he states his "cancer" is      Mouth/Throat: Mucous membranes are moist. Cardiovascular: Normal rate, regular rhythm. No murmur Respiratory: Normal respiratory effort without tachypnea nor retractions. Breath sounds are clear  Gastrointestinal: Soft and nontender. No distention.  Musculoskeletal: Nontender with normal range of motion in all extremities.  Neurologic:  Normal speech and language. No gross focal neurologic deficits Skin: Extremely dry and scaling/cracked skin especially of his lower extremities with mild erythema of his feet although improved from his prior visit. Psychiatric: Mood and affect are normal.   ____________________________________________    EKG  EKG viewed and interpreted by myself shows a sinus rhythm 81 bpm with a narrow QRS, left axis deviation, slight PR prolongation otherwise normal intervals nonspecific ST changes without ST elevation.  ____________________________________________    RADIOLOGY  CT head negative  ____________________________________________   INITIAL IMPRESSION / ASSESSMENT AND PLAN / ED COURSE  Pertinent labs & imaging results that were available during my care of the patient were reviewed by me and considered in my medical decision making (see chart for details).   Patient presents emergency department with generalized complaints including skin flaking and dryness, cancerous process to the face, unable to get follow-up due to social issues, currently has no electricity at his house, no phone, once again due to financial and social issues.  Patient lives alone and in his current state is in no shape to be discharged home when he has no electricity or phone or transportation.  Patient needs to be seen by dermatology as well as oncology but again  due to social issues is not able to do so.  Last time I saw the patient in the emergency department I did consult social work we attempted to increase his home health although the patient states they only come once per week, which is insufficient for the patient's needs.  We will discuss with social work as well as get a PT eval for possible placement or significantly increased home health care needs.  Medical work-up shows a negative CT of the head, lab work is largely nonrevealing.  [name] was evaluated in Emergency Department on 06/05/2019 for the symptoms described in the history of present illness. He was evaluated in the context of the global COVID-19 pandemic, which necessitated consideration that the patient might be at risk for infection with the SARS-CoV-2 virus that causes COVID-19. Institutional protocols and algorithms that pertain to the evaluation of patients at risk for COVID-19 are in a state of rapid change based on information released by regulatory bodies including the CDC and federal and state organizations. These policies and  algorithms were followed during the patient's care in the ED.  ____________________________________________   FINAL CLINICAL IMPRESSION(S) / ED DIAGNOSES  Weakness    Harvest Dark, MD 06/05/19 2214

## 2019-06-06 DIAGNOSIS — R531 Weakness: Secondary | ICD-10-CM | POA: Diagnosis not present

## 2019-06-06 LAB — SARS CORONAVIRUS 2 (TAT 6-24 HRS): SARS Coronavirus 2: NEGATIVE

## 2019-06-06 NOTE — Evaluation (Addendum)
Physical Therapy Evaluation Patient Details Name: Bradley Griffin MRN: [id number] DOB: August 17, 1950 Today's Date: 06/06/2019   History of Present Illness  69 yo male presenting to ED from home with complaints of worsening redness, swelling, weeping to BLE x6 weeks, pt also has necrotic appearing area to face lateral to right eye. PMH includes skin cancer, bipolar disorder, DM, depresison  Clinical Impression  Pt is a 69yo male seen in ED for above. Pt agreeable to PT. Pt reports living alone in a one level apartment with no outside support. Pt reports being independent with ADLs however endorses that he is unable to cook meals due to inability to stand for long periods of time due to LE pain and that self care and personal hygiene has been an issue. Pt stating that he wants to get into a home. Pt reporting mod-severe B LE pain especially during and post ambulation. Pt presents with severe skin flaking on B LE and toe nails unkept. Pt required min A to get EOB and min guard assist for transfers and ambulation. Pt tolerated minimal ambulation before requesting to sit down secondary to LE pain. Pt only able to tolerate a few seated LE therex before refusing further activity. Pt educated on LE therex to do on his own during the day with pt verbalizing understanding. Pt alert and oriented but appeared to have decreased cognitive functioning throughout session. Pt presents with decreased strength, balance and activity tolerance limiting functional mobility. Pt endorses having 4 recent falls. Pt will benefit from further acute PT to improve deficits. Recommendation for SNF following hospital discharge to improve deficits and functional mobility, improve independence with ADLs and decrease fall risk. Concern for pt returning home with home health since pt has had recent falls and has no assistance at home and endorses having difficulty with personal hygiene and not able to cook meals. If unable to get STR pt will need  home health PT with 3in1 to allow for safe mobility in bathroom.     Follow Up Recommendations SNF    Equipment Recommendations  3in1 (PT);Other (comment)(pt already has RW)    Recommendations for Other Services OT consult     Precautions / Restrictions Precautions Precautions: Fall Restrictions Weight Bearing Restrictions: No      Mobility  Bed Mobility Overal bed mobility: Needs Assistance Bed Mobility: Supine to Sit;Sit to Supine     Supine to sit: Min assist;HOB elevated Sit to supine: Min guard;HOB elevated   General bed mobility comments: min A to get EOB with pt reaching for therapist, increased time and effort, min guard to return to supine  Transfers Overall transfer level: Needs assistance Equipment used: Rolling walker (2 wheeled) Transfers: Sit to/from Stand Sit to Stand: Min guard         General transfer comment: min guard for safety, cuing required for hand placement and use of RW, overall steady with transfer and pt able to perform without AD however improved stability wtih RW  Ambulation/Gait Ambulation/Gait assistance: Min guard Gait Distance (Feet): 30 Feet Assistive device: Rolling walker (2 wheeled) Gait Pattern/deviations: Step-through pattern;Decreased stride length;Trunk flexed Gait velocity: decreased   General Gait Details: pt ambulated within ED room with RW, overall steady, pt states he typically does not use a RW but appears to be more steady with RW, took a few steps EOB without AD in order to get closer to head of the bed for returning to supine with mild unsteadiness and at least one UE support by gurney,no  buckling or overt LOB noted during ambulation, pt unable to ambulate any further due to increased LE pain  Stairs            Wheelchair Mobility    Modified Rankin (Stroke Patients Only)       Balance Overall balance assessment: Needs assistance Sitting-balance support: Feet supported;Bilateral upper extremity  supported Sitting balance-Leahy Scale: Fair Sitting balance - Comments: steady sitting edge of gurney with feet supported and BUE support   Standing balance support: Bilateral upper extremity supported;During functional activity Standing balance-Leahy Scale: Fair Standing balance comment: improved balance with BUE support on RW                             Pertinent Vitals/Pain Pain Assessment: Faces Faces Pain Scale: Hurts whole lot Pain Location: B LE especially with mobility Pain Descriptors / Indicators: Grimacing;Sore;Aching;Discomfort Pain Intervention(s): Limited activity within patient's tolerance;Monitored during session    Home Living Family/patient expects to be discharged to:: Private residence Living Arrangements: Alone Available Help at Discharge: Other (Comment)(None) Type of Home: Apartment Home Access: Level entry     Home Layout: One level Home Equipment: Cane - single point;Walker - 2 wheels Additional Comments: does not use RW, occasionally uses SPC    Prior Function Level of Independence: Independent         Comments: pt reporting indendent with ADLs, endorses 4 falls last 8 months, still driving, reports that he does not cook meals because he cannot stand very long due to LE pain, reports difficulty with personal self care     Hand Dominance        Extremity/Trunk Assessment   Upper Extremity Assessment Upper Extremity Assessment: Generalized weakness    Lower Extremity Assessment Lower Extremity Assessment: Generalized weakness       Communication   Communication: No difficulties  Cognition Arousal/Alertness: Awake/alert Behavior During Therapy: WFL for tasks assessed/performed Overall Cognitive Status: Within Functional Limits for tasks assessed                                 General Comments: oriented however appeared slightly confused with decreased cognitive functioning, slow processing, hx of mental  illness      General Comments General comments (skin integrity, edema, etc.): severe flaking, scaly skin on BLE, very poorly maintained toenails    Exercises Total Joint Exercises Long Arc Quad: AROM;Both;5 reps Marching in Standing: AROM;Both;5 reps Other Exercises Other Exercises: pt educated on LE therex to do on his own in supine including AP, heel slides, SLR with pt verbalizing understanding   Assessment/Plan    PT Assessment Patient needs continued PT services  PT Problem List Decreased strength;Decreased mobility;Decreased safety awareness;Decreased range of motion;Decreased cognition;Decreased activity tolerance;Decreased balance;Decreased knowledge of use of DME;Pain       PT Treatment Interventions DME instruction;Therapeutic exercise;Gait training;Balance training;Stair training;Neuromuscular re-education;Functional mobility training;Therapeutic activities;Patient/family education;Cognitive remediation    PT Goals (Current goals can be found in the Care Plan section)  Acute Rehab PT Goals Patient Stated Goal: get into a home PT Goal Formulation: With patient Time For Goal Achievement: 06/20/19 Potential to Achieve Goals: Good    Frequency Min 2X/week   Barriers to discharge Decreased caregiver support      Co-evaluation               AM-PAC PT "6 Clicks" Mobility  Outcome Measure Help needed turning  from your back to your side while in a flat bed without using bedrails?: A Little Help needed moving from lying on your back to sitting on the side of a flat bed without using bedrails?: A Little Help needed moving to and from a bed to a chair (including a wheelchair)?: A Little Help needed standing up from a chair using your arms (e.g., wheelchair or bedside chair)?: A Little Help needed to walk in hospital room?: A Little Help needed climbing 3-5 steps with a railing? : A Lot 6 Click Score: 17    End of Session Equipment Utilized During Treatment: Gait  belt Activity Tolerance: Patient tolerated treatment well Patient left: in chair;with call bell/phone within reach Nurse Communication: Mobility status PT Visit Diagnosis: Muscle weakness (generalized) (M62.81);Difficulty in walking, not elsewhere classified (R26.2);Unsteadiness on feet (R26.81)    Time: [id number] PT Time Calculation (min) (ACUTE ONLY): 21 min   Charges:   PT Evaluation $PT Eval Moderate Complexity: 1 Mod PT Treatments $Therapeutic Exercise: 8-22 mins        Mirabelle Cyphers PT, DPT 11:10 AM,06/06/19 [phone]   Hyland Mollenkopf M Deolinda Frid 06/06/2019, 11:09 AM

## 2019-06-06 NOTE — ED Notes (Signed)
Physical therapy at bedside

## 2019-06-06 NOTE — ED Notes (Signed)
Pt given ice water.

## 2019-06-06 NOTE — ED Notes (Signed)
Pt requesting ice water and tissue d/t picking at scabs on his face.  Given water and tissue. Readjusted in bed. Lights dimmed, call bell within reach

## 2019-06-06 NOTE — Care Management (Addendum)
RN CM: Incoming call from Bridgette Habermann @ [phone] requesting assistance with SNF placement that will lead to eventual LTC. Needing FL2 to assist with LTC placement.   RN CM: Completed FL2. PASSR pending further information due to [facility]

## 2019-06-06 NOTE — ED Notes (Signed)
Pt resting comfortably at this time.

## 2019-06-06 NOTE — NC FL2 (Signed)
  [place] LEVEL OF CARE SCREENING TOOL     IDENTIFICATION  Patient Name: Bradley Griffin Birthdate: 1951-03-16 Sex: male Admission Date (Current Location): 06/05/2019  [facility] and [place] Number:  Engineering geologist and Address:         Provider Number: [id number]  Attending Physician Name and Address:  No att. providers found  Relative Name and Phone Number:       Current Level of Care: Hospital Recommended Level of Care: [place] Prior Approval Number:    Date Approved/Denied:   PASRR Number: [id number]  Discharge Plan: SNF    Current Diagnoses: Patient Active Problem List   Diagnosis Date Noted  . Bipolar 2 disorder ([place]) 02/10/2019  . Borderline personality disorder ([place]) 02/10/2019  . Basal cell carcinoma 02/10/2019  . Diabetes ([place]) 02/10/2019  . Major depressive disorder, recurrent severe without psychotic features ([place]) 02/07/2019  . Generalized weakness 09/19/2018    Orientation RESPIRATION BLADDER Height & Weight     Self, Time, Situation, Place  Normal Continent Weight: 121.7 kg Height:  6' (182.9 cm)  BEHAVIORAL SYMPTOMS/MOOD NEUROLOGICAL BOWEL NUTRITION STATUS      Continent Diet  AMBULATORY STATUS COMMUNICATION OF NEEDS Skin   Extensive Assist Verbally Normal                       Personal Care Assistance Level of Assistance  Bathing, Dressing Bathing Assistance: Limited assistance   Dressing Assistance: Limited assistance     Functional Limitations Info             SPECIAL CARE FACTORS FREQUENCY  PT (By licensed PT), OT (By licensed OT)     PT Frequency: min 5/weekly OT Frequency: min 5/weekly            Contractures      Additional Factors Info                  Current Medications (06/06/2019):  This is the current hospital active medication list Current Facility-Administered Medications  Medication Dose Route Frequency Provider Last Rate Last Admin  . sodium chloride flush  (NS) 0.9 % injection 3 mL  3 mL Intravenous Once Harvest Dark, MD       Current Outpatient Medications  Medication Sig Dispense Refill  . cephALEXin (KEFLEX) 500 MG capsule Take 1 capsule (500 mg total) by mouth 3 (three) times daily. (Patient not taking: Reported on 05/28/2019) 30 capsule 0  . citalopram (CELEXA) 20 MG tablet Take 1 tablet (20 mg total) by mouth daily. (Patient not taking: Reported on 05/28/2019) 30 tablet 1  . metFORMIN (GLUCOPHAGE) 1000 MG tablet Take 1 tablet (1,000 mg total) by mouth 2 (two) times daily with a meal. (Patient not taking: Reported on 05/28/2019) 60 tablet 1  . traZODone (DESYREL) 50 MG tablet Take 1 tablet (50 mg total) by mouth at bedtime as needed for sleep. (Patient not taking: Reported on 05/28/2019) 30 tablet 1     Discharge Medications: Please see discharge summary for a list of discharge medications.  Relevant Imaging Results:  Relevant Lab Results:   Additional Information [id number]  [name], RN

## 2019-06-06 NOTE — ED Notes (Signed)
Resumed care from [place], [place]. Pt resting comfortably. Denies pain or other needs at this time. Pt states he has no power in his home and no heat because he cannot pay the bill and has no one to help him. Pt moaning, but denies pain.

## 2019-06-06 NOTE — ED Notes (Signed)
rcvd call from Bridgette Habermann of [employer]. She states that she has been working with pt for about two months and has helped him get medicaid and food stamps. She has been trying to get him a primary care doctor but has had difficulty doing so. She does have an appointment for him in April with a pcp. She states that he has been resistant to going to a facility to this point, and that she found out yesterday that he did not have electricity. She was told he had only been without power for 2 days (by pt). Bradley Griffin was given phone number for care manager and said she would call, as she is trying to get FL2, as that is required for placement.

## 2019-06-06 NOTE — ED Notes (Signed)
Pt resting at this time.

## 2019-06-06 NOTE — ED Notes (Signed)
Pt given dinner tray and ginger ale after VS taken.

## 2019-06-06 NOTE — ED Notes (Signed)
Pt resting comfortably at this time. Lights dimmed. Call light within reach.

## 2019-06-06 NOTE — ED Notes (Signed)
Pt given jello per request

## 2019-06-06 NOTE — TOC Initial Note (Signed)
Transition of Care [facility]) - Initial/Assessment Note    Patient Details  Name: Bradley Griffin MRN: [id number] Date of Birth: Oct 25, 1950  [facility]) CM/SW Contact:    [name], RN Phone Number: 06/06/2019, 2:21 PM  Clinical Narrative:                 Patient here at the request of his APS social worker seeking SNF placement for short term rehab and eventual LTC. Recently set up for Medicaid and food stamps but unable to care for himself at home.   Expected Discharge Plan: [place]     Patient Goals and CMS Choice Patient states their goals for this hospitalization and ongoing recovery are:: get into a facility long term care after rehab   Choice offered to / list presented to : Patient  Expected Discharge Plan and Services Expected Discharge Plan: [place]       Living arrangements for the past 2 months: Apartment                                      Prior Living Arrangements/Services Living arrangements for the past 2 months: Apartment Lives with:: Self Patient language and need for interpreter reviewed:: Yes Do you feel safe going back to the place where you live?: No   no electricity and no ability to care for himself  Need for Family Participation in Patient Care: Yes (Comment) Care giver support system in place?: No (comment)(No support system)   Criminal Activity/Legal Involvement Pertinent to Current Situation/Hospitalization: No - Comment as needed  Activities of Daily Living      Permission Sought/Granted                  Emotional Assessment Appearance:: Appears older than stated age Attitude/Demeanor/Rapport: Engaged Affect (typically observed): Accepting Orientation: : Oriented to Self, Oriented to Place, Oriented to  Time, Oriented to Situation Alcohol / Substance Use: Never Used Psych Involvement: No (comment)  Admission diagnosis:  ems lobby/fall Patient Active Problem List   Diagnosis Date Noted  . Bipolar 2 disorder ([place]) 02/10/2019  . Borderline personality disorder ([place]) 02/10/2019  . Basal cell carcinoma 02/10/2019  . Diabetes ([place]) 02/10/2019  . Major depressive disorder, recurrent severe without psychotic features ([place]) 02/07/2019  . Generalized weakness 09/19/2018   PCP:  Patient, No Pcp Per Pharmacy:   [facility] [place], [place] - [place] AT [facility] [address] [place] [address] Phone: [phone] Fax: [phone]  [id number] - [place], [place] - [address] AT [place] [address] [place] [address] Phone: [phone] Fax: [phone]  [place], [place] [address] Phone: [phone] Fax: [phone]     Social Determinants of Health (SDOH) Interventions    Readmission Risk Interventions No flowsheet data found.

## 2019-06-06 NOTE — ED Provider Notes (Signed)
-----------------------------------------   2:02 AM on 06/06/2019 -----------------------------------------   Blood pressure 125/79, pulse (!) 101, temperature (!) 96 F (35.6 C), temperature source Oral, resp. rate (!) 24, height 1.829 m (6'), weight 121.7 kg, SpO2 100 %.  The patient is calm and cooperative at this time.  There have been no acute events since the last update.  Awaiting disposition plan from Social Work team(s).   Hinda Kehr, MD 06/06/19 [phone]

## 2019-06-06 NOTE — ED Notes (Signed)
Pt given ginger ale.

## 2019-06-06 NOTE — ED Notes (Signed)
Pt repositioned. Call bell within reach. Lights dimmed

## 2019-06-06 NOTE — ED Notes (Signed)
Pt given breakfast tray and gingerale 

## 2019-06-06 NOTE — ED Notes (Signed)
Pt given lunch tray and gingerale

## 2019-06-06 NOTE — ED Notes (Signed)
Pt up to toilet 

## 2019-06-07 DIAGNOSIS — R531 Weakness: Secondary | ICD-10-CM | POA: Diagnosis not present

## 2019-06-07 LAB — GLUCOSE, CAPILLARY: Glucose-Capillary: 136 mg/dL — ABNORMAL HIGH (ref 70–99)

## 2019-06-07 NOTE — ED Notes (Addendum)
Pt appears sleeping with lights and TV on; will follow up

## 2019-06-07 NOTE — ED Notes (Signed)
PT given water.

## 2019-06-07 NOTE — ED Notes (Signed)
Pt given breakfast tray

## 2019-06-07 NOTE — ED Notes (Signed)
Pt given meal tray and coke.  

## 2019-06-07 NOTE — ED Notes (Signed)
Pt given dinner tray.

## 2019-06-07 NOTE — ED Notes (Signed)
Asked pt about home medications, he states that he does not take any. Discussed with doctor that no medications need to be ordered at this time. Pt does state that he is a diabetic.

## 2019-06-07 NOTE — ED Notes (Signed)
Call bell light answered: Pt was ambulatory to toilet and spilled water given earlier, floor toweled  Pt watching TV, warm blankets and diet ginger ale given

## 2019-06-07 NOTE — ED Notes (Signed)
Pt ate all of lunch tray, provided another coke and warm blankets at this time.

## 2019-06-07 NOTE — ED Notes (Signed)
Pt given ice water and repositioned in the bed.

## 2019-06-08 DIAGNOSIS — R531 Weakness: Secondary | ICD-10-CM | POA: Diagnosis not present

## 2019-06-08 MED ORDER — IBUPROFEN 400 MG PO TABS
400.0000 mg | ORAL_TABLET | Freq: Once | ORAL | Status: AC
  Administered 2019-06-08: 400 mg via ORAL
  Filled 2019-06-08: qty 1

## 2019-06-08 NOTE — ED Notes (Signed)
Diet gingerale given

## 2019-06-08 NOTE — ED Notes (Signed)
Pt given lunch meal tray. 

## 2019-06-08 NOTE — ED Notes (Addendum)
Pt c/o bilateral leg soreness from his eczema, pt requesting pain medication. Dr. Jari Pigg made aware

## 2019-06-08 NOTE — ED Notes (Addendum)
Problem list and meds from last DC:  Major depressive disorder, recurrent severe without psychotic features ([place])  Bipolar 2 disorder (HCC)  Borderline personality disorder (HCC)orderline personality disorder (HCC)  Basal cell carcinoma  Diabetes (HCC)   Citalopram Hydrobromide 20 mg Oral Daily  metFORMIN HCl 1,000 mg Oral 2 times daily with meals  traZODone HCl 50 mg Oral At bedtime PRN

## 2019-06-08 NOTE — ED Notes (Signed)
Report received from Noel, RN. 

## 2019-06-08 NOTE — ED Notes (Signed)
Pt given breakfast tray

## 2019-06-08 NOTE — ED Notes (Signed)
Pt awake; no needs at this time; NAD

## 2019-06-08 NOTE — ED Notes (Signed)
Pt given dinner lunch tray

## 2019-06-08 NOTE — ED Provider Notes (Signed)
-----------------------------------------   6:26 AM on 06/08/2019 -----------------------------------------   Blood pressure 130/82, pulse 96, temperature 97.6 F (36.4 C), temperature source Oral, resp. rate 16, height 6' (1.829 m), weight 121.7 kg, SpO2 100 %.  The patient is calm and cooperative at this time.  There have been no acute events since the last update.  Awaiting disposition plan from Social Work team(s).   Paulette Blanch, MD 06/08/19 [phone]

## 2019-06-09 DIAGNOSIS — R531 Weakness: Secondary | ICD-10-CM | POA: Diagnosis not present

## 2019-06-09 MED ORDER — IBUPROFEN 600 MG PO TABS
600.0000 mg | ORAL_TABLET | Freq: Once | ORAL | Status: AC
  Administered 2019-06-09: 10:00:00 600 mg via ORAL
  Filled 2019-06-09: qty 1

## 2019-06-09 MED ORDER — ACETAMINOPHEN 500 MG PO TABS
1000.0000 mg | ORAL_TABLET | Freq: Once | ORAL | Status: AC
  Administered 2019-06-09: 1000 mg via ORAL
  Filled 2019-06-09: qty 2

## 2019-06-09 NOTE — Progress Notes (Signed)
PT Cancellation Note  Patient Details Name: ASEEL LAWS MRN: [id number] DOB: 18-Mar-1951   Cancelled Treatment:    Reason Eval/Treat Not Completed: Patient declined, no reason specified. Pt politely declining PT this morning secondary to pain. Pt reporting his pain has increased and he is currently unable to do any exercises or ambulation. Pt reporting increased difficulty ambulating in room to restroom due to increased LE pain. Pt requesting to see an MD. Pt visibly concerned about increased intensity of pain and not having yet seen an MD. PT will follow up at later date/time as able and pt appropriate.   Zachary George PT, DPT 11:00 AM,06/09/19 [phone]    Mirah Nevins [place] Chalet 06/09/2019, 10:58 AM

## 2019-06-09 NOTE — ED Notes (Signed)
This RN gave pt ice water.

## 2019-06-09 NOTE — ED Notes (Signed)
Patient resting in bed. Call bell in reach. Patient is watching tv.

## 2019-06-09 NOTE — ED Notes (Signed)
Patient ate all of lunch tray

## 2019-06-09 NOTE — ED Notes (Signed)
Patient given diet soda. 

## 2019-06-09 NOTE — TOC Progression Note (Signed)
Transition of Care [facility]) - Progression Note    Patient Details  Name: Bradley Griffin MRN: [id number] Date of Birth: May 09, 1951  [facility]) CM/SW Contact  Shelbie Ammons, RN Phone Number: 06/09/2019, 12:25 PM  Clinical Narrative:   [facility] faxed additional information requested for [facility], and updated DSS SW Brooke via telephone.     Expected Discharge Plan: [place]    Expected Discharge Plan and Services Expected Discharge Plan: [place] arrangements for the past 2 months: Apartment                                       Social Determinants of Health (SDOH) Interventions    Readmission Risk Interventions No flowsheet data found.

## 2019-06-09 NOTE — ED Notes (Signed)
Upon entering pt room this nurse observed pt resting in bed. Pt opened his eyes and responded to nurse then fell back asleep. Pt respirations are equal and unlabored, no signs of acute distress at this time.

## 2019-06-09 NOTE — ED Notes (Signed)
This nurse gave pt [place] sandwich tray.

## 2019-06-09 NOTE — ED Notes (Addendum)
Floor swept and mopped by housekeeping. Patient given breakfast tray and diet cokes. PAtient requesting to see emergency room MD. MD Joni Fears made aware

## 2019-06-09 NOTE — ED Notes (Signed)
Pt up to restroom independently and reports voiding small amount. Pt alert and calm at this time. States he ate all his dinner tray. Provided for comfort and safety and will continue to assess.

## 2019-06-09 NOTE — ED Notes (Signed)
Patient given dinner tray.

## 2019-06-09 NOTE — ED Provider Notes (Signed)
-----------------------------------------   9:00 AM on 06/09/2019 -----------------------------------------   Blood pressure 140/88, pulse 90, temperature 97.6 F (36.4 C), temperature source Oral, resp. rate 16, height 6' (1.829 m), weight 121.7 kg, SpO2 100 %.  The patient is calm and cooperative at this time.  There have been no acute events since the last update.  Awaiting disposition plan from Social Work team(s).   Carrie Mew, MD 06/09/19 [phone]

## 2019-06-10 MED ORDER — ACETAMINOPHEN 325 MG PO TABS
650.0000 mg | ORAL_TABLET | Freq: Four times a day (QID) | ORAL | Status: DC | PRN
  Administered 2019-06-10 – 2019-06-11 (×5): 650 mg via ORAL
  Filled 2019-06-10 (×5): qty 2

## 2019-06-10 NOTE — ED Notes (Signed)
Upon entering room, pt is sleeping in bed. Respirations are equal and unlabored. No signs of acute distress.

## 2019-06-10 NOTE — TOC Progression Note (Signed)
Transition of Care [facility]) - Progression Note    Patient Details  Name: Bradley Griffin MRN: [id number] Date of Birth: Jun 09, 1950  Transition of Care [facility]) CM/SW Contact  Shelbie Ammons, RN Phone Number: 06/10/2019, 2:07 PM  Clinical Narrative:   RNCM assisted PASSR worker Bea in completing PASSR assessment at bedside.     Expected Discharge Plan: [place]    Expected Discharge Plan and Services Expected Discharge Plan: [place] arrangements for the past 2 months: Apartment                                       Social Determinants of Health (SDOH) Interventions    Readmission Risk Interventions No flowsheet data found.

## 2019-06-10 NOTE — ED Notes (Signed)
amb to toilet with walker with minimal assit.  Sitting on side of bed.  Gave tylenol for his leg and feet pain

## 2019-06-10 NOTE — ED Notes (Signed)
Pt given dinner tray.

## 2019-06-10 NOTE — ED Notes (Signed)
Pt c/o of IV pain and that it was bothering him. Did not see the need for an IV line. Removed IV line

## 2019-06-10 NOTE — ED Notes (Signed)
Report received from Liz, RN.

## 2019-06-10 NOTE — ED Notes (Signed)
Pt given lunch meal tray. 

## 2019-06-10 NOTE — ED Notes (Addendum)
Pt ambulated with walker to the restroom. Standby assist

## 2019-06-10 NOTE — ED Provider Notes (Signed)
-----------------------------------------   3:50 AM on 06/10/2019 -----------------------------------------   Blood pressure 124/61, pulse 60, temperature 98.1 F (36.7 C), temperature source Oral, resp. rate 16, height 6' (1.829 m), weight 121.7 kg, SpO2 100 %.  The patient had no acute events since last update.  Calm and cooperative at this time.  Disposition is pending per Psychiatry/Behavioral Medicine team recommendations.     Alfred Levins, [place], MD 06/10/19 [phone]

## 2019-06-10 NOTE — ED Notes (Signed)
Pt given warm blanket.

## 2019-06-11 DIAGNOSIS — R531 Weakness: Secondary | ICD-10-CM | POA: Diagnosis not present

## 2019-06-11 MED ORDER — HYDROCORTISONE 1 % EX CREA
TOPICAL_CREAM | Freq: Two times a day (BID) | CUTANEOUS | Status: DC
  Administered 2019-06-11 – 2019-06-26 (×5): 1 via TOPICAL
  Filled 2019-06-11 (×2): qty 28
  Filled 2019-06-11: qty 56
  Filled 2019-06-11 (×7): qty 28
  Filled 2019-06-11: qty 56

## 2019-06-11 MED ORDER — DIPHENHYDRAMINE HCL 25 MG PO CAPS
25.0000 mg | ORAL_CAPSULE | Freq: Once | ORAL | Status: AC
  Administered 2019-06-11: 02:00:00 25 mg via ORAL
  Filled 2019-06-11: qty 1

## 2019-06-11 NOTE — ED Provider Notes (Signed)
-----------------------------------------   7:56 AM on 06/11/2019 -----------------------------------------  BP 122/73   Pulse 61   Temp 98.7 F (37.1 C) (Oral)   Resp 18   Ht 6' (1.829 m)   Wt 121.7 kg   SpO2 100%   BMI 36.40 kg/m   The patient is calm and cooperative at this time.  There have been no acute events since the last update.  Awaiting disposition plan from Behavioral Medicine and/or Social Work team(s).   Lilia Pro., MD 06/11/19 [phone]

## 2019-06-11 NOTE — TOC Progression Note (Signed)
Transition of Care [facility]) - Progression Note    Patient Details  Name: Bradley Griffin MRN: [id number] Date of Birth: 1951-04-22  Transition of Care [facility]) CM/SW Contact  [name], RN Phone Number: 06/11/2019, 1:58 PM  Clinical Narrative:    Patient remains appropriate for SNF transfer. Waiting for PASSR approval.    Expected Discharge Plan: [place]    Expected Discharge Plan and Services Expected Discharge Plan: [place] arrangements for the past 2 months: Apartment                                       Social Determinants of Health (SDOH) Interventions    Readmission Risk Interventions No flowsheet data found.

## 2019-06-11 NOTE — ED Notes (Signed)
Pt given meal tray at this time 

## 2019-06-11 NOTE — ED Notes (Addendum)
Pt reports itching of his skin on his arms and legs. Pt's skin is very dry and red. Per MD Karma Greaser, okay to order hydrocortisone cream.

## 2019-06-12 DIAGNOSIS — R531 Weakness: Secondary | ICD-10-CM | POA: Diagnosis not present

## 2019-06-12 MED ORDER — OXYCODONE-ACETAMINOPHEN 5-325 MG PO TABS
1.0000 | ORAL_TABLET | Freq: Once | ORAL | Status: AC
  Administered 2019-06-12: 01:00:00 1 via ORAL
  Filled 2019-06-12: qty 1

## 2019-06-12 NOTE — ED Notes (Signed)
Advice worker notified regarding pt who is boarder.  Will follow up with SW/CM

## 2019-06-12 NOTE — ED Notes (Signed)
Checked with TOC regarding SNF placement.  They state:No updates-waiting on his PASSR To get approved from the state-most likely going to be a while

## 2019-06-12 NOTE — ED Notes (Signed)
Pt given meal tray and water 

## 2019-06-12 NOTE — ED Notes (Signed)
Pt given graham crackers at this time. °

## 2019-06-12 NOTE — ED Notes (Signed)
Pt assisted to commode by this RN and Lovena Le, NT. Pt assisted to wheel chair then stood and pivoted to commode. Pt assisted back to bed. Pt given sprite at this time.

## 2019-06-12 NOTE — Progress Notes (Signed)
PT Cancellation Note  Patient Details Name: Bradley Griffin MRN: [id number] DOB: [birth year]   Cancelled Treatment:    Reason Eval/Treat Not Completed: Medical issues which prohibited therapy(Upon entry, noted BLE not dressed, severe scaling and scabbing of BLE. Author has concerns that bed level exercises would further excoriate wounds leading to excessive bleeding/pain. RN reports MD to assess wounds this AM. WIll hold PT treatment.) Will attempt treatment again at later date/time as medically appropriate.   9:21 AM, 06/12/19 Etta Grandchild, PT, DPT Physical Therapist - [facility]  [phone] ([place])    [name] C 06/12/2019, 9:20 AM

## 2019-06-13 DIAGNOSIS — R531 Weakness: Secondary | ICD-10-CM | POA: Diagnosis not present

## 2019-06-13 LAB — GLUCOSE, CAPILLARY: Glucose-Capillary: 128 mg/dL — ABNORMAL HIGH (ref 70–99)

## 2019-06-13 MED ORDER — TRIAMCINOLONE 0.1 % CREAM:EUCERIN CREAM 1:1: TOPICAL_CREAM | Freq: Two times a day (BID) | CUTANEOUS | Status: DC

## 2019-06-13 MED ORDER — TRIAMCINOLONE ACETONIDE 0.1 % EX CREA
TOPICAL_CREAM | Freq: Two times a day (BID) | CUTANEOUS | Status: AC
  Administered 2019-06-14: 1 via TOPICAL
  Filled 2019-06-13 (×2): qty 30

## 2019-06-13 MED ORDER — HYDROCERIN EX CREA
TOPICAL_CREAM | Freq: Two times a day (BID) | CUTANEOUS | Status: AC
  Administered 2019-06-14: 1 via TOPICAL
  Filled 2019-06-13 (×2): qty 113

## 2019-06-13 NOTE — TOC Progression Note (Signed)
Transition of Care [facility]) - Progression Note    Patient Details  Name: Bradley Griffin MRN: [id number] Date of Birth: 1951-02-06  Transition of Care [facility]) CM/SW [place], RN Phone Number: 06/13/2019, 4:23 PM  Clinical Narrative:    Received call from Jerene Pitch, [place] states she had spoken to [facility] and [facility] who had openings and were willing to review the case. RN CM faxed FL2 to both facilities.    Expected Discharge Plan: [place]    Expected Discharge Plan and Services Expected Discharge Plan: [place] arrangements for the past 2 months: Apartment                                       Social Determinants of Health (SDOH) Interventions    Readmission Risk Interventions No flowsheet data found.

## 2019-06-13 NOTE — ED Notes (Signed)
Patient is resting on stretcher. NAD.

## 2019-06-13 NOTE — ED Notes (Signed)
Assisted pt to the toilet. ?

## 2019-06-13 NOTE — ED Notes (Signed)
Pt assisted to bathroom using wheelchair. Pt able to stand with one assist, mostly needing assistance with the right leg. Pt sitting on the edge of the bed at this time to allow cream to sock into back. Hydrocortisone cream out. Pharmacy updated that new tube will be needed.

## 2019-06-13 NOTE — Consult Note (Signed)
WOC Nurse Consult Note: Reason for Consult:Cancerous lesion to right temple.  Basal cell vs squamous cell carcinoma. Bilateral lower extremity eczema, self neglect  Plan to discharge to SNF.  Facial lesion bleeds easily and will implement topical silver dressing.   Lower legs will require ongoing intensive moisturizing.  Will implement hydrocortisone and Eucerin cream today for short term.  Awaiting derm consult.  Wound type:inflammatory  Cancerous to face Pressure Injury POA: NA Measurement: See photos in chart, generalized  Wound bed: Dry crusting lower legs, cancerous lesion to right face at temple Drainage (amount, consistency, odor) oozing blood from face Periwound:dry skin Dressing procedure/placement/frequency:Cleanse right face with NS.  Apply aquacel to open wound.  Secure with foam dressing. Change M/W/F Bilateral legs:  Bedside RN to cleanse legs with soap and water.  Apply hydrocortisone cream to lower legs.  Cover with Eucerin barrier cream.  Apply twice daily x 5 days.   Will not follow at this time.  Please re-consult if needed.  Domenic Moras MSN, RN, FNP-BC CWON Wound, Ostomy, Continence Nurse Pager [phone]

## 2019-06-13 NOTE — ED Notes (Signed)
Provided pt w/ water and graham crackers

## 2019-06-13 NOTE — NC FL2 (Signed)
  [place] LEVEL OF CARE SCREENING TOOL     IDENTIFICATION  Patient Name: Bradley Griffin Birthdate: Sep 27, 1950 Sex: male Admission Date (Current Location): 06/05/2019  [facility] and [place] Number:  Engineering geologist and Address:         Provider Number: [id number]  Attending Physician Name and Address:  No att. providers found  Relative Name and Phone Number:       Current Level of Care: Hospital Recommended Level of Care: [place] Prior Approval Number:    Date Approved/Denied:   PASRR Number: [id number] F expires 07/13/19  Discharge Plan: SNF    Current Diagnoses: Patient Active Problem List   Diagnosis Date Noted  . Bipolar 2 disorder ([place]) 02/10/2019  . Borderline personality disorder ([place]) 02/10/2019  . Basal cell carcinoma 02/10/2019  . Diabetes ([place]) 02/10/2019  . Major depressive disorder, recurrent severe without psychotic features ([place]) 02/07/2019  . Generalized weakness 09/19/2018    Orientation RESPIRATION BLADDER Height & Weight     Self, Time, Situation, Place  Normal Continent Weight: 121.7 kg Height:  6' (182.9 cm)  BEHAVIORAL SYMPTOMS/MOOD NEUROLOGICAL BOWEL NUTRITION STATUS      Continent Diet  AMBULATORY STATUS COMMUNICATION OF NEEDS Skin   Extensive Assist Verbally Normal                       Personal Care Assistance Level of Assistance  Bathing, Dressing Bathing Assistance: Limited assistance   Dressing Assistance: Limited assistance     Functional Limitations Info             SPECIAL CARE FACTORS FREQUENCY  PT (By licensed PT), OT (By licensed OT)     PT Frequency: min 5/weekly OT Frequency: min 5/weekly            Contractures      Additional Factors Info                  Current Medications (06/13/2019):  This is the current hospital active medication list Current Facility-Administered Medications  Medication Dose Route Frequency Provider Last Rate Last Admin  .  acetaminophen (TYLENOL) tablet 650 mg  650 mg Oral Q6H PRN Blake Divine, MD   650 mg at 06/11/19 1418  . hydrocortisone cream 1 %   Topical BID Hinda Kehr, MD   Given at 06/13/19 0146  . sodium chloride flush (NS) 0.9 % injection 3 mL  3 mL Intravenous Once Harvest Dark, MD       No current outpatient medications on file.     Discharge Medications: Please see discharge summary for a list of discharge medications.  Relevant Imaging Results:  Relevant Lab Results:   Additional Information [id number]  [name], RN

## 2019-06-13 NOTE — ED Notes (Signed)
Patient c/o right upper chest pain when sitting upright. Dr. Joan Mayans informed. Patient giving lunch tray.

## 2019-06-13 NOTE — ED Notes (Signed)
Report given to Mitch RN 

## 2019-06-13 NOTE — ED Notes (Signed)
Patient given dinner. NAD.

## 2019-06-13 NOTE — TOC Progression Note (Signed)
Transition of Care [facility]) - Progression Note    Patient Details  Name: Bradley Griffin MRN: [id number] Date of Birth: 1950/12/16  [facility]) CM/SW Contact  [name], RN Phone Number: 06/13/2019, 11:41 AM  Clinical Narrative:    Patient having increased pain and drainage from wounds on legs. Unable to participate in therapy due to pain and concerns for worsening of wounds. RN CM talked with Jerene Pitch,  [facility] Worker, physical therapy and nurse regarding discharge plan and potential LTC placement instead of skilled rehab. RN CM suggested wound care consult to evaluate legs prior to transfer to LTC. Jerene Pitch, APS SW calling LTC facilities for potential placement.    Expected Discharge Plan: [place]    Expected Discharge Plan and Services Expected Discharge Plan: [place] arrangements for the past 2 months: Apartment                                       Social Determinants of Health (SDOH) Interventions    Readmission Risk Interventions No flowsheet data found.

## 2019-06-14 DIAGNOSIS — R531 Weakness: Secondary | ICD-10-CM | POA: Diagnosis not present

## 2019-06-14 MED ORDER — TRAZODONE HCL 50 MG PO TABS
50.0000 mg | ORAL_TABLET | Freq: Every day | ORAL | Status: DC
  Administered 2019-06-14 – 2019-06-25 (×13): 50 mg via ORAL
  Filled 2019-06-14 (×13): qty 1

## 2019-06-14 NOTE — Social Work (Signed)
TOC CM/SW spoke to [place] from [facility] [phone] who noted that she is going to work on this case on Monday.   Has one medicaid bed for long term care.   Social worker to follow-up on Monday, 06/14/2018.   Berenice Bouton, MSW, LCSW  CSW ED # [phone]

## 2019-06-14 NOTE — ED Notes (Signed)
Pt up to toilet at this time. Pt cleaned up and linens changed.

## 2019-06-14 NOTE — Progress Notes (Signed)
Physical Therapy Treatment Patient Details Name: Bradley Griffin MRN: [id number] DOB: 1950/10/14 Today's Date: 06/14/2019    History of Present Illness Bradley Griffin is a 45yoM presenting to ED 06/05/19 from home with complaints of worsening redness, swelling, weeping to BLE x6 weeks, pt also has known basal cell carcinoma to face lateral to right eye for which he has been unable to seek care d/t financial limtations, no phone, no transportation, and recently no electricity at his apartment. PMH includes skin cancer, bipolar disorder, DM, depresison.    PT Comments    Pt asleep upon entry but easy to wake. Pt agreeable to participate, ultimate ends up being able to tolerate more activity than he suspects. Pt now s/p wound care consult with recs for topicals, but no dressings to legs. Pt kept in room for infection control reasons d/t undiagnosed shedding leg wounds. Dermatology consult still has not taken place despite recommendations since 12/30 (prior admission). MinA help with RLE for bed mobility. Pt does well with transfers at supervision level and RW use. Birkenstocks used for AMB footwear. Pt able to AMB two 15ft intervals with rest between. Pt assisted to toilet and author provides quality assurance s/p pt's independent pericare. Pt progressing well in general, still largely limited in activity tolerance.     Follow Up Recommendations  SNF     Equipment Recommendations  3in1 (PT);Other (comment)    Recommendations for Other Services       Precautions / Restrictions Precautions Precautions: Fall Restrictions Weight Bearing Restrictions: No    Mobility  Bed Mobility Overal bed mobility: Needs Assistance Bed Mobility: Supine to Sit;Sit to Supine     Supine to sit: Min assist;HOB elevated Sit to supine: HOB elevated;Min assist   General bed mobility comments: Has difficulty moving RLE without assist  Transfers Overall transfer level: Needs assistance Equipment used:  Rolling walker (2 wheeled) Transfers: Sit to/from Stand Sit to Stand: Supervision            Ambulation/Gait Ambulation/Gait assistance: Supervision Gait Distance (Feet): 60 Feet Assistive device: Rolling walker (2 wheeled) Gait Pattern/deviations: WFL(Within Functional Limits)     General Gait Details: remained in room d/t shedding of leg scales onto floor, uncovered wounds.   Stairs             Wheelchair Mobility    Modified Rankin (Stroke Patients Only)       Balance Overall balance assessment: Mild deficits observed, not formally tested;Modified Independent                                          Cognition Arousal/Alertness: Awake/alert Behavior During Therapy: WFL for tasks assessed/performed Overall Cognitive Status: Within Functional Limits for tasks assessed                                        Exercises Other Exercises Other Exercises: STS from EOB/toilet; 1x5 Other Exercises: AMB with RW in room 2x88ft, c seated recovery interval    General Comments        Pertinent Vitals/Pain Pain Assessment: 0-10 Faces Pain Scale: Hurts whole lot Pain Location: Right lateral to anterior chest burning with postion changes, worse with standing Pain Descriptors / Indicators: Burning;Sharp Pain Intervention(s): Limited activity within patient's tolerance;Monitored during session    Home Living  Prior Function            PT Goals (current goals can now be found in the care plan section) Acute Rehab PT Goals Patient Stated Goal: get into a home PT Goal Formulation: With patient Time For Goal Achievement: 06/20/19 Potential to Achieve Goals: Good Progress towards PT goals: Progressing toward goals    Frequency    Min 2X/week      PT Plan Current plan remains appropriate    Co-evaluation              AM-PAC PT "6 Clicks" Mobility   Outcome Measure  Help needed turning  from your back to your side while in a flat bed without using bedrails?: A Little Help needed moving from lying on your back to sitting on the side of a flat bed without using bedrails?: A Little Help needed moving to and from a bed to a chair (including a wheelchair)?: A Little Help needed standing up from a chair using your arms (e.g., wheelchair or bedside chair)?: A Little Help needed to walk in hospital room?: A Little Help needed climbing 3-5 steps with a railing? : A Little 6 Click Score: 18    End of Session   Activity Tolerance: Patient tolerated treatment well;Patient limited by pain;Patient limited by fatigue Patient left: in bed;with call bell/phone within reach Nurse Communication: Mobility status PT Visit Diagnosis: Muscle weakness (generalized) (M62.81);Difficulty in walking, not elsewhere classified (R26.2);Unsteadiness on feet (R26.81)     Time: [id number] PT Time Calculation (min) (ACUTE ONLY): 27 min  Charges:  $Therapeutic Exercise: 8-22 mins $Therapeutic Activity: 8-22 mins                     11:51 AM, 06/14/19 Etta Grandchild, PT, DPT Physical Therapist - [facility]  [phone] ([place])     [name] 06/14/2019, 11:48 AM

## 2019-06-14 NOTE — ED Notes (Signed)
Pt given cup of water 

## 2019-06-14 NOTE — ED Notes (Signed)
Pt still resting in bed with eyes closed and snoring respirations- lunch tray placed on bedside table

## 2019-06-14 NOTE — ED Notes (Signed)
PT at bedside.

## 2019-06-14 NOTE — ED Notes (Signed)
Legs washed and meds applied

## 2019-06-14 NOTE — ED Notes (Signed)
Call bell light answered: pt requesting meds to help sleep; EDP to be notified  Pharm will be contacted to send cream

## 2019-06-14 NOTE — ED Notes (Addendum)
Pt given cup of water 

## 2019-06-14 NOTE — ED Notes (Signed)
Pt given fresh blankets, snack, drink, lights dimmed for comfort.

## 2019-06-14 NOTE — ED Notes (Signed)
Pt given diet ginger ale.

## 2019-06-14 NOTE — ED Notes (Signed)
Pt given dinner tray and diet ginger ale 

## 2019-06-15 DIAGNOSIS — R531 Weakness: Secondary | ICD-10-CM | POA: Diagnosis not present

## 2019-06-15 NOTE — ED Notes (Signed)
Pt given meal tray and sprite to drink. Pt wishes to wait until after he completes his breakfast to apply leg cream.

## 2019-06-15 NOTE — ED Notes (Signed)
PT's legs cleansed with warm water and soap and blotted dry to apply creams.

## 2019-06-15 NOTE — ED Notes (Signed)
Pt given meal tray and water to drink.  

## 2019-06-15 NOTE — ED Notes (Signed)
PT cleaned up, assisted to restroom and given meal tray by Joellen [place], NT

## 2019-06-15 NOTE — ED Notes (Signed)
Called pharmacy to request more of pt's leg creams

## 2019-06-16 DIAGNOSIS — R531 Weakness: Secondary | ICD-10-CM | POA: Diagnosis not present

## 2019-06-16 NOTE — Progress Notes (Signed)
Physical Therapy Treatment Patient Details Name: Bradley Griffin MRN: [id number] DOB: 1951-03-14 Today's Date: 06/16/2019    History of Present Illness Bradley Griffin is a 74yoM presenting to ED 06/05/19 from home with complaints of worsening redness, swelling, weeping to BLE x6 weeks, pt also has known basal cell carcinoma to face lateral to right eye for which he has been unable to seek care d/t financial limtations, no phone, no transportation, and recently no electricity at his apartment. PMH includes skin cancer, bipolar disorder, DM, depresison.    PT Comments    Pt pleasant and motivated to participate during the session.  Pt's SpO2 and HR on room air WNL during the session with no adverse symptoms noted.  Pt required extra time and effort with bed mobility tasks as well as min A getting from sup to sit.  Pt required cuing for proper sequencing with the RW during transfers and gait but was steady with good control and stability with both.  Pt will benefit from PT services in a SNF setting upon discharge to safely address deficits listed in patient problem list for decreased caregiver assistance and eventual return to PLOF.    Follow Up Recommendations  SNF     Equipment Recommendations  3in1 (PT)    Recommendations for Other Services       Precautions / Restrictions Precautions Precautions: Fall Restrictions Weight Bearing Restrictions: No    Mobility  Bed Mobility Overal bed mobility: Needs Assistance Bed Mobility: Rolling Rolling: Supervision   Supine to sit: Min assist Sit to supine: Supervision   General bed mobility comments: Min A to come to full upright sitting  Transfers Overall transfer level: Needs assistance Equipment used: Rolling walker (2 wheeled) Transfers: Sit to/from Stand Sit to Stand: Supervision         General transfer comment: Min verbal cues for sequencing for proper hand placement  Ambulation/Gait Ambulation/Gait assistance:  Supervision Gait Distance (Feet): 60 Feet Assistive device: Rolling walker (2 wheeled) Gait Pattern/deviations: Step-through pattern;Decreased step length - right;Decreased step length - left Gait velocity: decreased   General Gait Details: Slow cadence but steady with amb with min verbal cues for amb closer to the RW for safety   Stairs             Wheelchair Mobility    Modified Rankin (Stroke Patients Only)       Balance Overall balance assessment: Mild deficits observed, not formally tested Sitting-balance support: Feet supported;Bilateral upper extremity supported Sitting balance-Leahy Scale: Good     Standing balance support: Bilateral upper extremity supported;During functional activity Standing balance-Leahy Scale: Good                              Cognition Arousal/Alertness: Awake/alert Behavior During Therapy: WFL for tasks assessed/performed Overall Cognitive Status: Within Functional Limits for tasks assessed                                        Exercises Total Joint Exercises Ankle Circles/Pumps: AROM;Strengthening;Both;10 reps;15 reps Quad Sets: Strengthening;Both;10 reps;15 reps Gluteal Sets: Strengthening;Both;10 reps;15 reps Hip ABduction/ADduction: Strengthening;Both;10 reps Straight Leg Raises: Strengthening;Both;10 reps Long Arc Quad: AROM;Strengthening;Both;10 reps;15 reps Knee Flexion: AROM;Strengthening;Both;10 reps;15 reps Marching in Standing: AROM;Both;5 reps;Standing Other Exercises Other Exercises: HEP education and review for BLE APs, QS, and GS x 10 each every 1-2 hours daily and  BLE SLR and hip abd/add x 10 each 2x/day    General Comments        Pertinent Vitals/Pain Pain Assessment: No/denies pain    Home Living                      Prior Function            PT Goals (current goals can now be found in the care plan section) Progress towards PT goals: Progressing toward goals     Frequency    Min 2X/week      PT Plan Current plan remains appropriate    Co-evaluation              AM-PAC PT "6 Clicks" Mobility   Outcome Measure  Help needed turning from your back to your side while in a flat bed without using bedrails?: A Little Help needed moving from lying on your back to sitting on the side of a flat bed without using bedrails?: A Little Help needed moving to and from a bed to a chair (including a wheelchair)?: A Little Help needed standing up from a chair using your arms (e.g., wheelchair or bedside chair)?: A Little Help needed to walk in hospital room?: A Little Help needed climbing 3-5 steps with a railing? : A Little 6 Click Score: 18    End of Session Equipment Utilized During Treatment: Gait belt Activity Tolerance: Patient tolerated treatment well Patient left: in bed;with call bell/phone within reach Nurse Communication: Mobility status PT Visit Diagnosis: Muscle weakness (generalized) (M62.81);Difficulty in walking, not elsewhere classified (R26.2);Unsteadiness on feet (R26.81)     Time: [id number] PT Time Calculation (min) (ACUTE ONLY): 27 min  Charges:  $Gait Training: 8-22 mins $Therapeutic Exercise: 8-22 mins                     D. Scott Numa Heatwole PT, DPT 06/16/19, 3:32 PM

## 2019-06-16 NOTE — ED Notes (Signed)
Patient is resting quietly in room with no apparent acute distress.  Pt had just used the toilet, assisted back into bed, requested to be covered with blanket.  Patient is expressing no other needs at this time. Bed is in lowest position, call light within reach.

## 2019-06-16 NOTE — Care Management (Signed)
TOC CM LVMM with Claiborne Billings from [facility] regarding placement in medicaid bed.

## 2019-06-16 NOTE — ED Notes (Signed)
Patient assisted to use toilet. Ambulatory with walker and standby assist. Patient with unmeasured bowel movement and void. Patient repositioned back in bed. Given breakfast tray and ginger ale. No further needs expressed at this time.

## 2019-06-16 NOTE — ED Provider Notes (Signed)
-----------------------------------------   1:10 PM on 06/16/2019 -----------------------------------------   Blood pressure 120/63, pulse (!) 49, temperature 98.2 F (36.8 C), temperature source Oral, resp. rate (!) 22, height 6' (1.829 m), weight 121.7 kg, SpO2 97 %.  The patient is calm and cooperative at this time.  There have been no acute events since the last update.  Awaiting disposition plan from Social Work team(s).    Merlyn Lot, MD 06/16/19 1311

## 2019-06-16 NOTE — ED Notes (Signed)
Patient given dinner tray. Sitting up in bed eating at this time. No further needs expressed.

## 2019-06-17 DIAGNOSIS — R531 Weakness: Secondary | ICD-10-CM | POA: Diagnosis not present

## 2019-06-17 NOTE — Care Management (Signed)
TOC RN CM: Incoming call from [place], [place] requesting update on placement. [facility] had declined and [facility] was still pending. CM will extend search into other facilities.

## 2019-06-17 NOTE — ED Notes (Signed)
Patient is resting quietly in room watching TV with no apparent acute distress and is expressing no needs at this time.  Bed in lowest position, call light within reach.

## 2019-06-17 NOTE — Progress Notes (Signed)
Physical Therapy Treatment Patient Details Name: Bradley Griffin MRN: [id number] DOB: 08/04/1950 Today's Date: 06/17/2019    History of Present Illness Bradley Griffin is a 48yoM presenting to ED 06/05/19 from home with complaints of worsening redness, swelling, weeping to BLE x6 weeks, pt also has known basal cell carcinoma to face lateral to right eye for which he has been unable to seek care d/t financial limtations, no phone, no transportation, and recently no electricity at his apartment. PMH includes skin cancer, bipolar disorder, DM, depresison.    PT Comments    Pt continues to progress towards goals and is motivated to participate throughout.  Pt's SpO2 on room air remained 98-99% during the session with no adverse symptoms noted.  Pt gave good effort with mobility tasks and required no physical assistance but did fatigue quickly with amb.  Pt will benefit from PT services in a SNF setting upon discharge to safely address deficits listed in patient problem list for decreased caregiver assistance and eventual return to PLOF.    Follow Up Recommendations  SNF     Equipment Recommendations  3in1 (PT)    Recommendations for Other Services       Precautions / Restrictions Precautions Precautions: Fall Restrictions Weight Bearing Restrictions: No    Mobility  Bed Mobility Overal bed mobility: Needs Assistance Bed Mobility: Rolling Rolling: Supervision   Supine to sit: Min assist Sit to supine: Supervision   General bed mobility comments: Min A to come to full upright sitting  Transfers Overall transfer level: Needs assistance Equipment used: Rolling walker (2 wheeled) Transfers: Sit to/from Stand Sit to Stand: Supervision         General transfer comment: Good carryover regarding proper sequencing with good control and stability  Ambulation/Gait Ambulation/Gait assistance: Supervision Gait Distance (Feet): 60 Feet Assistive device: Rolling walker (2 wheeled) Gait  Pattern/deviations: Step-through pattern;Decreased step length - right;Decreased step length - left Gait velocity: decreased   General Gait Details: Slow cadence with amb with min verbal cues for amb closer to the RW for safety   Stairs             Wheelchair Mobility    Modified Rankin (Stroke Patients Only)       Balance Overall balance assessment: Mild deficits observed, not formally tested Sitting-balance support: Feet supported;Bilateral upper extremity supported Sitting balance-Leahy Scale: Good Sitting balance - Comments: steady sitting edge of gurney with feet supported and BUE support   Standing balance support: Bilateral upper extremity supported;During functional activity Standing balance-Leahy Scale: Good Standing balance comment: Min lean on the RW for support                            Cognition Arousal/Alertness: Awake/alert Behavior During Therapy: WFL for tasks assessed/performed Overall Cognitive Status: Within Functional Limits for tasks assessed                                        Exercises Total Joint Exercises Ankle Circles/Pumps: AROM;Strengthening;Both;10 reps Quad Sets: Strengthening;Both;10 reps Gluteal Sets: Strengthening;Both;10 reps Towel Squeeze: Strengthening;Both;10 reps Hip ABduction/ADduction: Strengthening;Both;10 reps Straight Leg Raises: Strengthening;Both;10 reps Long Arc Quad: AROM;Strengthening;Both;10 reps Knee Flexion: AROM;Strengthening;Both;10 reps Marching in Standing: AROM;Both;10 reps;Standing Bridges: Strengthening;Both;10 reps Other Exercises Other Exercises: HEP review for BLE APs, QS, and GS x 10 each every 1-2 hours daily and BLE SLR and hip  abd/add x 10 each 2x/day    General Comments        Pertinent Vitals/Pain Pain Assessment: No/denies pain    Home Living                      Prior Function            PT Goals (current goals can now be found in the care  plan section) Progress towards PT goals: Progressing toward goals    Frequency    Min 2X/week      PT Plan Current plan remains appropriate    Co-evaluation              AM-PAC PT "6 Clicks" Mobility   Outcome Measure  Help needed turning from your back to your side while in a flat bed without using bedrails?: A Little Help needed moving from lying on your back to sitting on the side of a flat bed without using bedrails?: A Little Help needed moving to and from a bed to a chair (including a wheelchair)?: A Little Help needed standing up from a chair using your arms (e.g., wheelchair or bedside chair)?: A Little Help needed to walk in hospital room?: A Little Help needed climbing 3-5 steps with a railing? : A Little 6 Click Score: 18    End of Session Equipment Utilized During Treatment: Gait belt Activity Tolerance: Patient tolerated treatment well Patient left: in bed;with call bell/phone within reach Nurse Communication: Mobility status PT Visit Diagnosis: Muscle weakness (generalized) (M62.81);Difficulty in walking, not elsewhere classified (R26.2);Unsteadiness on feet (R26.81)     Time: 1535-1600 PT Time Calculation (min) (ACUTE ONLY): 25 min  Charges:  $Therapeutic Exercise: 23-37 mins                     D. Scott Jaymi Tinner PT, DPT 06/17/19, 4:03 PM

## 2019-06-17 NOTE — ED Notes (Signed)
Assisted pt to toilet and back to bed.  Lights dimmed and door closed per pt request.

## 2019-06-17 NOTE — ED Notes (Signed)
Pt's legs washed with soap and water and dried before medicating.

## 2019-06-17 NOTE — ED Notes (Signed)
Patient's legs washed and dried prior to applying medicated lotions.  There is a significant amount of dried skin/scabs flaking off of legs and feet.

## 2019-06-17 NOTE — ED Notes (Signed)
EVS has been called to clean pt's room. She will come after she finishes Covid cleans.

## 2019-06-17 NOTE — ED Notes (Signed)
Patient is resting quietly in room with no apparent acute distress and is expressing no needs at this time.  Bed in lowest position, bed alarm on, call light within reach.

## 2019-06-17 NOTE — ED Notes (Signed)
Patient's legs bathed and patted dry prior to applying medications.

## 2019-06-17 NOTE — ED Notes (Signed)
Pt assisted to toilet; then given dinner tray and diet sprite. Partial linen change

## 2019-06-17 NOTE — ED Notes (Signed)
Patient instructed to press call bell for assistance to use the toilet.  Bed alarm is set to go off if patient gets out of bed.  Fall bracelet placed on right wrist.

## 2019-06-17 NOTE — ED Notes (Signed)
Assisted patient to lie down and cover with blanket after using toilet.  Lights dimmed, TV on.  Bed is in lowest position, call light within reach.

## 2019-06-17 NOTE — ED Notes (Signed)
Pt with yellow High Fall Risk bracelet on right wrist; bed alarm active. Pt declines to wear yellow High Fall Risk slip-resistant socks d/t skin issues.

## 2019-06-17 NOTE — ED Notes (Signed)
Pt with large skin cancer to right temporal area with dried blood noted. Pt has been states "I pick at it". Area cleaned and Dr. Charna Archer in to eval. Covered with nonadhering dressing per edp order. Pt given sandwich tray and fresh water.

## 2019-06-18 DIAGNOSIS — R531 Weakness: Secondary | ICD-10-CM | POA: Diagnosis not present

## 2019-06-18 NOTE — ED Notes (Signed)
Pt provided with meal tray and fluids at this time. Pt A/Ox4.

## 2019-06-18 NOTE — Care Management (Addendum)
TOC RN CM: Call to Tammy @ [facility] for potential [facility] placement. Tammy requested progress notes, FL2 and PT notes be faxed to [facility] @ [phone].   Faxed all requested information to [facility] for review. Potential placement at [facility]

## 2019-06-18 NOTE — ED Notes (Signed)
Vital signs stable.Patient denies pain and is resting comfortably. Patient is resting comfortably. Pt given meal tray at this time

## 2019-06-18 NOTE — ED Notes (Signed)
Pt provided with lunch tray and fluids.

## 2019-06-18 NOTE — ED Notes (Signed)
This RN ambulated pt to bathroom. Pt w/ steady gait and minimal assistance.

## 2019-06-18 NOTE — ED Notes (Signed)
Pt walked to toilet with a walker. Pt ambulatory in room with assistance/walker- this RN at bedside. Pt back on bed safely at this time. This Rn will continue to monitor pt.

## 2019-06-18 NOTE — Care Management (Addendum)
TOC CM RN: Incoming call from Austwell @ [facility] states she will review this patient for possible placement and call back.   Returned call from [facility] states patient is more ALF appropriate at this time.    TOC will continue search for placement.

## 2019-06-19 DIAGNOSIS — R531 Weakness: Secondary | ICD-10-CM | POA: Diagnosis not present

## 2019-06-19 NOTE — ED Notes (Signed)
Standby assist while patient ambulated to bathroom.

## 2019-06-19 NOTE — ED Notes (Signed)
Pt eating, did not want to get OOB to chair. This RN told pt we will plan to get him OOB after lunch. Pt eating, did not want hot dog, getting replacement meal.

## 2019-06-19 NOTE — Care Management (Addendum)
TOC RN CM: Updated patient on discharge plan and potential placement for [facility]. Patient in agreeance and appreciative of update. Requested APS worker go to apartment and get his glasses which are on the kitchen bar. RN CM will outreach to [place] worker, Jerene Pitch, and request this done.   TOC RN CM: Bridgette Habermann [phone]) APS social worker with update on discharge plan and request for eyeglasses. SW advised she would check with her supervisor about getting glasses but she did not think it was allowable. Brooke requested Fl2 be faxed to [phone] to assist with search.

## 2019-06-19 NOTE — ED Notes (Signed)
Mr Bradley Griffin is still up in chair and has finished his breakfast.  Gave him wet wipes to bathe.

## 2019-06-19 NOTE — ED Notes (Signed)
Checked on patient, he is in bed watching TV resting comfortably. Patient denies any needs at present. Call bell within reach, NAD

## 2019-06-19 NOTE — ED Provider Notes (Signed)
-----------------------------------------   6:50 AM on 06/19/2019 -----------------------------------------   Blood pressure 121/68, pulse (!) 47, temperature 97.7 F (36.5 C), temperature source Oral, resp. rate 20, height 6' (1.829 m), weight 121.7 kg, SpO2 100 %.  The patient is sleeping at this time.  There have been no acute events since the last update.  Awaiting disposition plan from Social Work team(s).   Paulette Blanch, MD 06/19/19 [phone]

## 2019-06-19 NOTE — ED Notes (Signed)
Patient's legs washed and dried prior to applying hydrocortisone cream; bed pad changed.

## 2019-06-19 NOTE — ED Notes (Addendum)
Pt OOB to toilet with stand by assist, pt to chair with walker and standby assist. Bedding and gown changed.

## 2019-06-19 NOTE — ED Notes (Signed)
Pt ate all of dinner, pt ambulated in room with stand by assist, then pt assisted to bed. Pt with no c/o pain.

## 2019-06-20 DIAGNOSIS — R531 Weakness: Secondary | ICD-10-CM | POA: Diagnosis not present

## 2019-06-20 NOTE — Progress Notes (Signed)
Physical Therapy Treatment Patient Details Name: Bradley Griffin MRN: [id number] DOB: [birth year] Today's Date: 06/20/2019    History of Present Illness Bradley Griffin is a 16yoM presenting to ED 06/05/19 from home with complaints of worsening redness, swelling, weeping to BLE x6 weeks, pt also has known basal cell carcinoma to face lateral to right eye for which he has been unable to seek care d/t financial limtations, no phone, no transportation, and recently no electricity at his apartment. PMH includes skin cancer, bipolar disorder, DM, depresison.    PT Comments    Pt was able to ambulate w/o AD this date and generally seemed to do better than any previous PT sessions.  He had some subjective fatigue with multiple bouts of in-room ambulation and mobility, but vitals were stable and appropriate the entire time.  Pt did not wish to push himself with exercises after the functional tasks, but again reports feeling that he is making improvements daily and is getting around better (though not yet at baseline).  Pt did have one episode of stagger stepping during a turn that he was able to self arrest, but otherwise showed good safety with modest bouts of ambulation.   Follow Up Recommendations  SNF     Equipment Recommendations       Recommendations for Other Services       Precautions / Restrictions Precautions Precautions: Fall Restrictions Weight Bearing Restrictions: No    Mobility  Bed Mobility Overal bed mobility: Modified Independent Bed Mobility: Rolling Rolling: Supervision   Supine to sit: Min assist Sit to supine: Supervision   General bed mobility comments: Pt was able to transition from and back to supine w/o assist, rail use with minimal extra time needed  Transfers Overall transfer level: Modified independent Equipment used: None Transfers: Sit to/from Stand Sit to Stand: Supervision         General transfer comment: Pt was able to rise to standing w/o AD on 3  seperate efforts this session.  No LOBs and good overall confidence and sequencing.  Ambulation/Gait Ambulation/Gait assistance: Supervision Gait Distance (Feet): 60 Feet Assistive device: None       General Gait Details: Slow, lumbering gait but no LOBs, no excessive fatigue (sats remain in the high 90s, HR 70s most of the time)  Pt reports that he is still not at his baseline, but much closer today than he has been    Marine scientist Rankin (Stroke Patients Only)       Balance Overall balance assessment: Mild deficits observed, not formally tested   Sitting balance-Leahy Scale: Good       Standing balance-Leahy Scale: Good                              Cognition Arousal/Alertness: Awake/alert Behavior During Therapy: WFL for tasks assessed/performed Overall Cognitive Status: Within Functional Limits for tasks assessed                                        Exercises      General Comments        Pertinent Vitals/Pain Pain Assessment: No/denies pain Pain Location: reports R chest pain has improved significantly, no pain during PT session    Home Living  Prior Function            PT Goals (current goals can now be found in the care plan section) Progress towards PT goals: Progressing toward goals    Frequency    Min 2X/week      PT Plan Current plan remains appropriate    Co-evaluation              AM-PAC PT "6 Clicks" Mobility   Outcome Measure  Help needed turning from your back to your side while in a flat bed without using bedrails?: None Help needed moving from lying on your back to sitting on the side of a flat bed without using bedrails?: None Help needed moving to and from a bed to a chair (including a wheelchair)?: A Little Help needed standing up from a chair using your arms (e.g., wheelchair or bedside chair)?: A Little Help  needed to walk in hospital room?: A Little Help needed climbing 3-5 steps with a railing? : A Little 6 Click Score: 20    End of Session Equipment Utilized During Treatment: Gait belt Activity Tolerance: Patient tolerated treatment well Patient left: with call bell/phone within reach;with bed alarm set Nurse Communication: Mobility status PT Visit Diagnosis: Muscle weakness (generalized) (M62.81);Difficulty in walking, not elsewhere classified (R26.2);Unsteadiness on feet (R26.81)     Time: [id number] PT Time Calculation (min) (ACUTE ONLY): 24 min  Charges:  $Gait Training: 8-22 mins $Therapeutic Activity: 8-22 mins                     Kreg Shropshire, DPT 06/20/2019, 5:32 PM

## 2019-06-20 NOTE — ED Notes (Signed)
Patient is asleep at present, no distress. Call bell within reach, bed alarm on. Will continue to round

## 2019-06-20 NOTE — ED Notes (Signed)
Patient continues to rest comfortably. No requests at this time. Call bell in reach, bed in lowest position, bed alarm on

## 2019-06-20 NOTE — ED Notes (Signed)
This note is not being shared with the patient for the following reason:  Pt assisted back to bed from toilet, new chux pad placed on patient's bed, a cup of ice water given to patient, no distress noted, pt ambulated well with his walker

## 2019-06-20 NOTE — ED Notes (Signed)
Patient assisted to restroom with walker, no difficulties. Wanted to walk around room a little. Patient refuses to wear yellow socks despite attempts to have patient wear them. Patient positioned back in bed with bed alarm on and call bell within reach.

## 2019-06-20 NOTE — ED Notes (Signed)
Pt given meal tray and grape juice to drink at this time

## 2019-06-20 NOTE — NC FL2 (Signed)
  [place] LEVEL OF CARE SCREENING TOOL     IDENTIFICATION  Patient Name: Bradley Griffin Birthdate: 03-29-1951 Sex: male Admission Date (Current Location): 06/05/2019  [facility] and [place] Number:  Engineering geologist and Address:  [facility], [address], [place], [place] [address]      Provider Number: [id number]  Attending Physician Name and Address:  No att. providers found  Relative Name and Phone Number:       Current Level of Care: Hospital Recommended Level of Care: [facility] Prior Approval Number:    Date Approved/Denied:   PASRR Number: [id number] F Expires 07/13/19  Discharge Plan: Other (Comment)(Family Care Home)    Current Diagnoses: Patient Active Problem List   Diagnosis Date Noted  . Bipolar 2 disorder ([place]) 02/10/2019  . Borderline personality disorder ([place]) 02/10/2019  . Basal cell carcinoma 02/10/2019  . Diabetes ([place]) 02/10/2019  . Major depressive disorder, recurrent severe without psychotic features ([place]) 02/07/2019  . Generalized weakness 09/19/2018    Orientation RESPIRATION BLADDER Height & Weight     Self, Time, Situation, Place  Normal Continent Weight: 121.7 kg Height:  6' (182.9 cm)  BEHAVIORAL SYMPTOMS/MOOD NEUROLOGICAL BOWEL NUTRITION STATUS      Continent Diet  AMBULATORY STATUS COMMUNICATION OF NEEDS Skin   Supervision Verbally Normal(Eczema on both legs-managed with cream)                       Personal Care Assistance Level of Assistance  Bathing, Dressing Bathing Assistance: Limited assistance   Dressing Assistance: Limited assistance     Functional Limitations Info  Sight(Glasses) Sight Info: Adequate        SPECIAL CARE FACTORS FREQUENCY                   Contractures      Additional Factors Info                  Current Medications (06/20/2019):  This is the current hospital active medication list Current Facility-Administered Medications   Medication Dose Route Frequency Provider Last Rate Last Admin  . acetaminophen (TYLENOL) tablet 650 mg  650 mg Oral Q6H PRN Blake Divine, MD   650 mg at 06/11/19 1418  . hydrocortisone cream 1 %   Topical BID Hinda Kehr, MD   Given at 06/20/19 1031  . sodium chloride flush (NS) 0.9 % injection 3 mL  3 mL Intravenous Once Harvest Dark, MD      . traZODone (DESYREL) tablet 50 mg  50 mg Oral QHS Alfred Levins, [place], MD   50 mg at 06/19/19 2252   No current outpatient medications on file.     Discharge Medications: Please see discharge summary for a list of discharge medications.  Relevant Imaging Results:  Relevant Lab Results:   Additional Information [id number]  [name], RN

## 2019-06-20 NOTE — Care Management (Signed)
TOC RN CM: Faxed updated Fl2 to [employer], APS SW and Roderic Palau at [facility] for [facility] placement.

## 2019-06-21 DIAGNOSIS — R531 Weakness: Secondary | ICD-10-CM | POA: Diagnosis not present

## 2019-06-21 NOTE — ED Notes (Signed)
Pt assisted to toilet 

## 2019-06-21 NOTE — ED Notes (Signed)
Pt given breakfast tray and diet ginger ale.

## 2019-06-21 NOTE — ED Notes (Signed)
Hydrocortisone cream applied to bilateral lower extremities. Pt legs are pink and scaly from the knee down. Pt denies any itchiness or discomfort. Pt provided lunch tray.

## 2019-06-21 NOTE — ED Notes (Signed)
Called dietary to send pt breakfast tray

## 2019-06-21 NOTE — ED Notes (Addendum)
Encouraged pt to get out of bed w/ assistance and stretch if able, education given regarding importance of exercising mobility while in the hospital- pt verbalizes understanding and states he did get up and ambulate "a little bit earlier today."

## 2019-06-21 NOTE — ED Notes (Signed)
At some point breakfast tray was given to pt by dietary - pt ate entire meal and drank 2 juices/ginger ale

## 2019-06-22 DIAGNOSIS — R531 Weakness: Secondary | ICD-10-CM | POA: Diagnosis not present

## 2019-06-22 NOTE — ED Notes (Signed)
This RN offered to take a CBG for pt. Pt declined at this time.

## 2019-06-22 NOTE — ED Notes (Signed)
Pt given diet ginger ale per request

## 2019-06-22 NOTE — ED Notes (Addendum)
Pt with dry, flaky skin, over entire body. Raised red rash on abdomen and arms. Eucerin lotion applied to pt. Pt with large black growth on left elbow and right side of head. Pt seen picking at growths, toes, and scabs. Pt with long yellowed toenails, feet and toes dry and flaky. Eucerin lotion applied.

## 2019-06-22 NOTE — ED Provider Notes (Signed)
Vitals:   06/21/19 1255 06/21/19 2045  BP: 110/69 111/65  Pulse: (!) 50 (!) 50  Resp: 18 18  Temp:  98.4 F (36.9 C)  SpO2: 98% 97%     Patient is resting comfortably in no distress at this time.  Currently awaiting final disposition and planning through the transitional care team.   Delman Kitten, MD 06/22/19 [phone]

## 2019-06-22 NOTE — ED Notes (Signed)
Pt given meal tray, two cranberry juices and repositioned in bed.

## 2019-06-22 NOTE — ED Notes (Signed)
Report to [place], rn.

## 2019-06-22 NOTE — ED Notes (Signed)
Bed adjusted for comfort. Toilet cleansed of stool. Lights dimmed for comfort.

## 2019-06-23 ENCOUNTER — Encounter: Payer: Self-pay | Admitting: Emergency Medicine

## 2019-06-23 DIAGNOSIS — R531 Weakness: Secondary | ICD-10-CM | POA: Diagnosis not present

## 2019-06-23 NOTE — ED Notes (Signed)
Pt provided dinner tray; able to feed self. 

## 2019-06-23 NOTE — ED Provider Notes (Signed)
-----------------------------------------   6:18 AM on 06/23/2019 -----------------------------------------   Blood pressure (!) 109/56, pulse (!) 59, temperature 97.6 F (36.4 C), temperature source Oral, resp. rate 17, height 6' (1.829 m), weight 121.7 kg, SpO2 99 %.  The patient is sleeping at this time.  There have been no acute events since the last update.  Awaiting disposition plan from Social Work team(s).   Paulette Blanch, MD 06/23/19 [phone]

## 2019-06-23 NOTE — ED Notes (Signed)
Pt ambulatory to toilet w/ standby assist, no difficulty noted at this time.

## 2019-06-23 NOTE — ED Notes (Signed)
Pt provided lunch tray; able to feed self. 

## 2019-06-23 NOTE — ED Notes (Signed)
Pt given diet sprite and cranberry juice.  Call bell in place, patient denies any other needs at this time.

## 2019-06-23 NOTE — ED Notes (Signed)
Pt provided breakfast tray; able to feed self. 

## 2019-06-23 NOTE — Care Management (Addendum)
TOC RN CM: call to  Bridgette Habermann [phone]) APS social worker requesting income related to placement. Confirmed patient has no income and APS is expecting Medicaid to cover the bill of the placement.   TOC RN CM: Call to Alta @ [facility] to update patient has no income and Medicaid would be his payer source. Roderic Palau is planning to outreach to [facility] APS for clarification on payer plan.

## 2019-06-24 DIAGNOSIS — R531 Weakness: Secondary | ICD-10-CM | POA: Diagnosis not present

## 2019-06-24 NOTE — ED Notes (Signed)
Pt provided with dinner and diet lemon drink per pt;s request. Pt does not voice any other needs at this time.

## 2019-06-24 NOTE — Care Management (Signed)
TOC RN CM: Call to [employer], APS SW to follow up on placement. Sanjuana Kava that patient had been denied [facility] due to no income and no payer source other than Medicaid. Jerene Pitch states patient has been declared competent and has no family for assistance. Patient has bed bugs in his apartment and APS declared it not safe to return. Patient has no income and no heat or electricity. Brooke requested copy of patients ID be sent to her to check on possible assistance for heating/electric bill. RN CM discussed possibility of patient discharging home once electricity and heat returned andAPS can work with patient for [facility] placement from home once patient has income. Brooke requested patient be sent out for possible short term SNF placement. TOC will send out request to other counties.

## 2019-06-24 NOTE — Progress Notes (Signed)
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 PMH includes skin cancer, bipolar disorder, DM, depresison.    PT Comments    Pt pleasant and motivated to participate throughout the session.  Pt made good progress towards goals and reported no adverse symptoms.  Pt presented with mild deficits in functional strength but ultimately did not require physical assistance with any task.  Pt drifted mildly left/right during amb but was able to ambulate 31' without an AD.  Pt will benefit from HHPT services upon discharge to safely address deficits listed in patient problem list for decreased caregiver assistance and eventual return to PLOF.    Follow Up Recommendations  Home health PT;Supervision - Intermittent     Equipment Recommendations  3in1 (PT)    Recommendations for Other Services       Precautions / Restrictions Precautions Precautions: Fall Restrictions Weight Bearing Restrictions: No    Mobility  Bed Mobility Overal bed mobility: Modified Independent             General bed mobility comments: Extra time and effort required with all bed mobility tasks but no physical assistance needed  Transfers Overall transfer level: Needs assistance Equipment used: None Transfers: Sit to/from Stand Sit to Stand: Supervision         General transfer comment: Fair eccentric and concentric control and good stability upon initial stand  Ambulation/Gait Ambulation/Gait assistance: Supervision Gait Distance (Feet): 80 Feet Assistive device:  None Gait Pattern/deviations: Step-through pattern;Decreased step length - right;Decreased step length - left;Drifts right/left Gait velocity: decreased   General Gait Details: Slow cadence with short B step length with minor drifting left/right but no physical assist required to prevent LOB   Stairs             Wheelchair Mobility    Modified Rankin (Stroke Patients Only)       Balance Overall balance assessment: Mild deficits observed, not formally tested Sitting-balance support: Feet supported;Bilateral upper extremity supported Sitting balance-Leahy Scale: Good     Standing balance support: No upper extremity supported;During functional activity Standing balance-Leahy Scale: Good                              Cognition Arousal/Alertness: Awake/alert Behavior During Therapy: WFL for tasks assessed/performed Overall Cognitive Status: Within Functional Limits for tasks assessed                                        Exercises Total Joint Exercises Ankle Circles/Pumps: AROM;Strengthening;Both;10 reps Quad Sets: Strengthening;Both;10 reps Gluteal Sets: Strengthening;Both;10 reps Heel Slides: AROM;Both;5 reps Hip ABduction/ADduction: Strengthening;Both;10 reps Straight Leg Raises: Strengthening;Both;10 reps Long Arc Quad: AROM;Strengthening;Both;10 reps;15 reps Knee Flexion: AROM;Strengthening;Both;10 reps;15 reps Other Exercises Other Exercises: Dynamic standing balance training with reaching outside BOS with various foot positions Other Exercises: Static standing balance training with various foot positions and combinations of eyes open/closed and head still/head turns    General Comments  Pertinent Vitals/Pain Pain Assessment: No/denies pain    Home Living                      Prior Function            PT Goals (current goals can now be found in the care plan section) Progress towards PT goals: Progressing  toward goals    Frequency    Min 2X/week      PT Plan Discharge plan needs to be updated    Co-evaluation              AM-PAC PT "6 Clicks" Mobility   Outcome Measure  Help needed turning from your back to your side while in a flat bed without using bedrails?: None Help needed moving from lying on your back to sitting on the side of a flat bed without using bedrails?: None Help needed moving to and from a bed to a chair (including a wheelchair)?: A Little Help needed standing up from a chair using your arms (e.g., wheelchair or bedside chair)?: A Little Help needed to walk in hospital room?: A Little Help needed climbing 3-5 steps with a railing? : A Little 6 Click Score: 20    End of Session Equipment Utilized During Treatment: Gait belt Activity Tolerance: Patient tolerated treatment well Patient left: with call bell/phone within reach;in bed Nurse Communication: Mobility status PT Visit Diagnosis: Muscle weakness (generalized) (M62.81);Difficulty in walking, not elsewhere classified (R26.2);Unsteadiness on feet (R26.81)     Time: [id number] PT Time Calculation (min) (ACUTE ONLY): 24 min  Charges:  $Therapeutic Exercise: 23-37 mins                     D. Scott Rosslyn Pasion PT, DPT 06/24/19, 12:20 PM

## 2019-06-24 NOTE — Care Management (Signed)
TOC RN CM: Call to Bridgette Habermann, APS SW advising that PT had changed patient to discharge recommendation of Home instead of SNF. Jerene Pitch states that patient will need to go home and handle his power bills without assistance from APS. APS will monitor and if patient unable to handle living home independently APS will seek guardianship and assist with placement. RN CM expressed concerns of patient discharging home with no power or heat and inability to manage at home. APS SW states she will fax over form for authorization rep in order for APS SW to speak on patients behalf to [place].

## 2019-06-24 NOTE — ED Notes (Signed)
Pt currently on phone with social worker to discuss care.

## 2019-06-25 DIAGNOSIS — R531 Weakness: Secondary | ICD-10-CM | POA: Diagnosis not present

## 2019-06-25 NOTE — ED Notes (Signed)
Pt is alert and oriented X 4. NAD. VSS.  Waiting on disposition.  Given new ice water.  Pt declined toothbrush at this time.  reports had bowel movement earlier this morning.  No needs at this time.

## 2019-06-25 NOTE — Social Work (Addendum)
TOC SW: Received email from [place] at [facility], they do not have bed availability.  Will contact Brooke at [facility], to discuss next steps.  Spoke with Jerene Pitch (APS) on status for patient.  Informed Brooke about placement denial and inquired on next steps which APS will be taking to assist patient.  Jerene Pitch stated she will come by today to have patient sign authorization for create online profile for SS/Disability application status.  Brooke informed the Education officer, museum patient has applied for Medicaid in November and is waiting approval.  Patient currently does not have electricity in the home and the home is infested with bed bugs. This SW inquired about assistance APS will be able to give patient with electric bill or bed bugs.  Brooke stated APS in unable to assist patient with electricity before he goes back home and that patient will need to speak with leasing office directly about bed bug infestation.  The SW requested for Brooke to let her know when she visits with patient.

## 2019-06-25 NOTE — Social Work (Addendum)
TOC SW:  Spoke to patient and updated him on current status of SNF placement, housing situation and possible discharge.  I informed patient about housing situation and although he has rent covered until May 15th, he will need find a way to pay for electricity.  The SW informed patient on SNF placement status, and that there were difficulties in placement due to his Medicare. Informed patient that APS social worker Jerene Pitch would be coming by to see patient to get authorization for SS/Disability online profile.  Informed patient about possible discharge home with home health in place, and patient stated that he is unable to do for himself, stated he is unable to cook because he cannot stand for long periods of time, he is unable to clean because he cannot bend over to pick up items, and he is unable to live on his own because he has no way of getting income. Patient is willing to return home, but main concern is getting assistance for ADLs, and feels that it would be better if he went to a group home.   Informed patient about concerns over cleanliness of home and how that would affect treatment for bed bugs.  Informed patient the leasing office can come in and treat bed bugs but the home must be cleaned before that, patient expressed concerned over his physical ability to clean the house.   During conversation patient forgot the thread of the conversation a few times.  Spoke with Jerene Pitch APS, she stated she unable to find utilities assistance because the light has been cut off.  She will come by today and meet with patient, but is aware that SNF placement has been difficult and patient will probably need to return home.

## 2019-06-25 NOTE — ED Notes (Addendum)
Pt given apple sauce, graham crackers and cranberry juice at this time, pt denies any other needs. Call bell in reach

## 2019-06-25 NOTE — ED Notes (Signed)
Pt given dinner. Eating independently in room. Call bell in reach.

## 2019-06-26 NOTE — ED Provider Notes (Signed)
-----------------------------------------   2:50 PM on 06/26/2019 -----------------------------------------  Social work and case management have been working with this patient throughout his 500+ hour hospital stay. They've gotten to the point where they have no other assistance to offer the patient as he refuses to give up guardianship and refuses to ask his family for assistance. We've been unable to place the patient in a group home or facility. Reassuringly however patient's legs have improved dramatically since I initially saw him 500 hours ago, he has been ambulating with physical therapy. I had a discussion with the patient regarding guardianship and he still wishes to maintain his own medical and financial decisions, he states if no other options are available then he would accept discharge home. From a medical standpoint patient has improved dramatically as far as his lower extremities skin condition and ambulation. We will help arrange for assistance to get the patient home, patient agreeable to plan of care.   Bradley Dark, MD 06/26/19 1452

## 2019-06-26 NOTE — Care Management (Signed)
TOC RN CM: RN CM met with patient in the room to discuss discharge plan for today to return to his apartment. Discussed the requirement for patient to clean apartment and the checklist received by exterminator to prepare home for treatment. Patient became very agitated and loud stating he could not work to clean his house and that he would need someone to do it for him. CM explained APS Worker would be in contact with patient and if patient was unable to make his own decisions SW could help with guardianship.Patient states he has no ride home-CM offered taxi voucher and patient unsure if he could get in and out of car. CM explained patient had been navigating well with physical therapy however EMS transport was possibility if patient unable to utilize taxi. Patient very frustrated about discharging home and not willing to discuss possible need for guardianship. TOC signing off as APS SW and Nurse aware of discharge needs. Unable to utilize home health due to bed bugs.

## 2019-06-26 NOTE — ED Notes (Signed)
Pt able to walk in room without assistance, pt able to feed self. Pt Aand O x 4, Pt to be discharged to home as there are no placements available at this time. Pt given discharge papers, community social worker informed of pt's discharge per hospital Social Worker. Pt  States he is unhappy about discharge. Pt given taxi voucher and wheeled to lobby to wait for taxi. Pt given paper scrubs to wear as pt's clothes were very dirty. Pt refused discharge vital signs and lunch before discharge.

## 2019-06-26 NOTE — Social Work (Addendum)
TOC SW: This SW was present when APS case worker Bridgette Habermann came to to see patient 06/25/2019. Brooke spoke with patient about housing situation and explained to patient that she was unable to get assistance to pay electric bill and the patient, when discharged, if he was returning home, he would need to find a way to pay for his electricity.  Brooke asked patient if he had a plan to find income, and patient stated "no I don't have a plan."  Brooke informed patient if he was unable to make decisions for himself she could apply for state guardianship and the state would make decisions for patient.  Patient became mildly agitated and stated "I don't need a guardian."   Patient stated he thought he was going to a group home and Brooke and this SW, informed him that because of his Medicaid status there was difficulty in placing him.  Patient inquired about his social security status and Jerene Pitch informed him that she was checking up on it, but has no received a confirmation about the approval.  Brooke again asked patient if he had a plan in place if he was not able to stay in his apartment, and patient again stated that he did not have a plan.  Brooke then informed patient that he may have limited options and asked patient if he knew of somewhere he could go if he could not stay in his apartment.  Patient stated that he did not.    Brooke asked patient if he was willing to ask his brother for assistance and patient stated 'he doesn't care about what happenes to me." and refused to contact brother.  Brooke went over the different options for patient after discharge and patient did not make a decision or tell Jerene Pitch of his plan after discharge.  This SW informed patient that we were still trying to place him, but it seemed unlikely that we would be able to find placement and once he was discharged he would probably need to go home or go to a shelter.  Patient stated "I thought I was going to be able to stay here until  the 14th of February."  This SW informed patient that date was not a definite date and patient could be discharged before that date. Brooke and this SW asked patient if he understood the information he had been given and he verbally acknowledge he understood.  This SW spoke with Jerene Pitch and let her know that this SW would be inquiring about patient readiness for discharge and that he maybe discharge soon. Brooke requested this SW let her know when patient was ready for discharge.

## 2019-06-26 NOTE — ED Provider Notes (Signed)
Vital signs: Temperature 97.5 heart rate 50 blood pressure 132/67 respiratory rate 16 satting 99% on room air.  Patient sleeping at this time no acute events over the course of my shift.  Awaiting disposition from social work team.   Gregor Hams, MD 06/26/19 [phone]

## 2019-08-19 ENCOUNTER — Emergency Department: Payer: Medicare Other

## 2019-08-19 ENCOUNTER — Emergency Department
Admission: EM | Admit: 2019-08-19 | Discharge: 2019-08-22 | Disposition: A | Discharge status: Skilled Nursing Facility | Payer: Medicare Other | Attending: Emergency Medicine | Admitting: Emergency Medicine

## 2019-08-19 ENCOUNTER — Encounter: Payer: Self-pay | Admitting: Emergency Medicine

## 2019-08-19 ENCOUNTER — Other Ambulatory Visit: Payer: Self-pay

## 2019-08-19 DIAGNOSIS — Z9049 Acquired absence of other specified parts of digestive tract: Secondary | ICD-10-CM | POA: Diagnosis not present

## 2019-08-19 DIAGNOSIS — I872 Venous insufficiency (chronic) (peripheral): Secondary | ICD-10-CM | POA: Insufficient documentation

## 2019-08-19 DIAGNOSIS — E119 Type 2 diabetes mellitus without complications: Secondary | ICD-10-CM | POA: Insufficient documentation

## 2019-08-19 DIAGNOSIS — I890 Lymphedema, not elsewhere classified: Secondary | ICD-10-CM | POA: Insufficient documentation

## 2019-08-19 DIAGNOSIS — R531 Weakness: Secondary | ICD-10-CM | POA: Diagnosis present

## 2019-08-19 DIAGNOSIS — R55 Syncope and collapse: Secondary | ICD-10-CM | POA: Diagnosis not present

## 2019-08-19 DIAGNOSIS — Z85828 Personal history of other malignant neoplasm of skin: Secondary | ICD-10-CM | POA: Insufficient documentation

## 2019-08-19 DIAGNOSIS — Z20822 Contact with and (suspected) exposure to covid-19: Secondary | ICD-10-CM | POA: Diagnosis not present

## 2019-08-19 LAB — COMPREHENSIVE METABOLIC PANEL
ALT: 22 U/L (ref 0–44)
AST: 33 U/L (ref 15–41)
Albumin: 3.6 g/dL (ref 3.5–5.0)
Alkaline Phosphatase: 75 U/L (ref 38–126)
Anion gap: 13 (ref 5–15)
BUN: 14 mg/dL (ref 8–23)
CO2: 19 mmol/L — ABNORMAL LOW (ref 22–32)
Calcium: 8.7 mg/dL — ABNORMAL LOW (ref 8.9–10.3)
Chloride: 105 mmol/L (ref 98–111)
Creatinine, Ser: 0.9 mg/dL (ref 0.61–1.24)
GFR calc Af Amer: >60 mL/min (ref >60)
GFR calc non Af Amer: >60 mL/min (ref >60)
Glucose, Bld: 167 mg/dL — ABNORMAL HIGH (ref 70–99)
Potassium: 3 mmol/L — ABNORMAL LOW (ref 3.5–5.1)
Sodium: 137 mmol/L (ref 135–145)
Total Bilirubin: 1.4 mg/dL — ABNORMAL HIGH (ref 0.3–1.2)
Total Protein: 8.4 g/dL — ABNORMAL HIGH (ref 6.5–8.1)

## 2019-08-19 LAB — CBC WITH DIFFERENTIAL/PLATELET
Abs Immature Granulocytes: 0.03 10*3/uL (ref 0.00–0.07)
Basophils Absolute: 0.1 10*3/uL (ref 0.0–0.1)
Basophils Relative: 1 %
Eosinophils Absolute: 1.2 10*3/uL — ABNORMAL HIGH (ref 0.0–0.5)
Eosinophils Relative: 13 %
HCT: 44.4 % (ref 39.0–52.0)
Hemoglobin: 15.1 g/dL (ref 13.0–17.0)
Immature Granulocytes: 0 %
Lymphocytes Relative: 13 %
Lymphs Abs: 1.2 10*3/uL (ref 0.7–4.0)
MCH: 32.2 pg (ref 26.0–34.0)
MCHC: 34 g/dL (ref 30.0–36.0)
MCV: 94.7 fL (ref 80.0–100.0)
Monocytes Absolute: 0.5 10*3/uL (ref 0.1–1.0)
Monocytes Relative: 6 %
Neutro Abs: 6.3 10*3/uL (ref 1.7–7.7)
Neutrophils Relative %: 67 %
Platelets: 275 10*3/uL (ref 150–400)
RBC: 4.69 MIL/uL (ref 4.22–5.81)
RDW: 14 % (ref 11.5–15.5)
WBC: 9.3 10*3/uL (ref 4.0–10.5)
nRBC: 0 % (ref 0.0–0.2)

## 2019-08-19 LAB — TROPONIN I (HIGH SENSITIVITY): Troponin I (High Sensitivity): 6 ng/L (ref <18)

## 2019-08-19 LAB — PROTIME-INR
INR: 1.1 (ref 0.8–1.2)
Prothrombin Time: 14.2 seconds (ref 11.4–15.2)

## 2019-08-19 LAB — LACTIC ACID, PLASMA
Lactic Acid, Venous: 3.7 mmol/L (ref 0.5–1.9)
Lactic Acid, Venous: 2 mmol/L (ref 0.5–1.9)

## 2019-08-19 LAB — RESPIRATORY PANEL BY RT PCR (FLU A&B, COVID)
Influenza A by PCR: NEGATIVE
Influenza B by PCR: NEGATIVE
SARS Coronavirus 2 by RT PCR: NEGATIVE

## 2019-08-19 LAB — PROCALCITONIN: Procalcitonin: <0.1 ng/mL

## 2019-08-19 LAB — LIPASE, BLOOD: Lipase: 14 U/L (ref 11–51)

## 2019-08-19 LAB — CK: Total CK: 96 U/L (ref 49–397)

## 2019-08-19 LAB — APTT: aPTT: 30 seconds (ref 24–36)

## 2019-08-19 MED ORDER — LACTATED RINGERS IV BOLUS (SEPSIS)
1000.0000 mL | Freq: Once | INTRAVENOUS | Status: AC
  Administered 2019-08-19: 1000 mL via INTRAVENOUS

## 2019-08-19 MED ORDER — CITALOPRAM HYDROBROMIDE 20 MG PO TABS
20.0000 mg | ORAL_TABLET | Freq: Every day | ORAL | Status: DC
  Administered 2019-08-19 – 2019-08-22 (×4): 20 mg via ORAL
  Filled 2019-08-19 (×4): qty 1

## 2019-08-19 MED ORDER — METFORMIN HCL 500 MG PO TABS
500.0000 mg | ORAL_TABLET | Freq: Two times a day (BID) | ORAL | Status: DC
  Administered 2019-08-19 – 2019-08-22 (×7): 500 mg via ORAL
  Filled 2019-08-19 (×7): qty 1

## 2019-08-19 MED ORDER — HYDROCORTISONE 1 % EX CREA
TOPICAL_CREAM | Freq: Two times a day (BID) | CUTANEOUS | Status: DC
  Administered 2019-08-20 (×2): 1 via TOPICAL
  Filled 2019-08-19: qty 28
  Filled 2019-08-19: qty 84
  Filled 2019-08-19 (×7): qty 28

## 2019-08-19 MED ORDER — AMMONIUM LACTATE 12 % EX LOTN
TOPICAL_LOTION | CUTANEOUS | Status: DC | PRN
  Filled 2019-08-19 (×5): qty 400

## 2019-08-19 MED ORDER — HYDROCERIN EX CREA
TOPICAL_CREAM | Freq: Two times a day (BID) | CUTANEOUS | Status: DC
  Administered 2019-08-19 – 2019-08-20 (×2): 1 via TOPICAL
  Filled 2019-08-19 (×6): qty 113

## 2019-08-19 NOTE — ED Provider Notes (Deleted)
Procedures     ----------------------------------------- 8:37 AM on 08/19/2019 ----------------------------------------- Pt p/w weakness, multiple falls, and head injury at home.  Initial vital signs are normal. BP 94/54, MAP = 67. Doubt sepsis based on initial presentation. Will pursue broad lab workup, CT head/neck for trauma eval.    Pt is morbidly obese with BMI 38. If workup reveals evidence of sepsis and shock, we will use IBW of 78kg for fluid calculation, which would indicate total IVF volume of 2500 mL.     Carrie Mew, MD 08/19/19 [phone]

## 2019-08-19 NOTE — ED Notes (Addendum)
PT at bedside. Will swab for COVID when finished.

## 2019-08-19 NOTE — Evaluation (Signed)
Occupational Therapy Evaluation Patient Details Name: Bradley Griffin MRN: [id number] DOB: 1950/08/05 Today's Date: 08/19/2019    History of Present Illness Pt is 69 yo male that presented to ED after a fall at home where pt reported he did hit his head. PMH of basal cell carcinoma of face lateral to R eye, bipolar disorder, DM, depression.   Clinical Impression   Bradley Griffin was seen for OT evaluation this date. Prior to hospital admission, pt was MOD I for mobility and has had increasing difficulty with ADLs. Pt lives alone in an apartment with no available help at discharge. Pt presents to acute OT demonstrating impaired ADL performance and functional mobility 2/2 decreased LB access, functional strength/ROM/balance deficits, and pain. Pt currently requires SETUP for grooming ADL tasks reclined on gurney and declined mobility assessment after becoming emotional about anticipated BLE movement.  Pt would benefit from skilled OT to address noted impairments and functional limitations (see below for any additional details) in order to maximize safety and independence while minimizing falls risk and caregiver burden. Upon hospital discharge, recommend STR to maximize pt safety and return to PLOF.     Follow Up Recommendations  SNF    Equipment Recommendations  3 in 1 bedside commode    Recommendations for Other Services       Precautions / Restrictions Precautions Precautions: None Precaution Comments: pt with significant skin concerns of BLE Restrictions Weight Bearing Restrictions: No      Mobility Bed Mobility Overal bed mobility: Needs Assistance Bed Mobility: Supine to Sit;Sit to Supine     Supine to sit: Mod assist;HOB elevated Sit to supine: HOB elevated;Min assist   General bed mobility comments: Pt became emotional at thought of getting out of bed / donning socks. Declined mobility assessment this date  Transfers Overall transfer level: Needs assistance Equipment used:  Rolling walker (2 wheeled) Transfers: Sit to/from Stand Sit to Stand: Min assist              Balance Overall balance assessment: Needs assistance Sitting-balance support: Feet supported Sitting balance-Leahy Scale: Fair       Standing balance-Leahy Scale: Poor                             ADL either performed or assessed with clinical judgement   ADL Overall ADL's : Needs assistance/impaired                                       General ADL Comments: SETUP self-drinking, face washing, and don/doff glasses reclined on gurney.      Vision Baseline Vision/History: Wears glasses Wears Glasses: At all times Patient Visual Report: No change from baseline       Perception     Praxis      Pertinent Vitals/Pain Pain Assessment: Faces Pain Score: 3  Faces Pain Scale: Hurts a little bit Pain Location: headache Pain Descriptors / Indicators: Aching Pain Intervention(s): Limited activity within patient's tolerance;Monitored during session     Hand Dominance Left   Extremity/Trunk Assessment Upper Extremity Assessment Upper Extremity Assessment: Overall WFL for tasks assessed   Lower Extremity Assessment Lower Extremity Assessment: Generalized weakness   Cervical / Trunk Assessment Cervical / Trunk Assessment: Kyphotic   Communication Communication Communication: No difficulties   Cognition Arousal/Alertness: Awake/alert Behavior During Therapy: WFL for tasks assessed/performed Overall Cognitive Status: Within  Functional Limits for tasks assessed                                 General Comments: Pt became emotional at thought of moving   General Comments  BLE wounds/skin condition visualized    Exercises Exercises: Other exercises Other Exercises Other Exercises: Pt educated re: falls prevention, home/routines modifications, DME recommendations, OT role, discharge recommendations, adapted dressing techniques, AE  recommendations, and PLB Other Exercises: Self-drinking, face washing, don/doff glasses   Shoulder Instructions      Home Living Family/patient expects to be discharged to:: Private residence Living Arrangements: Alone Available Help at Discharge: Other (Comment)(none) Type of Home: Apartment Home Access: Level entry     Home Layout: One level     Bathroom Shower/Tub: Teacher, early years/pre: Standard Bathroom Accessibility: No   Home Equipment: Cane - single point;Walker - 2 wheels   Additional Comments: does not use RW, occasionally uses SPC      Prior Functioning/Environment Level of Independence: Needs assistance  Gait / Transfers Assistance Needed: Pt reported that he uses his cane "some" ADL's / Homemaking Assistance Needed: Pt reports limited ability to bathe 2/2 safety concerns even sponge baths. Difficulty c LB dressing 2/2 significant skin concerns of BLE. Pt unable to wear socks or regular shoes given condition of feet, states he has a pair of sandals that he can slide on.   Comments: pt reports 5 falls in the last 6 months        OT Problem List: Decreased strength;Decreased range of motion;Decreased activity tolerance;Impaired balance (sitting and/or standing);Decreased safety awareness;Decreased knowledge of use of DME or AE;Decreased knowledge of precautions;Pain      OT Treatment/Interventions: Self-care/ADL training;Therapeutic exercise;Neuromuscular education;Energy conservation;DME and/or AE instruction;Therapeutic activities;Patient/family education;Balance training    OT Goals(Current goals can be found in the care plan section) Acute Rehab OT Goals Patient Stated Goal: to go home OT Goal Formulation: With patient Time For Goal Achievement: 09/02/19 Potential to Achieve Goals: Fair ADL Goals Pt Will Perform Upper Body Dressing: with min assist;sitting Pt Will Perform Lower Body Dressing: with mod assist;with adaptive equipment;sit to/from  stand(c LRAD & AE PRN) Pt Will Transfer to Toilet: with max assist;bedside commode;stand pivot transfer  OT Frequency: Min 2X/week   Barriers to D/C: Inaccessible home environment;Decreased caregiver support          Co-evaluation              AM-PAC OT "6 Clicks" Daily Activity     Outcome Measure Help from another person eating meals?: None Help from another person taking care of personal grooming?: A Little Help from another person toileting, which includes using toliet, bedpan, or urinal?: A Lot Help from another person bathing (including washing, rinsing, drying)?: A Lot Help from another person to put on and taking off regular upper body clothing?: A Little Help from another person to put on and taking off regular lower body clothing?: A Lot 6 Click Score: 16   End of Session    Activity Tolerance: Patient tolerated treatment well;Treatment limited secondary to agitation Patient left: with call bell/phone within reach;Other (comment)(On gurney c both rails up)  OT Visit Diagnosis: Repeated falls (R29.6);Other abnormalities of gait and mobility (R26.89)                Time: [id number] OT Time Calculation (min): 18 min Charges:  OT General Charges $OT Visit: 1 Visit  OT Evaluation $OT Eval Low Complexity: 1 Low OT Treatments $Self Care/Home Management : 8-22 mins  Dessie Coma, M.S. OTR/L  08/19/19, 2:23 PM

## 2019-08-19 NOTE — NC FL2 (Deleted)
  [place] LEVEL OF CARE SCREENING TOOL     IDENTIFICATION  Patient Name: Bradley Griffin Birthdate: [birth year] Sex: male Admission Date (Current Location): 08/19/2019  [place] and [place] Number:  Engineering geologist and Address:  [facility], [address] [place], [place] [address]      Provider Number: [id number]  Attending Physician Name and Address:  Carrie Mew, MD  Relative Name and Phone Number:       Current Level of Care: Hospital Recommended Level of Care: [place] Prior Approval Number:    Date Approved/Denied:   PASRR Number:    Discharge Plan: Other (Comment)(Long Term Care)    Current Diagnoses: Patient Active Problem List   Diagnosis Date Noted  . Bipolar 2 disorder ([place]) 02/10/2019  . Borderline personality disorder ([place]) 02/10/2019  . Basal cell carcinoma 02/10/2019  . Diabetes ([place]) 02/10/2019  . 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 Generalized weakness 09/19/2018    Orientation RESPIRATION BLADDER Height & Weight     Self, Time, Situation, Place  Normal Continent Weight: 127 kg Height:     BEHAVIORAL SYMPTOMS/MOOD NEUROLOGICAL BOWEL NUTRITION STATUS      Continent Diet(Regular)  AMBULATORY STATUS COMMUNICATION OF NEEDS Skin   Extensive Assist Verbally Normal                       Personal Care Assistance Level of Assistance  Bathing, Feeding, Dressing Bathing Assistance: Limited assistance Feeding assistance: Limited assistance Dressing Assistance: Limited assistance     Functional Limitations Info  Sight, Hearing Sight Info: Impaired(Glasses) Hearing Info: Adequate      SPECIAL CARE FACTORS FREQUENCY                       Contractures      Additional Factors Info                  Current Medications (08/19/2019):  This is the current hospital active medication list Current  Facility-Administered Medications  Medication Dose Route Frequency Provider Last Rate Last Admin  . ammonium lactate (LAC-HYDRIN) 12 % lotion   Topical PRN Carrie Mew, MD      . citalopram (CELEXA) tablet 20 mg  20 mg Oral Daily Carrie Mew, MD   20 mg at 08/19/19 1042  . hydrocerin (EUCERIN) cream   Topical BID Carrie Mew, MD      . hydrocortisone cream 1 %   Topical BID Carrie Mew, MD      . metFORMIN (GLUCOPHAGE) tablet 500 mg  500 mg Oral BID WC Carrie Mew, MD   500 mg at 08/19/19 1042   No current outpatient medications on file.     Discharge Medications: Please see discharge summary for a list of discharge medications.  Relevant Imaging Results:  Relevant Lab Results:   Additional Information SS# [id number]  [name], RN

## 2019-08-19 NOTE — ED Notes (Signed)
Date and time results received: 08/19/19 0925   Test: lactic acid Critical Value: 3.7  Name of Provider Notified: Dr. Joni Fears

## 2019-08-19 NOTE — TOC Initial Note (Signed)
Transition of Care [facility]) - Initial/Assessment Note    Patient Details  Name: Bradley Griffin MRN: [id number] Date of Birth: 04/21/1951  Transition [facility]) CM/SW Contact:    [name], RN Phone Number: 08/19/2019, 4:11 PM  Clinical Narrative:                 Spoke with patient with PT at bedside. Will attempt to place patient in facility for LTC under Medicaid. If unable to place will plan discharge home with home health. Patient is well known to APS and they are aware of the unsanitary living arrangements and financial constraints however patient is not active in his own care and makes no attempt to improve when offered assistance.   Expected Discharge Plan: [place]     Patient Goals and CMS Choice Patient states their goals for this hospitalization and ongoing recovery are:: Get placed into long term care facility      Expected Discharge Plan and Services Expected Discharge Plan: [place] In-house Referral: Clinical Social Work     Living arrangements for the past 2 months: Apartment                                      Prior Living Arrangements/Services Living arrangements for the past 2 months: Apartment Lives with:: Self Patient language and need for interpreter reviewed:: Yes Do you feel safe going back to the place where you live?: No   Patient requesting placement in LTC facility but understands it takes time  Need for Family Participation in Patient Care: No (Comment) Care giver support system in place?: No (comment) Current home services: DME(walker) Criminal Activity/Legal Involvement Pertinent to Current Situation/Hospitalization: No - Comment as needed  Activities of Daily Living      Permission Sought/Granted Permission sought to share information with : Case Manager Permission granted to share information with : Yes, Verbal Permission Granted              Emotional Assessment Appearance::  Appears stated age Attitude/Demeanor/Rapport: Complaining, Engaged Affect (typically observed): Depressed, Flat, Frustrated Orientation: : Oriented to Self, Oriented to Place, Oriented to  Time, Oriented to Situation Alcohol / Substance Use: Never Used Psych Involvement: No (comment)  Admission diagnosis:  syncope ems Patient Active Problem List   Diagnosis Date Noted  . Bipolar 2 disorder ([place]) 02/10/2019  . Borderline personality disorder ([place]) 02/10/2019  . Basal cell carcinoma 02/10/2019  . Diabetes ([place]) 02/10/2019  . 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 Generalized weakness 09/19/2018   PCP:  Patient, No Pcp Per Pharmacy:   [facility] [place], [place] [address] [facility] [address] [place] [address] Phone: [phone] Fax: [phone]  [id number] - [place], [place] - [address] [place] [address] Phone: [phone] Fax: [phone]  [place], [place] [address] Phone: [phone] Fax: [place] [id number] [place], [place] - [place] [address] [place] [address] Phone: [phone] Fax: [phone]     Social Determinants of Health (SDOH) Interventions    Readmission Risk  Interventions No flowsheet data found.

## 2019-08-19 NOTE — ED Notes (Signed)
Pt provided with two lemon/limes with ice at this time.

## 2019-08-19 NOTE — NC FL2 (Signed)
  [place] LEVEL OF CARE SCREENING TOOL     IDENTIFICATION  Patient Name: Bradley Griffin Birthdate: [birth year] Sex: male Admission Date (Current Location): 08/19/2019  [place] and [place] Number:  Engineering geologist and Address:  [facility], [address], [place], [place] [address]      Provider Number: [id number]  Attending Physician Name and Address:  Carrie Mew, MD  Relative Name and Phone Number:       Current Level of Care: Hospital Recommended Level of Care: [place] Prior Approval Number:    Date Approved/Denied:   PASRR Number:    Discharge Plan: Other (Comment)(Long Term Care)    Current Diagnoses: Patient Active Problem List   Diagnosis Date Noted  . Bipolar 2 disorder ([place]) 02/10/2019  . Borderline personality disorder ([place]) 02/10/2019  . Basal cell carcinoma 02/10/2019  . Diabetes ([place]) 02/10/2019  . 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 Generalized weakness 09/19/2018    Orientation RESPIRATION BLADDER Height & Weight     Self, Time, Situation, Place  Normal Continent Weight: 127 kg Height:     BEHAVIORAL SYMPTOMS/MOOD NEUROLOGICAL BOWEL NUTRITION STATUS      Continent Diet(Regular)  AMBULATORY STATUS COMMUNICATION OF NEEDS Skin   Extensive Assist Verbally Normal                       Personal Care Assistance Level of Assistance  Bathing, Feeding, Dressing Bathing Assistance: Limited assistance Feeding assistance: Limited assistance Dressing Assistance: Limited assistance     Functional Limitations Info  Sight, Hearing Sight Info: Impaired(Glasses) Hearing Info: Adequate      SPECIAL CARE FACTORS FREQUENCY                       Contractures      Additional Factors Info                  Current Medications (08/19/2019):  This is the current hospital active medication list Current  Facility-Administered Medications  Medication Dose Route Frequency Provider Last Rate Last Admin  . ammonium lactate (LAC-HYDRIN) 12 % lotion   Topical PRN Carrie Mew, MD      . citalopram (CELEXA) tablet 20 mg  20 mg Oral Daily Carrie Mew, MD   20 mg at 08/19/19 1042  . hydrocerin (EUCERIN) cream   Topical BID Carrie Mew, MD      . hydrocortisone cream 1 %   Topical BID Carrie Mew, MD      . metFORMIN (GLUCOPHAGE) tablet 500 mg  500 mg Oral BID WC Carrie Mew, MD   500 mg at 08/19/19 1042   No current outpatient medications on file.     Discharge Medications: Please see discharge summary for a list of discharge medications.  Relevant Imaging Results:  Relevant Lab Results:   Additional Information SS# [id number]  [name], RN

## 2019-08-19 NOTE — TOC Progression Note (Signed)
Transition of Care [facility]) - Progression Note    Patient Details  Name: Bradley Griffin MRN: [id number] Date of Birth: 01/19/1951  Transition of Care [facility]) CM/SW Contact  [name], RN Phone Number: 08/19/2019, 4:58 PM  Clinical Narrative:    Tawni Carnes and therapy notes to facilities for potential LTC placement under Medicaid services.    Expected Discharge Plan: [place]    Expected Discharge Plan and Services Expected Discharge Plan: [place] In-house Referral: Clinical Social Work     Living arrangements for the past 2 months: Apartment                                       Social Determinants of Health (SDOH) Interventions    Readmission Risk Interventions No flowsheet data found.

## 2019-08-19 NOTE — Evaluation (Signed)
Physical Therapy Evaluation Patient Details Name: Bradley Griffin MRN: [id number] DOB: 1950/08/17 Today's Date: 08/19/2019   History of Present Illness  Pt is 69 yo male that presented to ED after a fall at home where pt reported he did hit his head. PMH of basal cell carcinoma of face lateral to R eye, bipolar disorder, DM, depression.    Clinical Impression  Pt alert, emotional during session, oriented. Pt reported at home he uses his cane "some", does not bathe due to increased weakness/difficulty, and dresses modI. Pt reported several falls, one that precipitated this ED visit. Pt lives alone and does not have anyone to assist.  The patient demonstrated supine to sit with modA for trunk elevation, poor sitting balance exhibited by propping of BUE. The patient was able to sit for several minutes, did endorse dizziness, vitals WFLs. Pt stated the dizziness decreased with some time. Sit <> stand with RW and CGA from elevated gurney. Pants doffed per ultrasound tech request for testing, modA from PT in standing. The patient took several lateral steps towards HOB, and returned to supine with minA.  Overall the patient demonstrated deficits (see "PT Problem List") that impede the patient's functional abilities, safety, and mobility and would benefit from skilled PT intervention. Recommendation is SNF due to level of assistance needed and acute decline in functional status.     Follow Up Recommendations SNF    Equipment Recommendations  None recommended by PT    Recommendations for Other Services OT consult     Precautions / Restrictions Precautions Precautions: None Precaution Comments: pt with significant skin concerns of BLE Restrictions Weight Bearing Restrictions: No      Mobility  Bed Mobility Overal bed mobility: Needs Assistance Bed Mobility: Supine to Sit;Sit to Supine     Supine to sit: Mod assist;HOB elevated Sit to supine: HOB elevated;Min assist       Transfers Overall transfer level: Needs assistance Equipment used: Rolling walker (2 wheeled) Transfers: Sit to/from Stand Sit to Stand: Min assist            Ambulation/Gait   Gait Distance (Feet): 2 Feet         General Gait Details: able to laterally step towards HOB with CGA and RW.  Stairs            Wheelchair Mobility    Modified Rankin (Stroke Patients Only)       Balance Overall balance assessment: Needs assistance Sitting-balance support: Feet supported Sitting balance-Leahy Scale: Fair       Standing balance-Leahy Scale: Poor                               Pertinent Vitals/Pain Pain Assessment: 0-10 Pain Score: 3  Pain Location: headache Pain Descriptors / Indicators: Aching Pain Intervention(s): Limited activity within patient's tolerance;Monitored during session;Premedicated before session;Repositioned    Home Living Family/patient expects to be discharged to:: Private residence Living Arrangements: Alone Available Help at Discharge: Other (Comment)(none) Type of Home: Apartment Home Access: Level entry     Home Layout: One level Home Equipment: Cane - single point;Walker - 2 wheels Additional Comments: does not use RW, occasionally uses SPC    Prior Function Level of Independence: Needs assistance   Gait / Transfers Assistance Needed: Pt reported that he uses his cane "some"  ADL's / Homemaking Assistance Needed: pt stated he is unable to bath, including sink baths, difficulty getting dressed  Comments: pt  reports 5 falls in teh last 6 months     Hand Dominance   Dominant Hand: Left    Extremity/Trunk Assessment   Upper Extremity Assessment Upper Extremity Assessment: Overall WFL for tasks assessed    Lower Extremity Assessment Lower Extremity Assessment: Generalized weakness    Cervical / Trunk Assessment Cervical / Trunk Assessment: Kyphotic  Communication   Communication: No difficulties   Cognition Arousal/Alertness: Awake/alert Behavior During Therapy: WFL for tasks assessed/performed Overall Cognitive Status: Within Functional Limits for tasks assessed                                 General Comments: pt emotional during session      General Comments      Exercises     Assessment/Plan    PT Assessment Patient needs continued PT services  PT Problem List Decreased strength;Decreased range of motion;Decreased activity tolerance;Decreased balance;Decreased knowledge of use of DME;Pain;Decreased mobility       PT Treatment Interventions DME instruction;Therapeutic activities;Gait training;Therapeutic exercise;Patient/family education;Stair training;Balance training;Functional mobility training;Neuromuscular re-education    PT Goals (Current goals can be found in the Care Plan section)  Acute Rehab PT Goals Patient Stated Goal: to go home PT Goal Formulation: With patient Time For Goal Achievement: 09/02/19 Potential to Achieve Goals: Good    Frequency Min 2X/week   Barriers to discharge        Co-evaluation               AM-PAC PT "6 Clicks" Mobility  Outcome Measure Help needed turning from your back to your side while in a flat bed without using bedrails?: A Little Help needed moving from lying on your back to sitting on the side of a flat bed without using bedrails?: A Lot Help needed moving to and from a bed to a chair (including a wheelchair)?: A Lot Help needed standing up from a chair using your arms (e.g., wheelchair or bedside chair)?: A Little Help needed to walk in hospital room?: A Lot Help needed climbing 3-5 steps with a railing? : Total 6 Click Score: 13    End of Session Equipment Utilized During Treatment: Gait belt Activity Tolerance: Patient limited by fatigue;Patient limited by pain Patient left: in bed;with bed alarm set Nurse Communication: Mobility status PT Visit Diagnosis: Other abnormalities of gait and  mobility (R26.89);Muscle weakness (generalized) (M62.81);Repeated falls (R29.6)    Time: [id number] PT Time Calculation (min) (ACUTE ONLY): 21 min   Charges:     PT Treatments $Therapeutic Exercise: 8-22 mins       Lieutenant Diego PT, DPT 12:40 PM,08/19/19

## 2019-08-19 NOTE — ED Notes (Signed)
Pt ambulated to toilet with walker. 

## 2019-08-19 NOTE — ED Triage Notes (Signed)
Says he passed out and fell down and hit head.

## 2019-08-19 NOTE — ED Notes (Signed)
Pt states that he fell an hour and a half ago. States he lives by himself and no one comes to help care for him. Feet are crusted over and dried blood noted to them. Pt skin is petechial over much of body. Pt states "I passed out and fell" dried blood noted to R side of face. Pt wearing glasses, glasses in tact. Pt uses a cane.

## 2019-08-19 NOTE — ED Notes (Signed)
Pt provided diet ginger ale and cranberry juice per request.

## 2019-08-19 NOTE — NC FL2 (Signed)
  [place] LEVEL OF CARE SCREENING TOOL     IDENTIFICATION  Patient Name: Bradley Griffin Birthdate: 1950/10/31 Sex: male Admission Date (Current Location): 08/19/2019  [place] and [place] Number:  Selena Lesser  [id number] [place] and Address:  [facility], [address] [place], [place] [address]      Provider Number: [id number]  Attending Physician Name and Address:  Carrie Mew, MD  Relative Name and Phone Number:       Current Level of Care: Hospital Recommended Level of Care: [place] Prior Approval Number:    Date Approved/Denied:   PASRR Number:    Discharge Plan: Other (Comment)(Long Term Care)    Current Diagnoses: Patient Active Problem List   Diagnosis Date Noted  . Bipolar 2 disorder ([place]) 02/10/2019  . Borderline personality disorder ([place]) 02/10/2019  . Basal cell carcinoma 02/10/2019  . Diabetes ([place]) 02/10/2019  . 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 Generalized weakness 09/19/2018    Orientation RESPIRATION BLADDER Height & Weight     Self, Time, Situation, Place  Normal Continent Weight: 127 kg Height:     BEHAVIORAL SYMPTOMS/MOOD NEUROLOGICAL BOWEL NUTRITION STATUS      Continent Diet(Regular)  AMBULATORY STATUS COMMUNICATION OF NEEDS Skin   Extensive Assist Verbally Normal                       Personal Care Assistance Level of Assistance  Bathing, Feeding, Dressing Bathing Assistance: Limited assistance Feeding assistance: Limited assistance Dressing Assistance: Limited assistance     Functional Limitations Info  Sight, Hearing Sight Info: Impaired(Glasses) Hearing Info: Adequate      SPECIAL CARE FACTORS FREQUENCY                       Contractures      Additional Factors Info                  Current Medications (08/19/2019):  This is the current hospital active medication list Current  Facility-Administered Medications  Medication Dose Route Frequency Provider Last Rate Last Admin  . ammonium lactate (LAC-HYDRIN) 12 % lotion   Topical PRN Carrie Mew, MD      . citalopram (CELEXA) tablet 20 mg  20 mg Oral Daily Carrie Mew, MD   20 mg at 08/19/19 1042  . hydrocerin (EUCERIN) cream   Topical BID Carrie Mew, MD      . hydrocortisone cream 1 %   Topical BID Carrie Mew, MD      . metFORMIN (GLUCOPHAGE) tablet 500 mg  500 mg Oral BID WC Carrie Mew, MD   500 mg at 08/19/19 1042   No current outpatient medications on file.     Discharge Medications: Please see discharge summary for a list of discharge medications.  Relevant Imaging Results:  Relevant Lab Results:   Additional Information SS# [id number]  [name], RN

## 2019-08-19 NOTE — ED Triage Notes (Signed)
Pin via EMS from home under sepsis alert. Pt with wounds noted to bilateral lower extremities and to the right side of face.  EMS reports the patient is noncompliant with diabetes medication. EMS reports pt house was unsanitary with trash everywhere. EMS concerned about pt living conditions. ESM reports pt fell this am forward and hit his face on the floor. Pt was sitting on the couch when EMS got there c/o nausea and headache. Stroke screen negative. #20 gauge to right AC. Cap 14, RR 11, CBG 191.

## 2019-08-19 NOTE — ED Provider Notes (Signed)
[facility] Emergency Department Provider Note  ____________________________________________  Time seen: Approximately 8:56 AM  I have reviewed the triage vital signs and the nursing notes.   HISTORY  Chief Complaint Fall, Loss of Consciousness, and Head Injury    Level 5 Caveat: Portions of the History and Physical including HPI and review of systems are unable to be completely obtained due to patient being a poor historian   HPI Bradley Griffin is a 69 y.o. male with a history of diabetes, bipolar disorder, depression who comes the ED due to multiple falls at home.  He reports generalized weakness, lightheadedness with sitting and standing.  He had syncope at home.  Denies chest pain or shortness of breath.  No back pain.  Complains of pain in the lower legs bilaterally which is gradual onset.  No fever or chills.  Electronic medical record reviewed.  He recently came to the hospital under similar circumstances.  Due to patient's unwillingness to accept guardianship or give up independence with his residential arrangement and finances or ask family for assistance, he was unable to be placed in a nursing facility and instead was discharged home after care was taking to resolve bilateral lower extremity dermatitis and to build strength with physical therapy.  Patient reports that since being discharged home he is unable to afford any medications.      Past Medical History:  Diagnosis Date  . Basal cell carcinoma    Skin cancer   . Blindness of left eye   . Diabetes mellitus without complication ([place])   . Squamous cell carcinoma in situ 2011     Patient Active Problem List   Diagnosis Date Noted  . Bipolar 2 disorder ([place]) 02/10/2019  . Borderline personality disorder ([place]) 02/10/2019  . Basal cell carcinoma 02/10/2019  . Diabetes ([place]) 02/10/2019  . Major depressive disorder, recurrent severe without psychotic features ([place]) 02/07/2019  . Generalized  weakness 09/19/2018     Past Surgical History:  Procedure Laterality Date  . CHOLECYSTECTOMY       Prior to Admission medications   Not on File     Allergies Patient has no known allergies.   Family History  Problem Relation Age of Onset  . Leukemia Father     Social History Social History   Tobacco Use  . Smoking status: Never Smoker  . Smokeless tobacco: Never Used  Substance Use Topics  . Alcohol use: Never  . Drug use: Never    Review of Systems Level 5 Caveat: Portions of the History and Physical including HPI and review of systems are unable to be completely obtained due to patient being a poor historian   Constitutional:   No known fever.  ENT:   No rhinorrhea. Cardiovascular:   No chest pain or syncope. Respiratory:   No dyspnea or cough. Gastrointestinal:   Negative for abdominal pain, vomiting and diarrhea.  Musculoskeletal: Bilateral lower leg pain and swelling ____________________________________________   PHYSICAL EXAM:  VITAL SIGNS: ED Triage Vitals  Enc Vitals Group     BP 08/19/19 0757 (!) 94/54     Pulse Rate 08/19/19 0757 95     Resp 08/19/19 0757 20     Temp 08/19/19 0757 98.5 F (36.9 C)     Temp Source 08/19/19 0757 Oral     SpO2 08/19/19 0757 98 %     Weight 08/19/19 0806 280 lb (127 kg)     Height --      Head Circumference --  Peak Flow --      Pain Score 08/19/19 0805 3     Pain Loc --      Pain Edu? --      Excl. in [place]? --     Vital signs reviewed, nursing assessments reviewed.   Constitutional:   Alert and oriented. Non-toxic appearance.  Morbidly obese Eyes:   Conjunctivae are normal. EOMI. PERRL. ENT      Head:   Normocephalic and atraumatic.      Nose:   No congestion/rhinnorhea.       Mouth/Throat:   Dry mucous membranes, no pharyngeal erythema. No peritonsillar mass.       Neck:   No meningismus. Full ROM. Hematological/Lymphatic/Immunilogical:   No cervical lymphadenopathy. Cardiovascular:   RRR.  Symmetric bilateral radial and DP pulses.  No murmurs. Cap refill less than 2 seconds. Respiratory:   Normal respiratory effort without tachypnea/retractions. Breath sounds are clear and equal bilaterally. No wheezes/rales/rhonchi. Gastrointestinal:   Soft and nontender. Non distended. There is no CVA tenderness.  No rebound, rigidity, or guarding.  Musculoskeletal:   Normal range of motion in all extremities. No joint effusions.  No lower extremity tenderness.  Chronic appearing lymphedema bilateral lower extremities, symmetric calf circumference.. Neurologic:   Normal speech and language.  Motor grossly intact. Ambulatory, uses cane at baseline No acute focal neurologic deficits are appreciated.  Skin:    Skin is warm, dry and intact.  There is crusting brawny skin changes bilateral lower extremities consistent with venous stasis dermatitis. ____________________________________________    LABS (pertinent positives/negatives) (all labs ordered are listed, but only abnormal results are displayed) Labs Reviewed  LACTIC ACID, PLASMA - Abnormal; Notable for the following components:      Result Value   Lactic Acid, Venous 3.7 (*)    All other components within normal limits  COMPREHENSIVE METABOLIC PANEL - Abnormal; Notable for the following components:   Potassium 3.0 (*)    CO2 19 (*)    Glucose, Bld 167 (*)    Calcium 8.7 (*)    Total Protein 8.4 (*)    Total Bilirubin 1.4 (*)    All other components within normal limits  CBC WITH DIFFERENTIAL/PLATELET - Abnormal; Notable for the following components:   Eosinophils Absolute 1.2 (*)    All other components within normal limits  CULTURE, BLOOD (ROUTINE X 2)  CULTURE, BLOOD (ROUTINE X 2)  URINE CULTURE  RESPIRATORY PANEL BY RT PCR (FLU A&B, COVID)  LIPASE, BLOOD  PROCALCITONIN  APTT  PROTIME-INR  CK  LACTIC ACID, PLASMA  URINALYSIS, COMPLETE (UACMP) WITH MICROSCOPIC  TROPONIN I (HIGH SENSITIVITY)    ____________________________________________   EKG    ____________________________________________    RADIOLOGY  DG Chest 1 View  Result Date: 08/19/2019 CLINICAL DATA:  Weakness EXAM: CHEST  1 VIEW COMPARISON:  02/08/2019 FINDINGS: The heart size and mediastinal contours are within normal limits. No focal airspace consolidation, pleural effusion, or pneumothorax. The visualized skeletal structures are unremarkable. IMPRESSION: No active disease. Electronically Signed   By: Davina Poke D.O.   On: 08/19/2019 09:21   CT HEAD WO CONTRAST  Result Date: 08/19/2019 CLINICAL DATA:  Bilateral lower extremity and right facial wounds. Possible neck trauma. Head trauma. EXAM: CT HEAD WITHOUT CONTRAST CT CERVICAL SPINE WITHOUT CONTRAST TECHNIQUE: Multidetector CT imaging of the head and cervical spine was performed following the standard protocol without intravenous contrast. Multiplanar CT image reconstructions of the cervical spine were also generated. COMPARISON:  10/24/2013 FINDINGS: CT HEAD FINDINGS  Brain: No evidence of acute infarction, hemorrhage, extra-axial collection, ventriculomegaly, or mass effect. Generalized cerebral atrophy. Periventricular white matter low attenuation likely secondary to microangiopathy. Vascular: Cerebrovascular atherosclerotic calcifications are noted. Skull: Negative for fracture or focal lesion. Sinuses/Orbits: Visualized portions of the orbits are unremarkable. Visualized portions of the paranasal sinuses are unremarkable. Visualized portions of the mastoid air cells are unremarkable. Other: None. CT CERVICAL SPINE FINDINGS Alignment: Normal. Skull base and vertebrae: No acute fracture. No primary bone lesion or focal pathologic process. Soft tissues and spinal canal: No prevertebral fluid or swelling. No visible canal hematoma. Disc levels: Degenerative disease with disc height loss throughout the cervical spine most severe at C5-6, C6-7 and C7-T1. Anterior  bridging osteophytes throughout the cervical spine. Right uncovertebral degenerative changes C5-6 with foraminal encroachment. Upper chest: Lung apices are clear. Other: No fluid collection or hematoma. IMPRESSION: 1. No acute intracranial pathology. 2. No acute osseous injury of the cervical spine. 3. Cervical spine spondylosis as described above. Electronically Signed   By: Kathreen Devoid   On: 08/19/2019 09:31   CT CERVICAL SPINE WO CONTRAST  Result Date: 08/19/2019 CLINICAL DATA:  Bilateral lower extremity and right facial wounds. Possible neck trauma. Head trauma. EXAM: CT HEAD WITHOUT CONTRAST CT CERVICAL SPINE WITHOUT CONTRAST TECHNIQUE: Multidetector CT imaging of the head and cervical spine was performed following the standard protocol without intravenous contrast. Multiplanar CT image reconstructions of the cervical spine were also generated. COMPARISON:  10/24/2013 FINDINGS: CT HEAD FINDINGS Brain: No evidence of acute infarction, hemorrhage, extra-axial collection, ventriculomegaly, or mass effect. Generalized cerebral atrophy. Periventricular white matter low attenuation likely secondary to microangiopathy. Vascular: Cerebrovascular atherosclerotic calcifications are noted. Skull: Negative for fracture or focal lesion. Sinuses/Orbits: Visualized portions of the orbits are unremarkable. Visualized portions of the paranasal sinuses are unremarkable. Visualized portions of the mastoid air cells are unremarkable. Other: None. CT CERVICAL SPINE FINDINGS Alignment: Normal. Skull base and vertebrae: No acute fracture. No primary bone lesion or focal pathologic process. Soft tissues and spinal canal: No prevertebral fluid or swelling. No visible canal hematoma. Disc levels: Degenerative disease with disc height loss throughout the cervical spine most severe at C5-6, C6-7 and C7-T1. Anterior bridging osteophytes throughout the cervical spine. Right uncovertebral degenerative changes C5-6 with foraminal  encroachment. Upper chest: Lung apices are clear. Other: No fluid collection or hematoma. IMPRESSION: 1. No acute intracranial pathology. 2. No acute osseous injury of the cervical spine. 3. Cervical spine spondylosis as described above. Electronically Signed   By: Kathreen Devoid   On: 08/19/2019 09:31   US Venous Img Lower Bilateral  Result Date: 08/19/2019 CLINICAL DATA:  Bilateral lower extremity pain and swelling EXAM: BILATERAL LOWER EXTREMITY VENOUS DOPPLER ULTRASOUND TECHNIQUE: Gray-scale sonography with graded compression, as well as color Doppler and duplex ultrasound were performed to evaluate the lower extremity deep venous systems from the level of the common femoral vein and including the common femoral, femoral, profunda femoral, popliteal and calf veins including the posterior tibial, peroneal and gastrocnemius veins when visible. The superficial great saphenous vein was also interrogated. Spectral Doppler was utilized to evaluate flow at rest and with distal augmentation maneuvers in the common femoral, femoral and popliteal veins. COMPARISON:  None. FINDINGS: RIGHT LOWER EXTREMITY Common Femoral Vein: No evidence of thrombus. Normal compressibility, respiratory phasicity and response to augmentation. Saphenofemoral Junction: No evidence of thrombus. Normal compressibility and flow on color Doppler imaging. Profunda Femoral Vein: No evidence of thrombus. Normal compressibility and flow on color Doppler imaging. Femoral  Vein: No evidence of thrombus. Normal compressibility, respiratory phasicity and response to augmentation. Popliteal Vein: No evidence of thrombus. Normal compressibility, respiratory phasicity and response to augmentation. Calf Veins: No evidence of thrombus. Normal compressibility and flow on color Doppler imaging. Superficial Great Saphenous Vein: No evidence of thrombus. Normal compressibility. Venous Reflux:  None. Other Findings:  None. LEFT LOWER EXTREMITY Common Femoral Vein:  No evidence of thrombus. Normal compressibility, respiratory phasicity and response to augmentation. Saphenofemoral Junction: No evidence of thrombus. Normal compressibility and flow on color Doppler imaging. Profunda Femoral Vein: No evidence of thrombus. Normal compressibility and flow on color Doppler imaging. Femoral Vein: No evidence of thrombus. Normal compressibility, respiratory phasicity and response to augmentation. Popliteal Vein: No evidence of thrombus. Normal compressibility, respiratory phasicity and response to augmentation. Calf Veins: No evidence of thrombus. Normal compressibility and flow on color Doppler imaging. Superficial Great Saphenous Vein: No evidence of thrombus. Normal compressibility. Venous Reflux:  None. Other Findings: Enlarged left inguinal lymph node with preservation of the echogenic fatty hilum. IMPRESSION: 1. No evidence of deep venous thrombosis in either lower extremity. 2. Enlarged left inguinal lymph node, which may be reactive. Electronically Signed   By: Davina Poke D.O.   On: 08/19/2019 12:11    ____________________________________________   PROCEDURES Procedures  ____________________________________________  DIFFERENTIAL DIAGNOSIS   Dehydration, electrolyte abnormality, intracranial hemorrhage, C-spine fracture, anemia, UTI, pneumonia  CLINICAL IMPRESSION / ASSESSMENT AND PLAN / ED COURSE  Medications ordered in the ED: Medications  ammonium lactate (LAC-HYDRIN) 12 % lotion (has no administration in time range)  citalopram (CELEXA) tablet 20 mg (20 mg Oral Given 08/19/19 1042)  metFORMIN (GLUCOPHAGE) tablet 500 mg (500 mg Oral Given 08/19/19 1042)  lactated ringers bolus 1,000 mL (0 mLs Intravenous Stopped 08/19/19 1041)    Pertinent labs & imaging results that were available during my care of the patient were reviewed by me and considered in my medical decision making (see chart for details).   Bradley Griffin was evaluated in Emergency  Department on 08/19/2019 for the symptoms described in the history of present illness. He was evaluated in the context of the global COVID-19 pandemic, which necessitated consideration that the patient might be at risk for infection with the SARS-CoV-2 virus that causes COVID-19. Institutional protocols and algorithms that pertain to the evaluation of patients at risk for COVID-19 are in a state of rapid change based on information released by regulatory bodies including the CDC and federal and state organizations. These policies and algorithms were followed during the patient's care in the ED.   Pt p/w weakness, multiple falls, and head injury at home.  Initial vital signs are normal. BP 94/54, MAP = 67. Doubt sepsis based on initial presentation.  More likely decompensation of chronic illnesses due to noncompliance with medication, inability to sufficiently care for himself at home.  Will pursue broad lab workup, CT head/neck for trauma eval.    Pt is morbidly obese with BMI 38. If workup reveals evidence of sepsis and shock, we will use IBW of 78kg for fluid calculation, which would indicate total IVF volume of 2500 mL.  Clinical Course as of Aug 18 1308  Tue Aug 19, 2019  1020 Initial labs unremarkable except for lactate of 3.7 which I think is due to dehydration.  Procalcitonin is negative, chest x-ray unremarkable, CT head and neck unremarkable.  Doubt sepsis.   [PS]    Clinical Course User Index [PS] Carrie Mew, MD     ----------------------------------------- 1:10  PM on 08/19/2019 -----------------------------------------  Vital signs have remained normal after hydration.  Doubt cellulitis.  Ultrasound bilateral lower extremity negative.  Labs unremarkable except for elevated lactate due to dehydration.  Not septic.  Awaiting social work evaluation.  ____________________________________________   FINAL CLINICAL IMPRESSION(S) / ED DIAGNOSES    Final diagnoses:  Chronic  acquired lymphedema  Venous stasis dermatitis of both lower extremities  Generalized weakness     ED Discharge Orders    None      Portions of this note were generated with dragon dictation software. Dictation errors may occur despite best attempts at proofreading.   Carrie Mew, MD 08/19/19 1311

## 2019-08-20 DIAGNOSIS — R531 Weakness: Secondary | ICD-10-CM | POA: Diagnosis not present

## 2019-08-20 MED ORDER — ACETAMINOPHEN 325 MG PO TABS
650.0000 mg | ORAL_TABLET | Freq: Once | ORAL | Status: AC
  Administered 2019-08-20: 650 mg via ORAL
  Filled 2019-08-20: qty 2

## 2019-08-20 MED ORDER — SODIUM CHLORIDE 0.9 % IV BOLUS
1000.0000 mL | Freq: Once | INTRAVENOUS | Status: AC
  Administered 2019-08-20: 13:00:00 1000 mL via INTRAVENOUS

## 2019-08-20 NOTE — ED Notes (Signed)
Pt ate 100% of breakfast.

## 2019-08-20 NOTE — ED Notes (Signed)
Hydrocortisone and hydrocerin cream arrived from pharmacy. This RN applied to right leg, pt's back, abdomen, and bilateral upper extremities.

## 2019-08-20 NOTE — ED Notes (Signed)
Pt given lunch tray and cranberry juice

## 2019-08-20 NOTE — ED Notes (Signed)
Pt given cranberry juice per request °

## 2019-08-20 NOTE — TOC Progression Note (Signed)
Transition of Care [facility]) - Progression Note    Patient Details  Name: Bradley Griffin MRN: [id number] Date of Birth: 04/29/1951  [facility]) CM/SW Contact  [name], RN Phone Number: 08/20/2019, 5:09 PM  Clinical Narrative:    RN CM left VM for admission coordinator @ [phone] requesting callback to discuss transfer to [facility]. If no reply by morning patient is in agreeance with [facility].    Expected Discharge Plan: [place]    Expected Discharge Plan and Services Expected Discharge Plan: [place] In-house Referral: Clinical Social Work     Living arrangements for the past 2 months: Apartment                                       Social Determinants of Health (SDOH) Interventions    Readmission Risk Interventions No flowsheet data found.

## 2019-08-20 NOTE — ED Notes (Signed)
Pt amulated to toilet with walker to have a bowel movement.

## 2019-08-20 NOTE — ED Notes (Signed)
Pt ate 100% of lunch. Gave pt 2 diet gingerales per request

## 2019-08-20 NOTE — ED Notes (Signed)
Pt given breakfast tray, diet sprite and water

## 2019-08-20 NOTE — ED Notes (Signed)
Hydrocortisone and hydrocerin cream applied to the left leg. Not enough medication to complete the rest of the body. Pharmacy called for more

## 2019-08-20 NOTE — ED Notes (Signed)
Pt given meal tray at this time 

## 2019-08-20 NOTE — TOC Progression Note (Signed)
Transition of Care [facility]) - Progression Note    Patient Details  Name: Bradley Griffin MRN: [id number] Date of Birth: May 29, 1951  [facility]) CM/SW Contact  [name], RN Phone Number: 08/20/2019, 10:48 AM  Clinical Narrative:    Spoke to patient and discussed bed offers from multiple facilities for LTC placement. Patient selected [facility] as his first choice and [facility] as a second. RN CM left VM message with Blanch [employer] in admissions @ [facility].    Expected Discharge Plan: [place]    Expected Discharge Plan and Services Expected Discharge Plan: [place] In-house Referral: Clinical Social Work     Living arrangements for the past 2 months: Apartment                                       Social Determinants of Health (SDOH) Interventions    Readmission Risk Interventions No flowsheet data found.

## 2019-08-20 NOTE — ED Notes (Signed)
Pt given dinner tray and 2 cranberry juices

## 2019-08-20 NOTE — ED Notes (Signed)
Pt moved to hospital bed. Linens and absorbant pads changed that were under patient's legs

## 2019-08-20 NOTE — ED Notes (Signed)
Verbal order given by Dr. Jimmye Norman for tylenol 650 mg tab po stat for headache and 1 liter NS bolus IV

## 2019-08-20 NOTE — ED Provider Notes (Signed)
-----------------------------------------   6:08 AM on 08/20/2019 -----------------------------------------   Blood pressure 117/73, pulse 97, temperature 98.5 F (36.9 C), temperature source Oral, resp. rate 18, weight 127 kg, SpO2 97 %.  The patient is calm and cooperative at this time.  There have been no acute events since the last update.  Awaiting disposition plan from Social Work team(s).   Paulette Blanch, MD 08/20/19 [phone]

## 2019-08-21 DIAGNOSIS — R531 Weakness: Secondary | ICD-10-CM | POA: Diagnosis not present

## 2019-08-21 LAB — GLUCOSE, CAPILLARY
Glucose-Capillary: 100 mg/dL — ABNORMAL HIGH (ref 70–99)
Glucose-Capillary: 131 mg/dL — ABNORMAL HIGH (ref 70–99)

## 2019-08-21 MED ORDER — HYDROCORTISONE 0.5 % EX OINT
TOPICAL_OINTMENT | Freq: Two times a day (BID) | CUTANEOUS | Status: DC
  Filled 2019-08-21 (×3): qty 28.35

## 2019-08-21 NOTE — ED Notes (Signed)
This [facility] pharmacy requesting medication due at 2200.

## 2019-08-21 NOTE — ED Notes (Signed)
Pt provided with lunch tray from cafeteria and fluids.  Pt st "I don't want to eat this do you have a [place] sandwich". This RN provide pt with a [place] sandwich and 2 diet ginger ale per pt's request.

## 2019-08-21 NOTE — TOC Progression Note (Signed)
Transition of Care [facility]) - Progression Note    Patient Details  Name: MUNG HOUGE MRN: [id number] Date of Birth: 05/23/1951  Transition of Care [facility]) CM/SW [place], RN Phone Number: 08/21/2019, 2:49 PM  Clinical Narrative:    Spoke with Kathy @ [facility] who are no longer offering bed as they are unable to offer a LTC bed placement. Patient had second choice of [facility]. RN CM left VM for Dianne @ [facility] for follow up on bed offer.    Expected Discharge Plan: [place]    Expected Discharge Plan and Services Expected Discharge Plan: [place] In-house Referral: Clinical Social Work     Living arrangements for the past 2 months: Apartment                                       Social Determinants of Health (SDOH) Interventions    Readmission Risk Interventions No flowsheet data found.

## 2019-08-21 NOTE — ED Notes (Signed)
Pt provided with dinner tray and water.

## 2019-08-21 NOTE — TOC Progression Note (Signed)
Transition of Care [facility]) - CM/SW Discharge Note   Patient Details  Name: Bradley Griffin MRN: [id number] Date of Birth: 06-03-1950  [facility] Care [facility]) CM/SW Contact:  [name], RN Phone Number: 08/21/2019, 11:25 AM   Clinical Narrative:    Damaris Schooner to Juliann Pulse @ [facility] states she is going to discuss with corporate potential 3 day waiver for Medicare for skilled needs prior to going LTC under Medicaid. Will call back with update.          Patient Goals and CMS Choice Patient states their goals for this hospitalization and ongoing recovery are:: Get placed into long term care facility      Discharge Placement                       Discharge Plan and Services In-house Referral: Clinical Social Work                                   Social Determinants of Health (SDOH) Interventions     Readmission Risk Interventions No flowsheet data found.

## 2019-08-21 NOTE — ED Notes (Addendum)
Asked pt if he would like a clean gown. Pt states no thank you Im fine. Pt dry at this time

## 2019-08-21 NOTE — ED Notes (Signed)
Pt given breakfast tray and 2 cranberry juices. Pt sitting up eating at this time.

## 2019-08-21 NOTE — ED Notes (Signed)
This RN spoke with Juanda Crumble, pharmacy requesting med missing hydrocortisone 0.5%. Per prarmacy tech st medication will be sent now.

## 2019-08-21 NOTE — TOC Progression Note (Signed)
Transition of Care [facility]) - Progression Note    Patient Details  Name: TAVAR LEVANT MRN: [id number] Date of Birth: 05-21-1951  Transition of Care [facility]) CM/SW [place], RN Phone Number: 08/21/2019, 10:49 AM  Clinical Narrative:    Damaris Schooner to Blanch Media @ [place] who advised contact would be Harriet Pho, [phone]. RN CM Left VM for [facility] requesting callback as patient is ready for transfer today.    Expected Discharge Plan: [place]    Expected Discharge Plan and Services Expected Discharge Plan: [place] In-house Referral: Clinical Social Work     Living arrangements for the past 2 months: Apartment                                       Social Determinants of Health (SDOH) Interventions    Readmission Risk Interventions No flowsheet data found.

## 2019-08-21 NOTE — ED Notes (Addendum)
Messaged Case manager  Andreas Newport to find out update on placement status for pt.  Waiting for response.

## 2019-08-21 NOTE — ED Provider Notes (Signed)
12:16 AM   Blood pressure 118/77, pulse 91, temperature 98.3 F (36.8 C), temperature source Oral, resp. rate 18, weight 127 kg, SpO2 99 %.  The patient is calm and cooperative at this time.  There have been no acute events since the last update.  Awaiting disposition plan from social work team   Vanessa [place], MD 08/21/19 [phone]

## 2019-08-21 NOTE — ED Notes (Signed)
Pt informed of needing urine sample

## 2019-08-21 NOTE — ED Notes (Signed)
Called pharmacy to request more eucerin cream for pt.

## 2019-08-21 NOTE — ED Notes (Signed)
Pt able to a,mbualte with walker to toilet to void and BM. Pt back on bed safely.

## 2019-08-21 NOTE — ED Notes (Signed)
Pt given water at this time 

## 2019-08-21 NOTE — ED Notes (Signed)
Pt ambulatory to toilet with walker. This RN at bedside.

## 2019-08-21 NOTE — ED Notes (Signed)
Pt assisted to toilet with walker. Well tolerated. Unable to obtain urine sample due to contamination with fecal matter via aggressive bowel movement

## 2019-08-22 DIAGNOSIS — R531 Weakness: Secondary | ICD-10-CM | POA: Diagnosis not present

## 2019-08-22 MED ORDER — HYDROCORTISONE 0.5 % EX CREA
TOPICAL_CREAM | Freq: Two times a day (BID) | CUTANEOUS | Status: DC
  Filled 2019-08-22: qty 85.05
  Filled 2019-08-22: qty 28.35
  Filled 2019-08-22: qty 85.05

## 2019-08-22 MED ORDER — HYDROCORTISONE 1 % EX CREA
TOPICAL_CREAM | Freq: Two times a day (BID) | CUTANEOUS | Status: DC
  Administered 2019-08-22: 1 via TOPICAL
  Filled 2019-08-22 (×3): qty 28

## 2019-08-22 NOTE — ED Notes (Signed)
Pt ambulated to toilet in room with help from EDT. Pt did have unknown amount of a BM and unknown amount of Urine. 69mL of urine was emptied from urinal at this time. Pt  Is now resting in bed with no complaints.

## 2019-08-22 NOTE — ED Notes (Signed)
Introduced self to pt. Pt in good spirits and just finished working with PT/OT staff. Pt denies pain or any needs currently. States "it felt good to get up and walk". Bed locked low. Rail up. Call bell within reach.

## 2019-08-22 NOTE — Progress Notes (Signed)
Physical Therapy Treatment Patient Details Name: Bradley Griffin MRN: [id number] DOB: 18-Jun-1950 Today's Date: 08/22/2019    History of Present Illness Pt is 69 yo male that presented to ED after a fall at home where pt reported he did hit his head. PMH of basal cell carcinoma of face lateral to R eye, bipolar disorder, DM, depression.    PT Comments    Patient alert, in much improved spirits compared to previous session. The patient did demonstrate progression towards goals as well. Supine <> sit with supervision and HOB elevated, sit <> stand from elevated bed with RW CGA, from standard chair minA and pt relied heavily on pulling on RW. The patient ambulated in the room with CGA and RW, cueing for safer turns 1-2 instances of LOB, minA-CGA to correct. Trialed use of SPC but demonstrated increased unsteadiness. Pt in bed with all needs in reach, OT entering. Current plan remains appropriate, the patient would benefit from further skilled PT intervention to maximize safety, mobility, and independence.   Follow Up Recommendations  SNF     Equipment Recommendations  None recommended by PT    Recommendations for Other Services OT consult     Precautions / Restrictions Precautions Precautions: None Precaution Comments: pt with significant skin concerns of BLE Restrictions Weight Bearing Restrictions: No    Mobility  Bed Mobility Overal bed mobility: Needs Assistance Bed Mobility: Supine to Sit;Sit to Supine     Supine to sit: Supervision Sit to supine: HOB elevated;Min assist      Transfers Overall transfer level: Needs assistance Equipment used: Rolling walker (2 wheeled) Transfers: Sit to/from Stand Sit to Stand: Min guard;Min assist         General transfer comment: assistance needed from low chair in room, CGA from EOB  Ambulation/Gait Ambulation/Gait assistance: Min guard Gait Distance (Feet): 50 Feet Assistive device: Rolling walker (2 wheeled)        General Gait Details: Pt able to ambulate in room with CGA and RW, trialed SPC but demonstrated increased unsteadiness. Cued for slower turning to maximize safety.   Stairs             Wheelchair Mobility    Modified Rankin (Stroke Patients Only)       Balance Overall balance assessment: Needs assistance Sitting-balance support: Feet supported Sitting balance-Leahy Scale: Fair       Standing balance-Leahy Scale: Poor                              Cognition Arousal/Alertness: Awake/alert Behavior During Therapy: WFL for tasks assessed/performed Overall Cognitive Status: Within Functional Limits for tasks assessed                                        Exercises      General Comments        Pertinent Vitals/Pain Pain Assessment: No/denies pain    Home Living                      Prior Function            PT Goals (current goals can now be found in the care plan section) Progress towards PT goals: Progressing toward goals    Frequency    Min 2X/week      PT Plan Current plan remains appropriate  Co-evaluation              AM-PAC PT "6 Clicks" Mobility   Outcome Measure  Help needed turning from your back to your side while in a flat bed without using bedrails?: A Little Help needed moving from lying on your back to sitting on the side of a flat bed without using bedrails?: A Little Help needed moving to and from a bed to a chair (including a wheelchair)?: A Little Help needed standing up from a chair using your arms (e.g., wheelchair or bedside chair)?: A Little Help needed to walk in hospital room?: A Little Help needed climbing 3-5 steps with a railing? : Total 6 Click Score: 16    End of Session Equipment Utilized During Treatment: Gait belt Activity Tolerance: Patient tolerated treatment well Patient left: in bed;with bed alarm set Nurse Communication: Mobility status PT Visit Diagnosis:  Other abnormalities of gait and mobility (R26.89);Muscle weakness (generalized) (M62.81);Repeated falls (R29.6)     Time: [id number] PT Time Calculation (min) (ACUTE ONLY): 23 min  Charges:  $Therapeutic Exercise: 23-37 mins                     Lieutenant Diego PT, DPT 1:04 PM,08/22/19

## 2019-08-22 NOTE — TOC Progression Note (Addendum)
Transition of Care [facility]) - Progression Note    Patient Details  Name: Bradley Griffin MRN: [id number] Date of Birth: June 22, 1950  Transition of Care [facility]) CM/SW [place], RN Phone Number: 08/22/2019, 2:56 PM  Clinical Narrative:    Spoke with Dianne-pt approved for admission to [facility]. PASRR pending and COVID current. EMS unable to transport out of county. [facility] transport contacted and scheduled to pick patient up at 4pm. ED RN and secretary notified of pending transport. Patient to admit to room 213. Report to [phone].   Expected Discharge Plan: [place]    Expected Discharge Plan and Services Expected Discharge Plan: [place] In-house Referral: Clinical Social Work     Living arrangements for the past 2 months: Apartment                                       Social Determinants of Health (SDOH) Interventions    Readmission Risk Interventions No flowsheet data found.

## 2019-08-22 NOTE — ED Notes (Signed)
Requested 2200 hydrocortisone ointment and more Eucerin from [place] in pharmacy.

## 2019-08-22 NOTE — Discharge Instructions (Addendum)
Continue all home medications as listed on paperwork.  For your leg swelling, I'd recommend regular wound and nursing care with compression stockings.  No new medications will be started based on your ER stay.

## 2019-08-22 NOTE — TOC Progression Note (Addendum)
Transition of Care [facility]) - Progression Note    Patient Details  Name: Bradley Griffin MRN: [id number] Date of Birth: July 15, 1950  Transition of Care [facility]) CM/SW [place], RN Phone Number: 08/22/2019, 9:15 AM  Clinical Narrative:    Spoke to [facility] @ [facility]. Advised possible waiver for SNF prior to LTC due to PT/OT recommendations. RN CM will work on [place] Pending review and COVID completed 08/19/19   Expected Discharge Plan: [place]    Expected Discharge Plan and Services Expected Discharge Plan: [place] In-house Referral: Clinical Social Work     Living arrangements for the past 2 months: Apartment                                       Social Determinants of Health (SDOH) Interventions    Readmission Risk Interventions No flowsheet data found.

## 2019-08-22 NOTE — ED Notes (Signed)
Report given to Rodman Pickle, Therapist, sports at [facility] in [place]. Pt to go to room 213 via [facility]. [phone].

## 2019-08-22 NOTE — ED Notes (Signed)
3rd attempt at reporting to [facility] in [place].

## 2019-08-22 NOTE — ED Provider Notes (Signed)
-----------------------------------------   6:10 AM on 08/22/2019 -----------------------------------------   Blood pressure 133/89, pulse 89, temperature 98 F (36.7 C), temperature source Oral, resp. rate 18, height 5\' 11"  (1.803 m), weight 127 kg, SpO2 97 %.  The patient is calm and cooperative at this time.  There have been no acute events since the last update.  Awaiting disposition plan from Social Work team(s).   Paulette Blanch, MD 08/22/19 [phone]

## 2019-08-22 NOTE — ED Notes (Signed)
Left over hydrocortisone cream sent with pt.

## 2019-08-22 NOTE — ED Provider Notes (Addendum)
Patient has been seen and accepted to skilled nursing facility [facility].  Lab work is all up-to-date.  Patient has been hemodynamically stable here.  Blood sugars are controlled.  Covid negative.  No apparent acute emergent medical condition.  Will discharge to SNF.   Duffy Bruce, MD 08/22/19 [id number]    Duffy Bruce, MD 08/22/19 [phone]

## 2019-08-22 NOTE — Progress Notes (Signed)
Occupational Therapy Treatment Patient Details Name: Bradley Griffin MRN: [id number] DOB: 1951/01/04 Today's Date: 08/22/2019    History of present illness Pt is 69 yo male that presented to ED after a fall at home where pt reported he did hit his head. PMH of basal cell carcinoma of face lateral to R eye, bipolar disorder, DM, depression.   OT comments  Bradley Griffin was seen for OT treatment on this date. Upon arrival to room pt was awake, seated upright in bed having just completed session with PT. Pt A&O x 4, denied pain, and agreeable to tx. Pt instructed in falls prevention strategies and safe RW technique. Pt required CGA for standing ADL tasks 2/2 balance deficits and decreased safety awareness - pt demonstrated multiple minor LOBs during standing grooming task, able to self-correct, but unaware of deficits. MIN A toileting at standard commode c SBA for perihygiene. Pt verbalized understanding of instruction provided. Pt making good progress toward goals. Pt continues to benefit from skilled OT services to maximize return to PLOF and minimize risk of future falls, injury, caregiver burden, and readmission. Will continue to follow POC. Discharge recommendation remains appropriate.    Follow Up Recommendations  SNF    Equipment Recommendations  3 in 1 bedside commode    Recommendations for Other Services      Precautions / Restrictions Precautions Precautions: None Precaution Comments: pt with significant skin concerns of BLE Restrictions Weight Bearing Restrictions: No       Mobility Bed Mobility Overal bed mobility: Needs Assistance Bed Mobility: Supine to Sit;Sit to Supine     Supine to sit: Supervision;HOB elevated Sit to supine: Supervision;HOB elevated      Transfers Overall transfer level: Needs assistance Equipment used: Rolling walker (2 wheeled) Transfers: Sit to/from Stand Sit to Stand: Min assist         General transfer comment: Assist needed to brace RW  as pt unable to follow cues to push up from bed, instead pulling on RW to achieve standing position    Balance Overall balance assessment: Needs assistance Sitting-balance support: Feet supported Sitting balance-Leahy Scale: Fair     Standing balance support: Bilateral upper extremity supported;During functional activity Standing balance-Leahy Scale: Poor Standing balance comment: Pt demonstrated intermittent small LOBs - able to self correct during standing ADL tasks.                            ADL either performed or assessed with clinical judgement   ADL Overall ADL's : Needs assistance/impaired                                       General ADL Comments: CGA + RW + R rail toileting at commode - SBA for perihygiene. CGA + RW washing face and don/doff glassess standing sinkside. Pt required VCs for safety and RW technique t/o ADLs this session     Vision       Perception     Praxis      Cognition Arousal/Alertness: Awake/alert Behavior During Therapy: WFL for tasks assessed/performed Overall Cognitive Status: Within Functional Limits for tasks assessed                                          Exercises Other  Exercises Other Exercises: Pt educated re: falls prevention, DME recommendations, importance of OOB for functional strengthening Other Exercises: Face washing x2, toileting at commode, don/doff glasses   Shoulder Instructions       General Comments      Pertinent Vitals/ Pain       Pain Assessment: No/denies pain  Home Living                                          Prior Functioning/Environment              Frequency  Min 2X/week        Progress Toward Goals  OT Goals(current goals can now be found in the care plan section)  Progress towards OT goals: Progressing toward goals  Acute Rehab OT Goals Patient Stated Goal: to go home OT Goal Formulation: With patient Time For Goal  Achievement: 09/02/19 Potential to Achieve Goals: Fair ADL Goals Pt Will Perform Upper Body Dressing: with min assist;sitting Pt Will Perform Lower Body Dressing: with mod assist;with adaptive equipment;sit to/from stand(c LRAD & AE PRN) Pt Will Transfer to Toilet: with max assist;bedside commode;stand pivot transfer  Plan Discharge plan remains appropriate;Frequency remains appropriate    Co-evaluation                 AM-PAC OT "6 Clicks" Daily Activity     Outcome Measure   Help from another person eating meals?: None Help from another person taking care of personal grooming?: A Little Help from another person toileting, which includes using toliet, bedpan, or urinal?: A Little Help from another person bathing (including washing, rinsing, drying)?: A Lot Help from another person to put on and taking off regular upper body clothing?: A Little Help from another person to put on and taking off regular lower body clothing?: A Lot 6 Click Score: 17    End of Session Equipment Utilized During Treatment: Rolling walker  OT Visit Diagnosis: Repeated falls (R29.6);Other abnormalities of gait and mobility (R26.89)   Activity Tolerance Patient tolerated treatment well   Patient Left in bed;with call bell/phone within reach;with bed alarm set;with nursing/sitter in room(RN in room at end of session)   Nurse Communication          [id number] OT Time Calculation (min): 16 min  Charges: OT General Charges $OT Visit: 1 Visit OT Treatments $Self Care/Home Management : 8-22 mins  Dessie Coma, M.S. OTR/L  08/22/19, 1:59 PM

## 2019-08-22 NOTE — ED Notes (Signed)
Pt's legs wiped down with warm wash clothes as requested. Flaky substance coming loose. Cream applied to pt's legs.

## 2019-08-22 NOTE — ED Notes (Signed)
Applied hydrocortisone cream to patient's legs, this nurse made pharmacy aware that we would need more cream to cover all the areas needed.

## 2019-08-22 NOTE — ED Notes (Signed)
Pt given lunch tray by [facility] pt relations staff.

## 2019-08-22 NOTE — ED Notes (Addendum)
Pt assisted up to bedside toilet. Steady. Bed pads changed. Educated again about metformin as he had questions concerning diarrhea. Hydrocortisone 1% cream not yet available. Checked station, tubes, and pyxis.

## 2019-08-24 LAB — CULTURE, BLOOD (ROUTINE X 2)
Culture: NO GROWTH
Culture: NO GROWTH
Special Requests: ADEQUATE

## 2020-03-23 IMAGING — DX PORTABLE CHEST - 1 VIEW
2 series · 2 of 2 positions shown · non-contrast
Comparison: 10/28/2013

CLINICAL DATA: Weakness

EXAM:
PORTABLE CHEST 1 VIEW

[chest ap (1 of 2)]
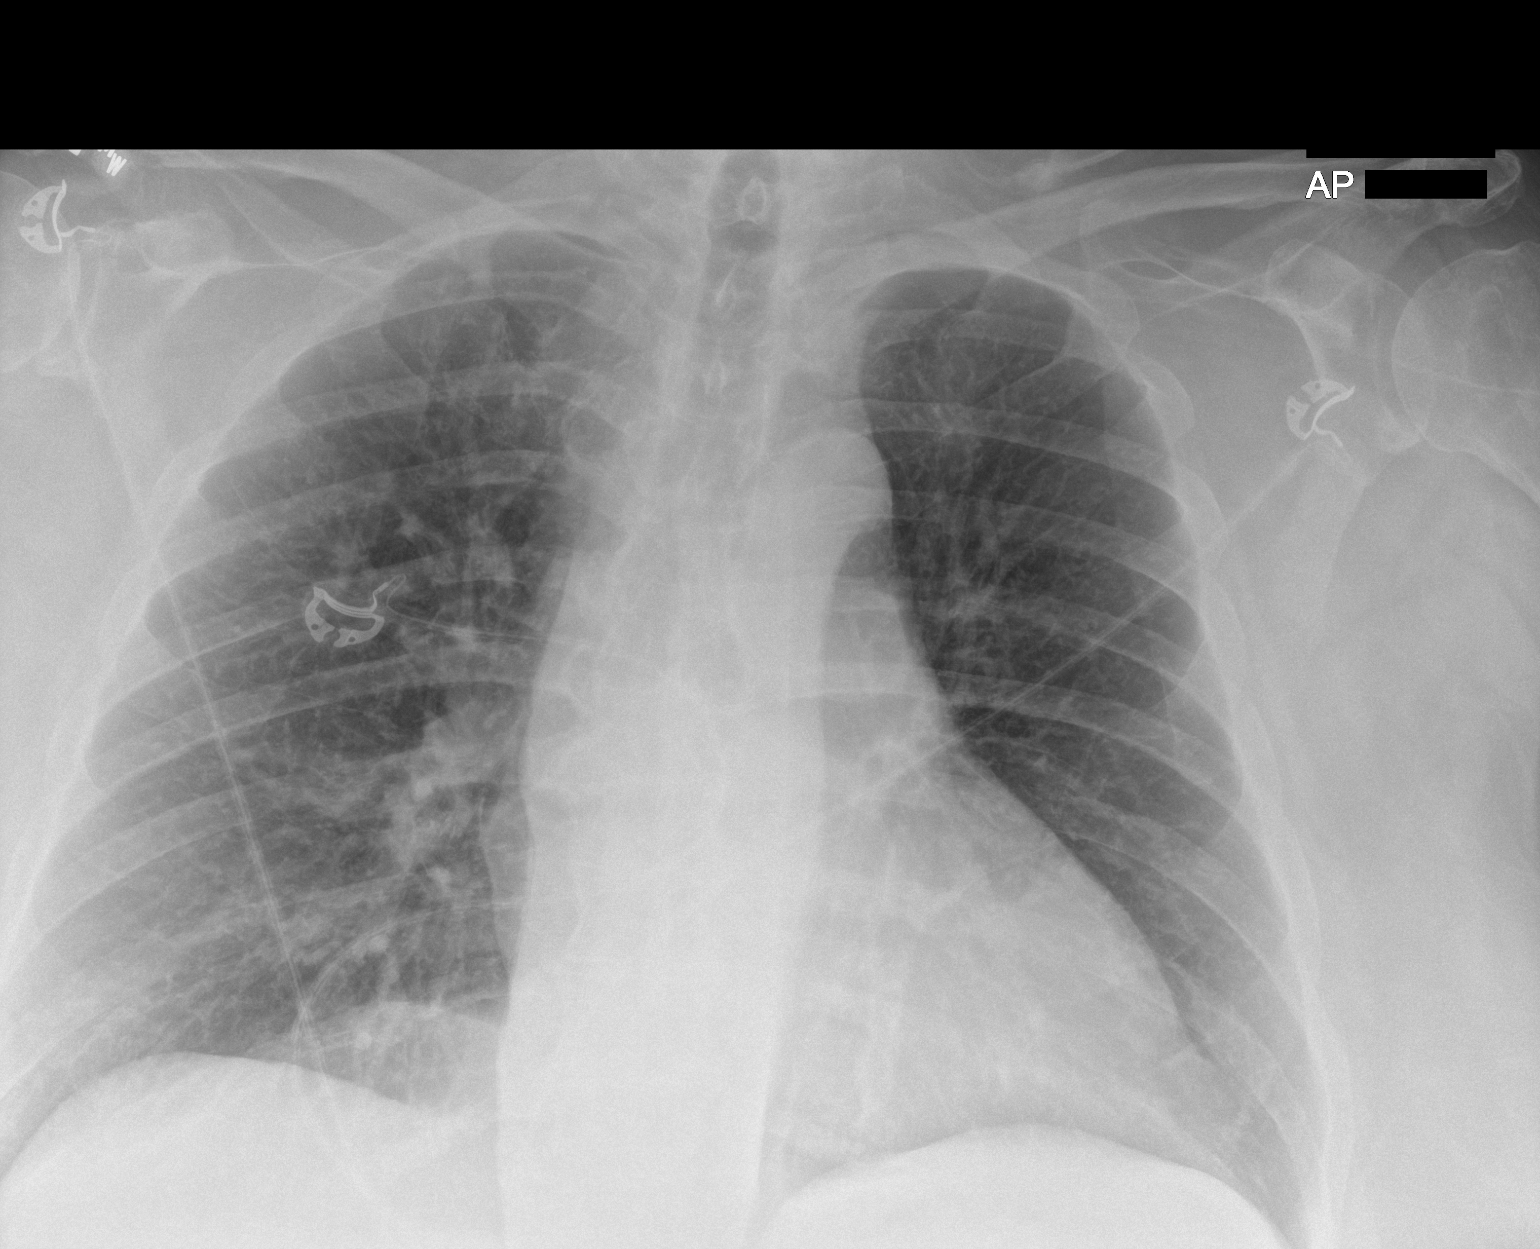

[chest ap (2 of 2)]
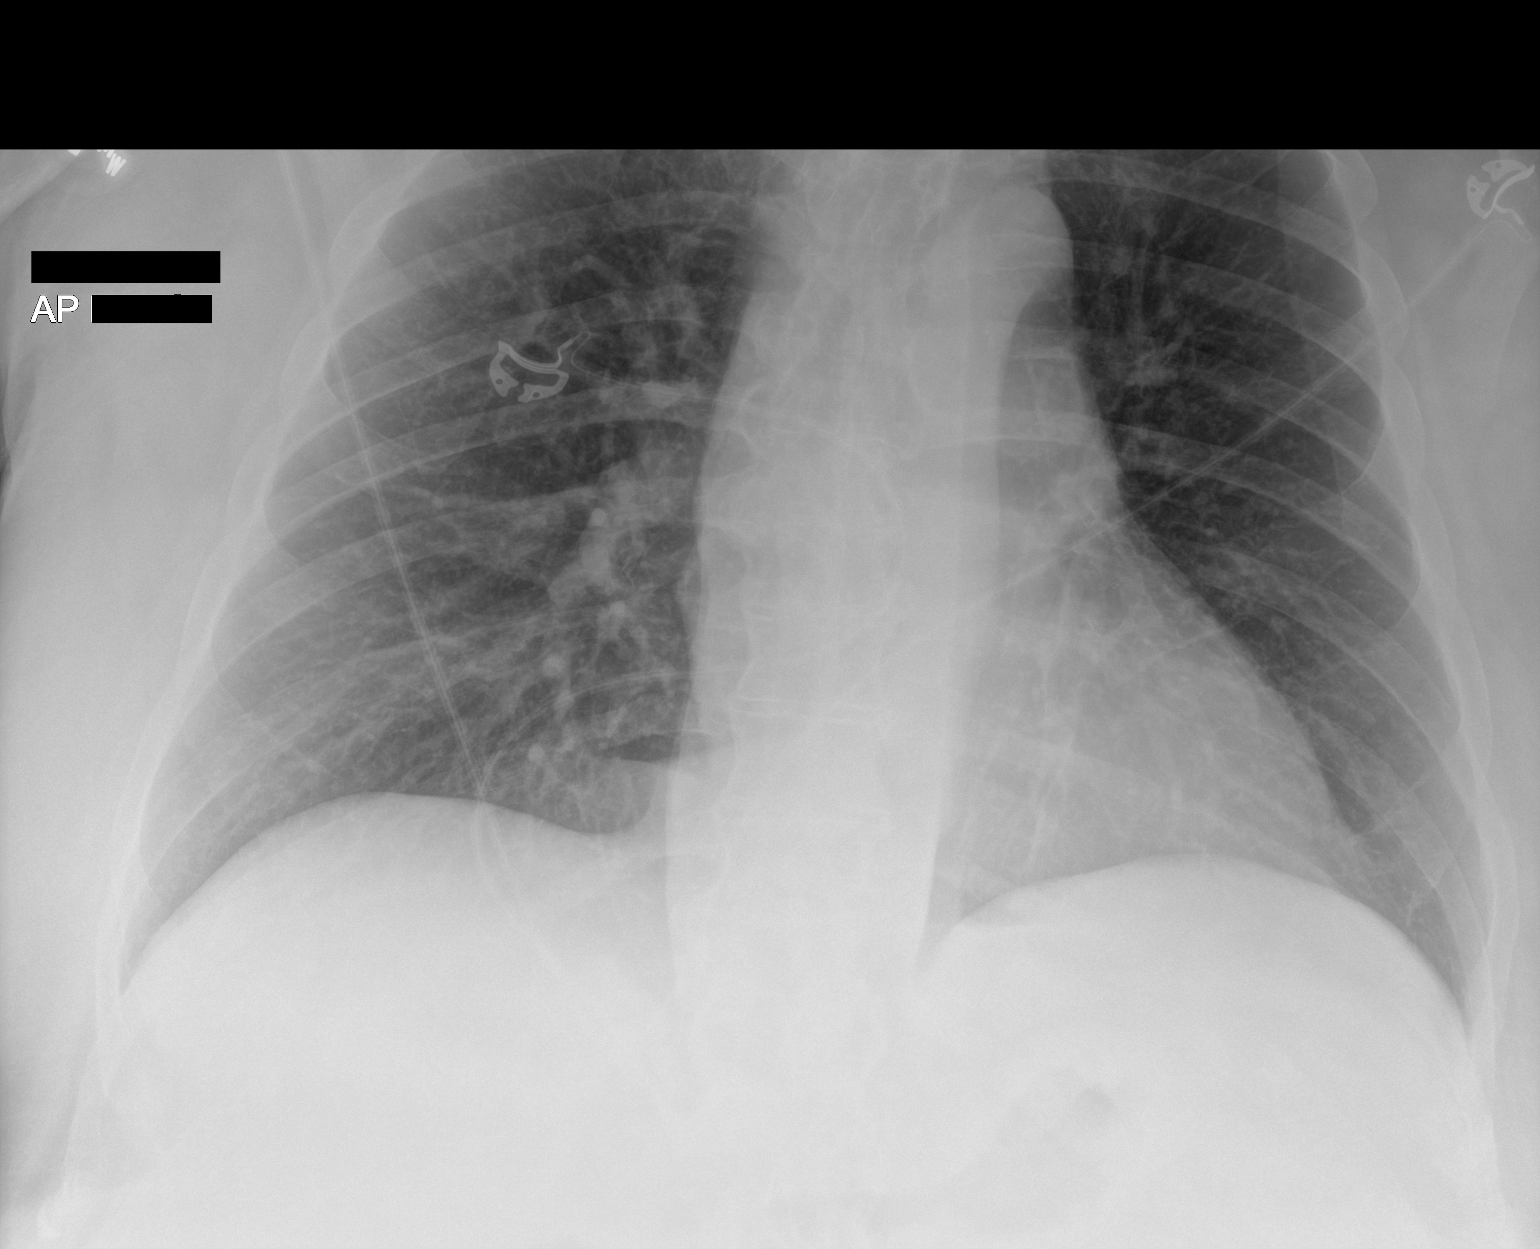

[2 of 2 positions shown; findings below may reference images not displayed]

FINDINGS: The heart size and mediastinal contours are within normal limits.
Both lungs are clear. The visualized skeletal structures are
unremarkable.
IMPRESSION: No active disease.

## 2021-02-20 IMAGING — CT CT HEAD W/O CM
3 series · 15 of 47 positions shown, 18 images · non-contrast
Comparison: 10/24/2013

CLINICAL DATA: Bilateral lower extremity and right facial wounds.
Possible neck trauma. Head trauma.

EXAM:
CT HEAD WITHOUT CONTRAST
CT CERVICAL SPINE WITHOUT CONTRAST
TECHNIQUE: Multidetector CT imaging of the head and cervical spine was
performed following the standard protocol without intravenous
contrast. Multiplanar CT image reconstructions of the cervical spine
were also generated.

[Series 2: head wo · axial · 0.45mm/px · z∈[-204,-74]mm · 9 of 32 slices shown, 12 images]
[im 3/32  brain]
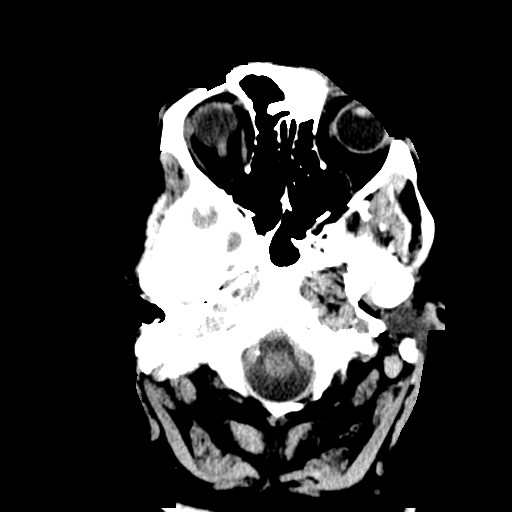
[im 3/32  bone]
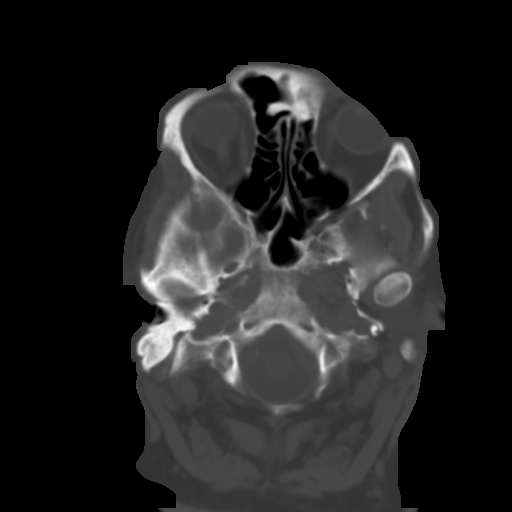
[im 6/32  brain]
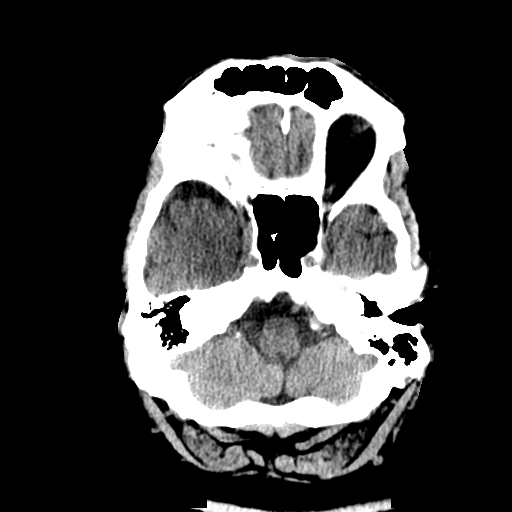
[im 9/32  brain]
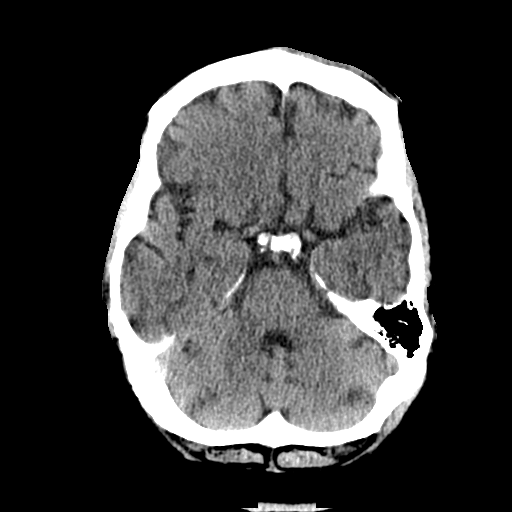
[im 12/32  brain]
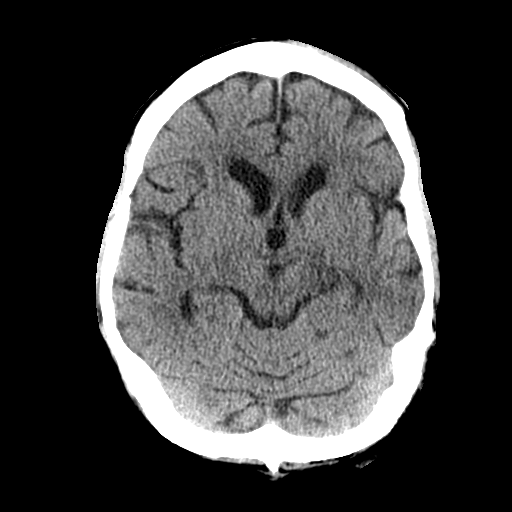
[im 17/32  brain]
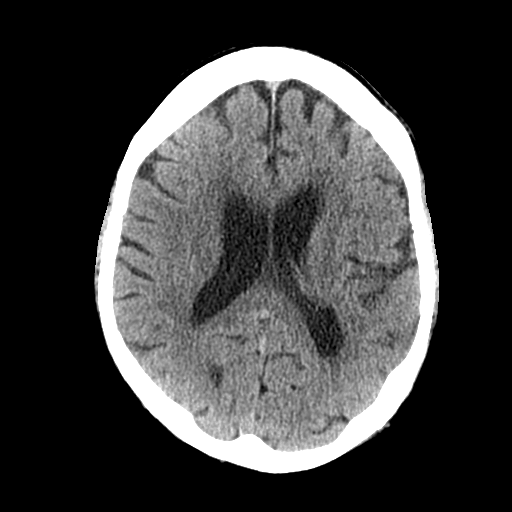
[im 17/32  bone]
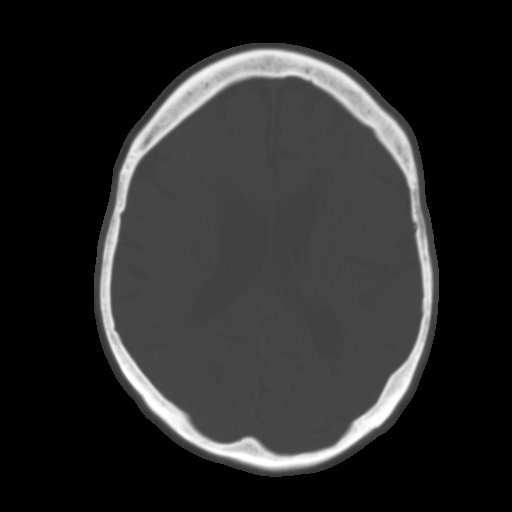
[im 20/32  brain]
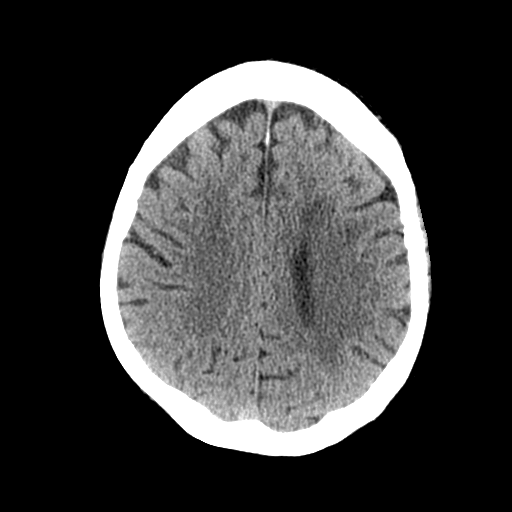
[im 23/32  brain]
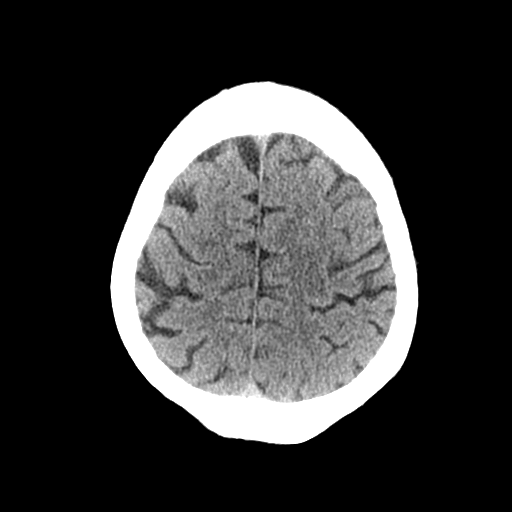
[im 26/32  brain]
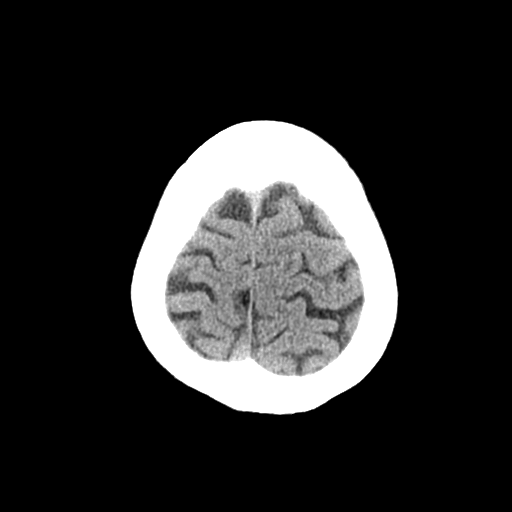
[im 29/32  brain]
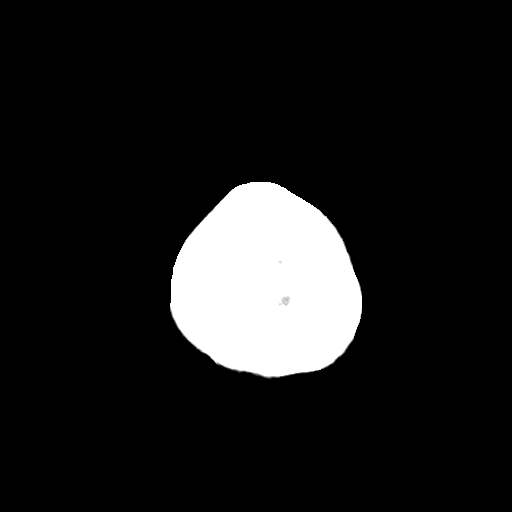
[im 29/32  bone]
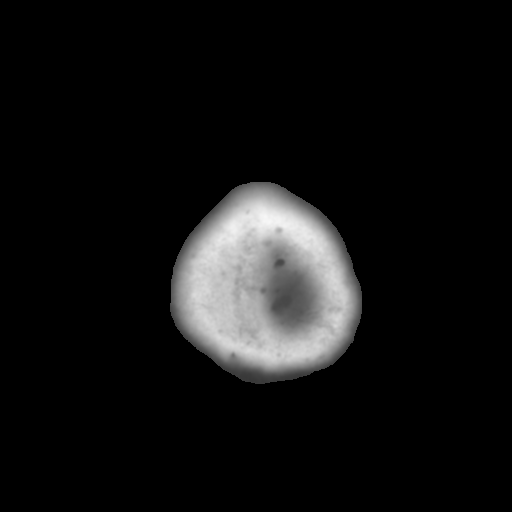

[Series 4: coronal soft tissue · coronal · 0.35mm/px · 3 of 71 slices shown]
[im 24/71  brain]
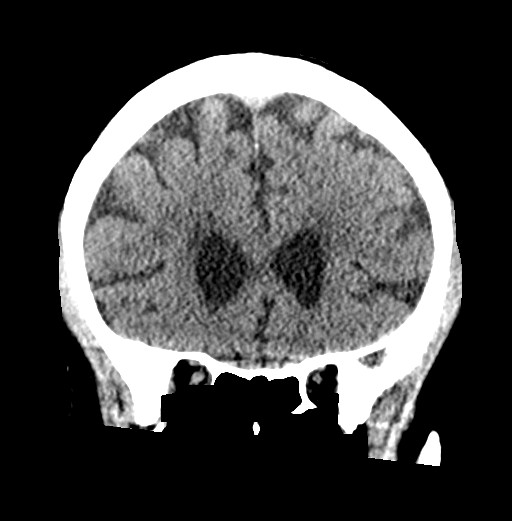
[im 32/71  brain]
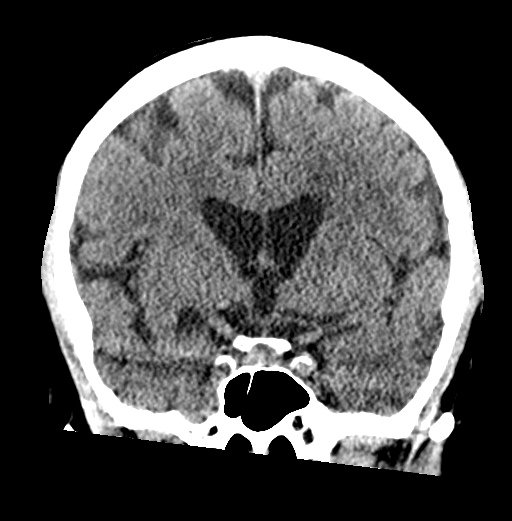
[im 39/71  brain]
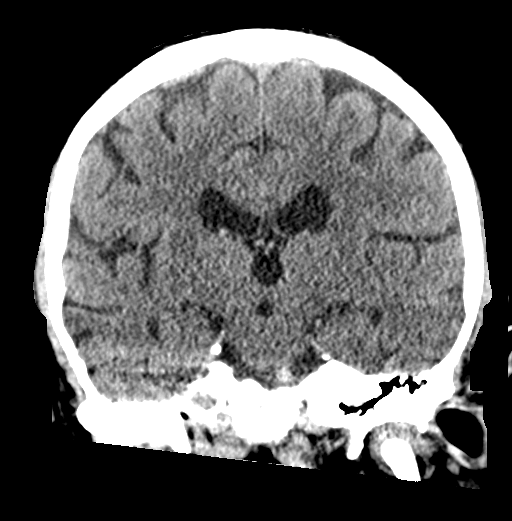

[Series 5: sagittal soft tissue · sagittal · 0.35mm/px · 3 of 59 slices shown]
[im 21/59  brain]
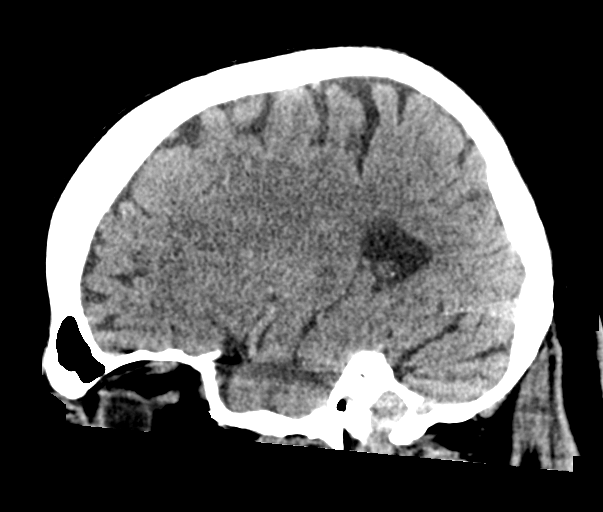
[im 30/59  brain]
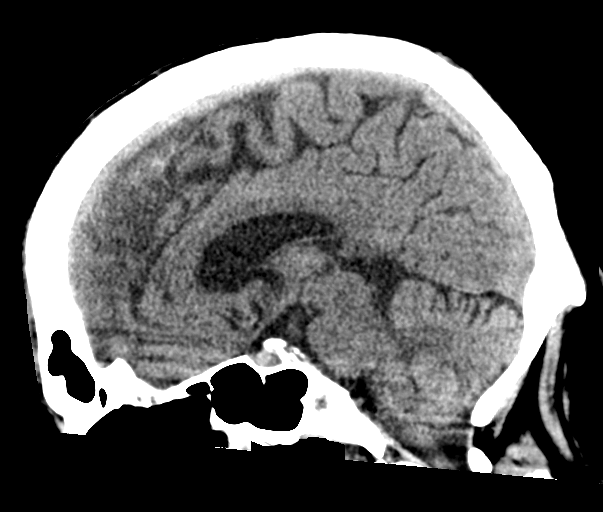
[im 39/59  brain]
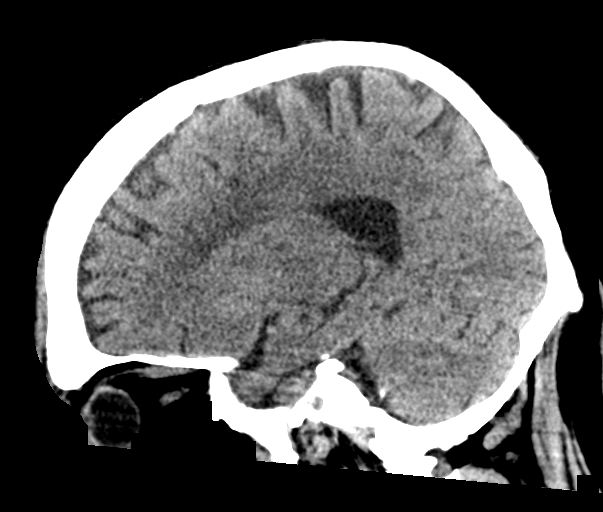

[15 of 47 positions shown; findings below may reference images not displayed]

FINDINGS: CT HEAD FINDINGS

Brain: No evidence of acute infarction, hemorrhage, extra-axial
collection, ventriculomegaly, or mass effect. Generalized cerebral
atrophy. Periventricular white matter low attenuation likely
secondary to microangiopathy.

Vascular: Cerebrovascular atherosclerotic calcifications are noted.

Skull: Negative for fracture or focal lesion.

Sinuses/Orbits: Visualized portions of the orbits are unremarkable.
Visualized portions of the paranasal sinuses are unremarkable.
Visualized portions of the mastoid air cells are unremarkable.

Other: None.

CT CERVICAL SPINE FINDINGS

Alignment: Normal.

Skull base and vertebrae: No acute fracture. No primary bone lesion
or focal pathologic process.

Soft tissues and spinal canal: No prevertebral fluid or swelling. No
visible canal hematoma.

Disc levels: Degenerative disease with disc height loss throughout
the cervical spine most severe at C5-6, C6-7 and C7-T1. Anterior
bridging osteophytes throughout the cervical spine. Right
uncovertebral degenerative changes C5-6 with foraminal encroachment.

Upper chest: Lung apices are clear.

Other: No fluid collection or hematoma.
IMPRESSION: 1. No acute intracranial pathology.
2. No acute osseous injury of the cervical spine.
3. Cervical spine spondylosis as described above.

## 2022-08-15 ENCOUNTER — Ambulatory Visit (INDEPENDENT_AMBULATORY_CARE_PROVIDER_SITE_OTHER): Payer: Medicare Other | Admitting: Allergy

## 2022-08-15 ENCOUNTER — Encounter: Payer: Self-pay | Admitting: Allergy

## 2022-08-15 VITALS — BP 100/60 | HR 60 | Temp 98.7°F | Resp 16 | Wt 322.0 lb

## 2022-08-15 DIAGNOSIS — L509 Urticaria, unspecified: Secondary | ICD-10-CM

## 2022-08-15 DIAGNOSIS — L299 Pruritus, unspecified: Secondary | ICD-10-CM | POA: Diagnosis not present

## 2022-08-15 NOTE — Progress Notes (Signed)
New Patient Note  RE: Bradley Griffin MRN: [id number] DOB: December 13, 1950 Date of Office Visit: 08/15/2022   Primary care provider: Seward Meth, DO  Chief Complaint: reaction  History of present illness: Bradley Griffin is a 72 y.o. male presenting today for evaluation of reaction.  He presents today with a worker from the living facility.   He states where he is living the medical team thinks he has an allergy to orange juice.  The OJ is a boxed concentrate OJ from brand [employer] that has ingrendients of: OJ from concentrate, apple juice from concentrate, filtered water, citric acid, ascorbic acid, yellow #5, yellow #6.   The worker states he developed hives.  He seems to break out into hives out of no where.  They thought it was the OJ and they did take away him drinking orange juice.  He started having hives towards the end of last year.  He has had they believe a total of about 3 episodes of hives.   He states when he was drinking OJ he was drinking it regularly however was not having hives regularly.   He states the rash/hives were itching and mostly on his arms.  He states the rash would typically be gone the same day.  He would get benadryl to help this.  Denies having any swelling.  Hives did not leave any bruising marks.  Denies having joint aches/pains with hives.  He does get zyrtec with his routine medications as well as famotidine.  He does eat eggs, toast, bacon, chicken, fish, pizza, potatoes, corn, carrots, green beans, peaches, apple sauce, fruit cocktail, dairy occasional peanut butter/jelly.  He drinks milk, coco-cola, cranberry juice, coffee, tea.   The worker states he did have blood drawn this morning for a basic food allergy panel ordered by the NP at the facility.  This has not returned by time of visit today.  I do not know exactly what the food panel test for.    He states he has been to allergist before and had testing to environmental allergens and tree pollen  was positive (done by Dr Carren Rang).  Work states this testing was also done because of the hives.  She states Dr Carren Rang does not do food allergy testing which prompted this visit today.   Review of systems in the past 4 weeks: Review of Systems  Constitutional: Negative.   HENT: Negative.    Eyes: Negative.   Respiratory: Negative.    Cardiovascular: Negative.   Musculoskeletal: Negative.   Skin: Negative.   Allergic/Immunologic: Negative.   Neurological: Negative.     All other systems negative unless noted above in HPI  Past medical history: Past Medical History:  Diagnosis Date   Basal cell carcinoma    Skin cancer    Blindness of left eye    Diabetes mellitus without complication ([place])    Squamous cell carcinoma in situ 2011    Past surgical history: Past Surgical History:  Procedure Laterality Date   CHOLECYSTECTOMY     TONSILLECTOMY      Family history:  Family History  Problem Relation Age of Onset   Leukemia Father     Social history: Lives in a assisted living facility.  No carpeting in facility.  Electric heating and central cooling. No pets.  No concern for water damage, mildew or roaches in the facility.  No smoking history.     Medication List: Current Outpatient Medications  Medication Sig Dispense Refill   acetaminophen (  TYLENOL) 325 MG tablet Take 325 mg by mouth every 4 (four) hours as needed.     alum & mag hydroxide-simeth (MAALOX/MYLANTA) 200-200-20 MG/5 SUSP Apply 1 Application topically.     citalopram (CELEXA) 20 MG tablet Take 20 mg by mouth daily.     cycloSPORINE (RESTASIS) 0.05 % ophthalmic emulsion Place 1 drop into the right eye 2 (two) times daily.     diphenhydrAMINE (BENADRYL) 25 mg capsule Take 25 mg by mouth every 6 (six) hours as needed.     divalproex (DEPAKOTE) 250 MG DR tablet Take 250 mg by mouth 3 (three) times daily.     EPINEPHrine (EPIPEN 2-PAK) 0.3 mg/0.3 mL IJ SOAJ injection Inject 0.3 mg into the muscle as needed for  anaphylaxis.     famotidine (PEPCID) 10 MG tablet Take 10 mg by mouth 2 (two) times daily.     lisinopril (ZESTRIL) 5 MG tablet Take 5 mg by mouth daily.     loperamide (IMODIUM A-D) 2 MG tablet Take 2 mg by mouth 3 (three) times daily as needed.     niacinamide 500 MG tablet Take 500 mg by mouth 2 (two) times daily with a meal.     nystatin cream (MYCOSTATIN) Apply topically 2 (two) times daily.     ofloxacin (OCUFLOX) 0.3 % ophthalmic solution Place 1 drop into the right eye 3 (three) times daily.     Skin Protectants, Misc. (EUCERIN) cream Apply topically as needed for dry skin.     No current facility-administered medications for this visit.    Known medication allergies: No Known Allergies   Physical examination: Blood pressure 100/60, pulse 60, temperature 98.7 F (37.1 C), temperature source Temporal, resp. rate 16, weight (!) 322 lb (146.1 kg), SpO2 98 %.  General: Alert, interactive, in no acute distress. HEENT: PERRLA, TMs pearly gray, turbinates non-edematous without discharge, post-pharynx non erythematous. Neck: Supple without lymphadenopathy. Lungs: Clear to auscultation without wheezing, rhonchi or rales. {no increased work of breathing. CV: Normal S1, S2 without murmurs. Abdomen: Nondistended, nontender. Skin: Ecchymosis on forearms . Extremities:  No clubbing, cyanosis or edema. Neuro:   Grossly intact.  Diagnositics/Labs: None today   Assessment and plan: Urticaria Pruritus - several episodes now of hives - at this time etiology of hives is unknown.  Hives can be caused by a variety of different triggers including illness/infection, foods, medications, stings, exercise, pressure, vibrations, extremes of temperature to name a few however majority of the time there is no identifiable trigger.  - recommend obtaining the following labwork to evaluate for recurrent hives: CBC w diff, CMP, tryptase, hive panel, environmental panel, alpha-gal panel, yellow dye IgE.    See lab requisition form - please fax lab results from the food allergy panel drawn today 08/15/22.   This will determine if additional testing needs to be done - should significant symptoms recur or new symptoms occur, a journal is to be kept recording any foods eaten, beverages consumed, medications taken, activities performed, and environmental conditions within a 6 hour time period prior to the onset of symptoms.  - should hives return recommend taking the following: Zyrtec 10mg  tab twice a day with Pepcid 10mg  1 tab twice a day until hive free  Follow-up in 2-3 months or sooner if needed  I appreciate the opportunity to take part in Nasire's care. Please do not hesitate to contact me with questions.  Sincerely,   Prudy Feeler, MD Allergy/Immunology [facility]

## 2022-08-15 NOTE — Patient Instructions (Addendum)
Hives - several episodes now of hives - at this time etiology of hives is unknown.  Hives can be caused by a variety of different triggers including illness/infection, foods, medications, stings, exercise, pressure, vibrations, extremes of temperature to name a few however majority of the time there is no identifiable trigger.  - recommend obtaining the following labwork to evaluate for recurrent hives: CBC w diff, CMP, tryptase, hive panel, environmental panel, alpha-gal panel, yellow dye IgE.   See lab requisition form - please fax lab results from the food allergy panel drawn today 08/15/22.   This will determine if additional testing needs to be done - should significant symptoms recur or new symptoms occur, a journal is to be kept recording any foods eaten, beverages consumed, medications taken, activities performed, and environmental conditions within a 6 hour time period prior to the onset of symptoms.  - should hives return recommend taking the following: Zyrtec 10mg  tab twice a day with Pepcid 10mg  1 tab twice a day until hive free  Follow-up in 2-3 months or sooner if needed

## 2022-08-22 ENCOUNTER — Ambulatory Visit: Payer: Self-pay | Admitting: Allergy

## 2022-09-01 ENCOUNTER — Other Ambulatory Visit: Payer: Self-pay | Admitting: Allergy

## 2022-09-04 LAB — HGB A1C W/O EAG: Hgb A1c MFr Bld: 6 % — ABNORMAL HIGH (ref 4.8–5.6)

## 2022-09-04 LAB — ALPHA-GAL PANEL

## 2022-09-04 LAB — ALLERGENS W/TOTAL IGE AREA 2

## 2022-09-04 LAB — THYROID ANTIBODIES: Thyroperoxidase Ab SerPl-aCnc: <9 IU/mL (ref 0–34)

## 2022-09-05 LAB — ALLERGENS W/TOTAL IGE AREA 2

## 2022-09-05 LAB — ALPHA-GAL PANEL

## 2022-09-07 LAB — CHRONIC URTICARIA

## 2022-09-07 LAB — ALLERGENS W/TOTAL IGE AREA 2

## 2022-09-07 LAB — TRYPTASE: Tryptase: 7.6 ug/L (ref 2.2–13.2)

## 2022-09-08 LAB — ALLERGEN, ANNATTO SEED

## 2022-09-08 LAB — ALLERGENS W/TOTAL IGE AREA 2
Cat Dander IgE: <0.1 kU/L
Pecan, Hickory IgE: 0.18 kU/L — AB
White Mulberry IgE: <0.1 kU/L

## 2022-09-13 LAB — ALLERGENS W/TOTAL IGE AREA 2
Bermuda Grass IgE: <0.1 kU/L
Common Silver Birch IgE: 0.26 kU/L — AB
Elm, American IgE: 0.13 kU/L — AB
Johnson Grass IgE: <0.1 kU/L
Mouse Urine IgE: <0.1 kU/L
Oak, White IgE: 0.85 kU/L — AB
Pigweed, Rough IgE: <0.1 kU/L

## 2022-09-13 LAB — THYROID ANTIBODIES: Thyroglobulin Antibody: <1 IU/mL (ref 0.0–0.9)

## 2022-09-13 LAB — CBC WITH DIFFERENTIAL
Basophils Absolute: 0 10*3/uL (ref 0.0–0.2)
Basos: 1 %
EOS (ABSOLUTE): 0.2 10*3/uL (ref 0.0–0.4)
Eos: 4 %
Hematocrit: 39.6 % (ref 37.5–51.0)
Hemoglobin: 13.3 g/dL (ref 13.0–17.7)
Immature Grans (Abs): 0 10*3/uL (ref 0.0–0.1)
Immature Granulocytes: 0 %
Lymphocytes Absolute: 1.5 10*3/uL (ref 0.7–3.1)
Lymphs: 27 %
MCH: 32.5 pg (ref 26.6–33.0)
MCHC: 33.6 g/dL (ref 31.5–35.7)
MCV: 97 fL (ref 79–97)
Monocytes Absolute: 0.4 10*3/uL (ref 0.1–0.9)
Monocytes: 8 %
Neutrophils Absolute: 3.4 10*3/uL (ref 1.4–7.0)
Neutrophils: 60 %
RBC: 4.09 x10E6/uL — ABNORMAL LOW (ref 4.14–5.80)
RDW: 12.1 % (ref 11.6–15.4)
WBC: 5.5 10*3/uL (ref 3.4–10.8)

## 2022-09-13 LAB — COMPREHENSIVE METABOLIC PANEL
ALT: 17 IU/L (ref 0–44)
AST: 17 IU/L (ref 0–40)
Albumin/Globulin Ratio: 1.1 — ABNORMAL LOW (ref 1.2–2.2)
Albumin: 3.8 g/dL — ABNORMAL LOW (ref 3.9–4.9)
Alkaline Phosphatase: 76 IU/L (ref 44–121)
BUN/Creatinine Ratio: 20 (ref 10–24)
BUN: 18 mg/dL (ref 8–27)
Bilirubin Total: 0.4 mg/dL (ref 0.0–1.2)
CO2: 27 mmol/L (ref 20–29)
Calcium: 8.6 mg/dL (ref 8.6–10.2)
Chloride: 100 mmol/L (ref 96–106)
Creatinine, Ser: 0.88 mg/dL (ref 0.76–1.27)
Globulin, Total: 3.4 g/dL (ref 1.5–4.5)
Glucose: 98 mg/dL (ref 70–99)
Potassium: 4.2 mmol/L (ref 3.5–5.2)
Sodium: 140 mmol/L (ref 134–144)
Total Protein: 7.2 g/dL (ref 6.0–8.5)
eGFR: 93 mL/min/{1.73_m2} (ref >59)

## 2022-09-13 LAB — ALPHA-GAL PANEL
Beef IgE: <0.1 kU/L
O215-IgE Alpha-Gal: <0.1 kU/L
Pork IgE: <0.1 kU/L

## 2022-09-13 LAB — TSH: TSH: 2.96 u[IU]/mL (ref 0.450–4.500)

## 2022-09-13 LAB — ALLERGEN, ANNATTO SEED: Annatto Seed IgE*: <0.35 kU/L (ref <0.35)

## 2022-10-03 ENCOUNTER — Ambulatory Visit: Payer: Medicare Other | Admitting: Allergy

## 2022-10-26 ENCOUNTER — Encounter: Payer: Self-pay | Admitting: *Deleted

## 2022-10-31 ENCOUNTER — Ambulatory Visit (INDEPENDENT_AMBULATORY_CARE_PROVIDER_SITE_OTHER): Payer: Medicare Other | Admitting: Allergy

## 2022-10-31 VITALS — BP 138/70 | HR 50 | Resp 18

## 2022-10-31 DIAGNOSIS — L299 Pruritus, unspecified: Secondary | ICD-10-CM | POA: Diagnosis not present

## 2022-10-31 DIAGNOSIS — L509 Urticaria, unspecified: Secondary | ICD-10-CM

## 2022-10-31 NOTE — Progress Notes (Unsigned)
Follow-up Note  RE: Bradley Griffin MRN: [id number] DOB: 1950-11-28 Date of Office Visit: 10/31/2022   History of present illness: Bradley Griffin is a 72 y.o. male presenting today for follow-up of hives.  He was last seen in the office on 08/15/22 by myself.  He presents today with his care worker from his facility. He states he has not had any further episodes of hives since the last visit.  He has been avoiding the juice drinks at the facility.  He states he even avoids the pineapple juice.  States he has not had any further hives or itching episodes he is not had any need to start taking Zyrtec or the Pepcid.  They do list them for as needed use on his medication page. 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  Review of systems: Review of Systems  Constitutional: Negative.   HENT: Negative.    Eyes:  Positive for redness.  Respiratory: Negative.    Cardiovascular: Negative.   Musculoskeletal: Negative.   Skin: Negative.   Allergic/Immunologic: Negative.   Neurological: Negative.      All other systems negative unless noted above in HPI  Past medical/social/surgical/family history have been reviewed and are unchanged unless specifically indicated below.  No changes  Medication List: Current Outpatient Medications  Medication Sig Dispense Refill   acetaminophen (TYLENOL) 325 MG tablet Take 325 mg by mouth every 4 (four) hours as needed.     alum & mag hydroxide-simeth (MAALOX/MYLANTA) 200-200-20 MG/5 SUSP Apply 1 Application topically.     citalopram (CELEXA) 20 MG tablet Take 20 mg by mouth daily.     cycloSPORINE (RESTASIS) 0.05 % ophthalmic emulsion Place 1 drop into the right eye 2 (two) times daily.     diphenhydrAMINE (BENADRYL) 25 mg capsule Take 25 mg by mouth every 6 (six) hours as needed.     divalproex (DEPAKOTE) 250 MG DR tablet Take 250 mg by mouth 3 (three) times daily.     EPINEPHrine (EPIPEN 2-PAK)  0.3 mg/0.3 mL IJ SOAJ injection Inject 0.3 mg into the muscle as needed for anaphylaxis.     erythromycin ophthalmic ointment SMARTSIG:In Eye(s)     famotidine (PEPCID) 10 MG tablet Take 10 mg by mouth 2 (two) times daily.     lisinopril (ZESTRIL) 5 MG tablet Take 5 mg by mouth daily.     loperamide (IMODIUM A-D) 2 MG tablet Take 2 mg by mouth 3 (three) times daily as needed.     naltrexone (DEPADE) 50 MG tablet Take 50 mg by mouth daily.     niacinamide 500 MG tablet Take 500 mg by mouth 2 (two) times daily with a meal.     nystatin cream (MYCOSTATIN) Apply topically 2 (two) times daily.     ofloxacin (OCUFLOX) 0.3 % ophthalmic solution Place 1 drop into the right eye 3 (three) times daily.     Skin Protectants, Misc. (EUCERIN) cream Apply topically as needed for dry skin.     triamcinolone cream (KENALOG) 0.5 % Apply topically.     No current facility-administered medications for this visit.     Known medication allergies: No Known Allergies   Physical examination: Blood pressure 138/70, pulse (!) 50, resp. rate 18, SpO2 95 %.  General: Alert, interactive, in no acute distress. HEENT: Right eye with erythema of the sclera as well as some erythema of the upper and lower lids laterally, TMs pearly gray, turbinates non-edematous without discharge,  post-pharynx non erythematous. Neck: Supple without lymphadenopathy. Lungs: Clear to auscultation without wheezing, rhonchi or rales. {no increased work of breathing. CV: Normal S1, S2 without murmurs. Abdomen: Nondistended, nontender. Skin: Warm and dry, without lesions or rashes. Extremities:  No clubbing, cyanosis or edema. Neuro:   Grossly intact.  Diagnositics/Labs: Labs:  Component     Latest Ref Rng 09/01/2022  D Pteronyssinus IgE     Class 0 kU/L <0.10   D Farinae IgE     Class 0 kU/L <0.10   Cat Dander IgE     Class 0 kU/L <0.10   Dog Dander IgE     Class 0 kU/L <0.10   [place] Grass IgE     Class 0 kU/L <0.10   Timothy  Grass IgE     Class 0 kU/L <0.10   [place] Grass IgE     Class 0 kU/L <0.10   Cockroach, German IgE     Class 0 kU/L <0.10   Penicillium Chrysogen IgE     Class 0 kU/L <0.10   Cladosporium Herbarum IgE     Class 0 kU/L <0.10   Aspergillus Fumigatus IgE     Class 0 kU/L <0.10   Alternaria Alternata IgE     Class 0 kU/L <0.10   Maple/Box Elder IgE     Class 0 kU/L <0.10   Common Silver Charletta Cousin IgE     Class 0/I kU/L 0.26 !   El[place], [place] IgE     Class 0 kU/L <0.10   Oak, [place] IgE     Class II kU/L 0.85 !   Elm, American IgE     Class 0/I kU/L 0.13 !   [place] IgE     Class 0 kU/L <0.10   Pecan, [place]ory IgE     Class 0/I kU/L 0.18 !   White Mulberry IgE     Class 0 kU/L <0.10   Ragweed, Short IgE     Class 0 kU/L <0.10   Pigweed, Rough IgE     Class 0 kU/L <0.10   Sheep Sorrel IgE Qn     Class 0 kU/L <0.10   Mouse Urine IgE     Class 0 kU/L <0.10   WBC     3.4 - 10.8 x10E3/uL 5.5   RBC     4.14 - 5.80 x10E6/uL 4.09 (L)   Hemoglobin     13.0 - 17.7 g/dL 40.9   HCT     81.1 - 91.4 % 39.6   MCV     79 - 97 fL 97   MCH     26.6 - 33.0 pg 32.5   MCHC     31.5 - 35.7 g/dL 78.2   RDW     95.6 - 21.3 % 12.1   Neutrophils     Not Estab. % 60   Lymphs     Not Estab. % 27   Monocytes     Not Estab. % 8   Eos     Not Estab. % 4   Basos     Not Estab. % 1   NEUT#     1.4 - 7.0 x10E3/uL 3.4   Lymphocyte #     0.7 - 3.1 x10E3/uL 1.5   Monocytes Absolute     0.1 - 0.9 x10E3/uL 0.4   EOS (ABSOLUTE)     0.0 - 0.4 x10E3/uL 0.2   Basophils Absolute     0.0 - 0.2 x10E3/uL 0.0   Immature Granulocytes  Not Estab. % 0   Immature Grans (Abs)     0.0 - 0.1 x10E3/uL 0.0   Glucose     70 - 99 mg/dL 98   BUN     8 - 27 mg/dL 18   Creatinine     1.61 - 1.27 mg/dL 0.96   eGFR     >04 VW/UJW/1.19 93   BUN/Creatinine Ratio     10 - 24  20   Sodium     134 - 144 mmol/L 140   Potassium     3.5 - 5.2 mmol/L 4.2   Chloride     96 - 106 mmol/L 100   CO2      20 - 29 mmol/L 27   Calcium     8.6 - 10.2 mg/dL 8.6   Total Protein     6.0 - 8.5 g/dL 7.2   Albumin     3.9 - 4.9 g/dL 3.8 (L)   Globulin, Total     1.5 - 4.5 g/dL 3.4   Albumin/Globulin Ratio     1.2 - 2.2  1.1 (L)   Total Bilirubin     0.0 - 1.2 mg/dL 0.4   Alkaline Phosphatase     44 - 121 IU/L 76   AST     0 - 40 IU/L 17   ALT     0 - 44 IU/L 17   Class Description Allergens Comment   IgE (Immunoglobulin E), Serum     6 - 495 IU/mL 315   [id number] Alpha-Gal     Class 0 kU/L <0.10   Beef IgE     Class 0 kU/L <0.10   Pork IgE     Class 0 kU/L <0.10   Allergen Lamb IgE     Class 0 kU/L <0.10   Annatto Seed IgE*     <0.35 kU/L <0.35   Class Interpretation 0   Thyroperoxidase Ab SerPl-aCnc     0 - 34 IU/mL <9   Thyroglobulin Antibody     0.0 - 0.9 IU/mL <1.0   cu index     <10  <5.0   Hemoglobin A1C     4.8 - 5.6 % 6.0 (H)   TSH     0.450 - 4.500 uIU/mL 2.960   Tryptase     2.2 - 13.2 ug/L 7.6     Assessment and plan: 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  Hives can be caused by a variety of different triggers including illness/infection, foods, medications, stings, exercise, pressure, vibrations, extremes of temperature to name a few however majority of the time there is no identifiable trigger.  - should significant symptoms recur or new symptoms occur, a journal is to be kept recording any foods eaten, beverages consumed, medications taken, activities performed, and environmental conditions within a 6 hour time period prior to the onset of symptoms.  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   - if you have having symptoms related to tree  pollen exposure like sneezing, itchy/watery eyes, itchy nose, nasal congestion/drainage etc can take Zyrtec daily as needed for this.    Follow-up in 6  months or sooner if needed  I appreciate the opportunity to take part in Bradley Griffin's care. Please do not hesitate to contact me with questions.  Sincerely,   Margo Aye, MD Allergy/Immunology [facility]

## 2022-10-31 NOTE — Patient Instructions (Addendum)
Hives - has been hive free since March 2024 - at this time etiology of hives is likely allergic from ingredient in the boxed juice that I do not have testing for.  Hives can be caused by a variety of different triggers including illness/infection, foods, medications, stings, exercise, pressure, vibrations, extremes of temperature to name a few however majority of the time there is no identifiable trigger.  - should significant symptoms recur or new symptoms occur, a journal is to be kept recording any foods eaten, beverages consumed, medications taken, activities performed, and environmental conditions within a 6 hour time period prior to the onset of symptoms.  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   - if you have having symptoms related to tree pollen exposure like sneezing, itchy/watery eyes, itchy nose, nasal congestion/drainage etc can take Zyrtec daily as needed for this.    Follow-up in 6 months or sooner if needed

## 2022-11-01 ENCOUNTER — Encounter: Payer: Self-pay | Admitting: Allergy

## 2023-05-01 ENCOUNTER — Ambulatory Visit: Payer: Medicare Other | Admitting: Allergy

## 2023-05-01 ENCOUNTER — Ambulatory Visit (INDEPENDENT_AMBULATORY_CARE_PROVIDER_SITE_OTHER): Payer: Medicare Other | Admitting: Allergy

## 2023-05-01 ENCOUNTER — Encounter: Payer: Self-pay | Admitting: Allergy

## 2023-05-01 VITALS — BP 110/72 | HR 68 | Resp 22

## 2023-05-01 DIAGNOSIS — L299 Pruritus, unspecified: Secondary | ICD-10-CM | POA: Diagnosis not present

## 2023-05-01 DIAGNOSIS — L509 Urticaria, unspecified: Secondary | ICD-10-CM | POA: Diagnosis not present

## 2023-05-01 NOTE — Progress Notes (Signed)
Follow-up Note  RE: Bradley Griffin MRN: [id number] DOB: 1950/12/12 Date of Office Visit: 05/01/2023   History of present illness: Bradley Griffin is a 72 y.o. male presenting today for follow-up of urticaria and pruritus.  He was last seen in the office on 10/31/2022 myself.  He presents today with his care worker from his facility.  Discussed the use of AI scribe software for clinical note transcription with the patient, who gave verbal consent to proceed.  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 He has not experienced any hives or itching, and has been successfully avoiding the juice product that initially caused the allergic reaction. The patient has been prescribed Zyrtec and Pepcid as needed for any potential hives or itching, but has not needed to take these medications since the last visit due to the absence of these symptoms.  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 However, he has not experienced any symptoms related to this allergy, such as sneezing, itchy or watery eyes, or a runny or stuffy nose.   The patient has not reported any new health issues or concerns since the last visit.   Review of systems: 10pt ROS negative unless noted above in HPI  All other systems negative unless noted above in HPI  Past medical/social/surgical/family history have been reviewed and are unchanged unless specifically indicated below.  No changes  Medication List: Current Outpatient Medications  Medication Sig Dispense Refill   acetaminophen (TYLENOL) 325 MG tablet Take 325 mg by mouth every 4 (four) hours as needed.     alum & mag hydroxide-simeth (MAALOX/MYLANTA) 200-200-20 MG/5 SUSP Apply 1 Application topically.     cetirizine (ZYRTEC) 10 MG tablet Take 10 mg by mouth daily.     chlorhexidine (PERIDEX) 0.12 % solution Use as directed 15  mLs in the mouth or throat 2 (two) times daily.     citalopram (CELEXA) 20 MG tablet Take 20 mg by mouth daily.     cycloSPORINE (RESTASIS) 0.05 % ophthalmic emulsion Place 1 drop into the right eye 2 (two) times daily.     diphenhydrAMINE (BENADRYL) 25 mg capsule Take 25 mg by mouth every 6 (six) hours as needed.     divalproex (DEPAKOTE) 250 MG DR tablet Take 250 mg by mouth 3 (three) times daily.     EPINEPHrine (EPIPEN 2-PAK) 0.3 mg/0.3 mL IJ SOAJ injection Inject 0.3 mg into the muscle as needed for anaphylaxis.     erythromycin ophthalmic ointment SMARTSIG:In Eye(s)     famotidine (PEPCID) 10 MG tablet Take 10 mg by mouth 2 (two) times daily.     lisinopril (ZESTRIL) 5 MG tablet Take 5 mg by mouth daily.     loperamide (IMODIUM A-D) 2 MG tablet Take 2 mg by mouth 3 (three) times daily as needed.     naltrexone (DEPADE) 50 MG tablet Take 50 mg by mouth daily.     niacinamide 500 MG tablet Take 500 mg by mouth 2 (two) times daily with a meal.     nystatin cream (MYCOSTATIN) Apply topically 2 (two) times daily.     ofloxacin (OCUFLOX) 0.3 % ophthalmic solution Place 1 drop into the right eye 3 (three) times daily.     Skin Protectants, Misc. (EUCERIN) cream Apply topically as needed for dry skin.     triamcinolone cream (KENALOG) 0.5 %  Apply topically.     No current facility-administered medications for this visit.     Known medication allergies: No Known Allergies   Physical examination: Blood pressure 110/72, pulse 68, resp. rate (!) 22, SpO2 99%.  General: Alert, interactive, in no acute distress. HEENT: PERRLA, TMs pearly gray, turbinates non-edematous without discharge, post-pharynx non erythematous. Neck: Supple without lymphadenopathy. Lungs: Clear to auscultation without wheezing, rhonchi or rales. {no increased work of breathing. CV: Normal S1, S2 without murmurs. Abdomen: Nondistended, nontender. Skin: Warm and dry, without lesions or rashes. Extremities:  No clubbing,  cyanosis or edema. Neuro:   Grossly intact.  Diagnositics/Labs: None today   Assessment and plan: Hives/itching - food reaction - has been hive free since March 2024 - at this time etiology of hives is likely allergic from ingredient in the boxed juice that I do not have testing for.  Hives can be caused by a variety of different triggers including illness/infection, foods, medications, stings, exercise, pressure, vibrations, extremes of temperature to name a few however majority of the time there is no identifiable trigger.  - should significant symptoms recur or new symptoms occur, a journal is to be kept recording any foods eaten, beverages consumed, medications taken, activities performed, and environmental conditions within a 6 hour time period prior to the onset of symptoms.  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   - if you have having symptoms related to tree pollen exposure like sneezing, itchy/watery eyes, itchy nose, nasal congestion/drainage etc can take Zyrtec daily as needed for this.    Follow-up as needed.   I appreciate the opportunity to take part in Bradley Griffin's care. Please do not hesitate to contact me with questions.  Sincerely,   Margo Aye, MD Allergy/Immunology [facility]

## 2023-05-01 NOTE — Patient Instructions (Addendum)
Hives/itching - food reaction - has been hive free since March 2024 - at this time etiology of hives is likely allergic from ingredient in the boxed juice that I do not have testing for.  Hives can be caused by a variety of different triggers including illness/infection, foods, medications, stings, exercise, pressure, vibrations, extremes of temperature to name a few however majority of the time there is no identifiable trigger.  - should significant symptoms recur or new symptoms occur, a journal is to be kept recording any foods eaten, beverages consumed, medications taken, activities performed, and environmental conditions within a 6 hour time period prior to the onset of symptoms.  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   - if you have having symptoms related to tree pollen exposure like sneezing, itchy/watery eyes, itchy nose, nasal congestion/drainage etc can take Zyrtec daily as needed for this.    Follow-up as needed.
